# Patient Record
Sex: Female | Born: 1970 | Race: Black or African American | Hispanic: No | Marital: Single | Smoking: Never smoker
Health system: Southern US, Community
[De-identification: ages and names within clinical notes are randomized; demographics above are authoritative.]

## PROBLEM LIST (undated history)

## (undated) DIAGNOSIS — R054 Cough syncope: Secondary | ICD-10-CM

## (undated) DIAGNOSIS — I10 Essential (primary) hypertension: Secondary | ICD-10-CM

## (undated) DIAGNOSIS — G4730 Sleep apnea, unspecified: Secondary | ICD-10-CM

## (undated) DIAGNOSIS — E785 Hyperlipidemia, unspecified: Secondary | ICD-10-CM

## (undated) DIAGNOSIS — E119 Type 2 diabetes mellitus without complications: Secondary | ICD-10-CM

## (undated) DIAGNOSIS — R05 Cough: Secondary | ICD-10-CM

## (undated) DIAGNOSIS — D649 Anemia, unspecified: Secondary | ICD-10-CM

## (undated) DIAGNOSIS — R55 Syncope and collapse: Secondary | ICD-10-CM

## (undated) HISTORY — DX: Sleep apnea, unspecified: G47.30

## (undated) HISTORY — DX: Morbid (severe) obesity due to excess calories: E66.01

## (undated) HISTORY — PX: BREAST SURGERY: SHX581

## (undated) HISTORY — DX: Essential (primary) hypertension: I10

## (undated) HISTORY — PX: CHOLECYSTECTOMY: SHX55

## (undated) HISTORY — PX: ABDOMINAL HYSTERECTOMY: SHX81

## (undated) HISTORY — DX: Type 2 diabetes mellitus without complications: E11.9

## (undated) HISTORY — DX: Syncope and collapse: R55

## (undated) HISTORY — DX: Cough syncope: R05.4

## (undated) HISTORY — DX: Hyperlipidemia, unspecified: E78.5

## (undated) HISTORY — DX: Anemia, unspecified: D64.9

---
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
## 2002-03-02 ENCOUNTER — Emergency Department (HOSPITAL_COMMUNITY): Admission: EM | Admit: 2002-03-02 | Discharge: 2002-03-02 | Payer: Self-pay | Admitting: Emergency Medicine

## 2002-06-18 ENCOUNTER — Encounter: Payer: Self-pay | Admitting: Emergency Medicine

## 2002-06-18 ENCOUNTER — Emergency Department (HOSPITAL_COMMUNITY): Admission: EM | Admit: 2002-06-18 | Discharge: 2002-06-18 | Payer: Self-pay | Admitting: Emergency Medicine

## 2005-01-13 ENCOUNTER — Ambulatory Visit: Payer: Self-pay | Admitting: Internal Medicine

## 2005-03-28 ENCOUNTER — Emergency Department (HOSPITAL_COMMUNITY): Admission: EM | Admit: 2005-03-28 | Discharge: 2005-03-29 | Payer: Self-pay | Admitting: Emergency Medicine

## 2007-08-17 ENCOUNTER — Emergency Department (HOSPITAL_COMMUNITY): Admission: EM | Admit: 2007-08-17 | Discharge: 2007-08-17 | Payer: Self-pay | Admitting: Family Medicine

## 2007-10-12 ENCOUNTER — Ambulatory Visit: Payer: Self-pay | Admitting: *Deleted

## 2008-01-28 ENCOUNTER — Emergency Department (HOSPITAL_COMMUNITY): Admission: EM | Admit: 2008-01-28 | Discharge: 2008-01-29 | Payer: Self-pay | Admitting: Emergency Medicine

## 2010-03-02 ENCOUNTER — Emergency Department (HOSPITAL_COMMUNITY): Admission: EM | Admit: 2010-03-02 | Discharge: 2010-03-02 | Payer: Self-pay | Admitting: Family Medicine

## 2011-03-11 LAB — INFLUENZA A AND B ANTIGEN (CONVERTED LAB)
Inflenza A Ag: NEGATIVE
Influenza B Ag: NEGATIVE

## 2011-03-11 LAB — POCT RAPID STREP A: Streptococcus, Group A Screen (Direct): NEGATIVE

## 2011-05-31 ENCOUNTER — Other Ambulatory Visit: Payer: Self-pay | Admitting: Family Medicine

## 2011-05-31 DIAGNOSIS — R1900 Intra-abdominal and pelvic swelling, mass and lump, unspecified site: Secondary | ICD-10-CM

## 2011-06-03 ENCOUNTER — Ambulatory Visit
Admission: RE | Admit: 2011-06-03 | Discharge: 2011-06-03 | Disposition: A | Discharge status: Home / Self Care | Payer: BC Managed Care – PPO | Source: Ambulatory Visit | Attending: Family Medicine | Admitting: Family Medicine

## 2011-06-03 DIAGNOSIS — R1900 Intra-abdominal and pelvic swelling, mass and lump, unspecified site: Secondary | ICD-10-CM

## 2011-08-01 ENCOUNTER — Other Ambulatory Visit (HOSPITAL_COMMUNITY)
Admission: RE | Admit: 2011-08-01 | Discharge: 2011-08-01 | Disposition: A | Discharge status: Home / Self Care | Payer: BC Managed Care – PPO | Source: Ambulatory Visit | Attending: Obstetrics and Gynecology | Admitting: Obstetrics and Gynecology

## 2011-08-01 DIAGNOSIS — Z01419 Encounter for gynecological examination (general) (routine) without abnormal findings: Secondary | ICD-10-CM | POA: Insufficient documentation

## 2012-08-15 DIAGNOSIS — D259 Leiomyoma of uterus, unspecified: Secondary | ICD-10-CM | POA: Insufficient documentation

## 2012-08-15 DIAGNOSIS — Z531 Procedure and treatment not carried out because of patient's decision for reasons of belief and group pressure: Secondary | ICD-10-CM | POA: Insufficient documentation

## 2012-08-15 DIAGNOSIS — IMO0001 Reserved for inherently not codable concepts without codable children: Secondary | ICD-10-CM | POA: Insufficient documentation

## 2012-08-15 DIAGNOSIS — N920 Excessive and frequent menstruation with regular cycle: Secondary | ICD-10-CM | POA: Insufficient documentation

## 2012-08-15 DIAGNOSIS — D649 Anemia, unspecified: Secondary | ICD-10-CM | POA: Insufficient documentation

## 2012-09-24 ENCOUNTER — Other Ambulatory Visit: Payer: Self-pay | Admitting: Obstetrics and Gynecology

## 2012-09-24 ENCOUNTER — Other Ambulatory Visit (HOSPITAL_COMMUNITY)
Admission: RE | Admit: 2012-09-24 | Discharge: 2012-09-24 | Disposition: A | Discharge status: Home / Self Care | Payer: BC Managed Care – PPO | Source: Ambulatory Visit | Attending: Obstetrics and Gynecology | Admitting: Obstetrics and Gynecology

## 2012-09-24 DIAGNOSIS — Z01419 Encounter for gynecological examination (general) (routine) without abnormal findings: Secondary | ICD-10-CM | POA: Insufficient documentation

## 2012-09-24 DIAGNOSIS — Z1151 Encounter for screening for human papillomavirus (HPV): Secondary | ICD-10-CM | POA: Insufficient documentation

## 2012-12-20 ENCOUNTER — Telehealth: Payer: Self-pay | Admitting: Hematology and Oncology

## 2012-12-20 NOTE — Telephone Encounter (Signed)
S/W PT IN RE TO NP APPT 07/30 @ 1:30 W/DR. [place] REFERRING DR. Dion Body DX- IDA WELCOME PACKET MAILED.   REFERRING OFFICE IS AWARE OF APPT.

## 2012-12-24 ENCOUNTER — Telehealth: Payer: Self-pay | Admitting: Hematology and Oncology

## 2012-12-24 NOTE — Telephone Encounter (Signed)
C/D 12/24/12 for appt. 01/16/13

## 2013-01-15 ENCOUNTER — Other Ambulatory Visit: Payer: Self-pay

## 2013-01-15 DIAGNOSIS — D649 Anemia, unspecified: Secondary | ICD-10-CM

## 2013-01-16 ENCOUNTER — Telehealth: Payer: Self-pay | Admitting: *Deleted

## 2013-01-16 ENCOUNTER — Ambulatory Visit: Payer: BC Managed Care – PPO

## 2013-01-16 ENCOUNTER — Ambulatory Visit: Payer: BC Managed Care – PPO | Admitting: Lab

## 2013-01-16 ENCOUNTER — Ambulatory Visit (HOSPITAL_BASED_OUTPATIENT_CLINIC_OR_DEPARTMENT_OTHER): Payer: BC Managed Care – PPO | Admitting: Hematology and Oncology

## 2013-01-16 ENCOUNTER — Other Ambulatory Visit: Payer: BC Managed Care – PPO | Admitting: Lab

## 2013-01-16 ENCOUNTER — Telehealth: Payer: Self-pay

## 2013-01-16 ENCOUNTER — Other Ambulatory Visit (HOSPITAL_BASED_OUTPATIENT_CLINIC_OR_DEPARTMENT_OTHER): Payer: BC Managed Care – PPO | Admitting: Lab

## 2013-01-16 VITALS — BP 163/94 | HR 69 | Temp 98.1°F | Resp 18 | Ht 66.0 in | Wt 203.7 lb

## 2013-01-16 DIAGNOSIS — D649 Anemia, unspecified: Secondary | ICD-10-CM

## 2013-01-16 LAB — TSH CHCC: TSH: 1.017 m(IU)/L (ref 0.308–3.960)

## 2013-01-16 LAB — CBC WITH DIFFERENTIAL/PLATELET
Basophils Absolute: 0 10*3/uL (ref 0.0–0.1)
EOS%: 1.4 % (ref 0.0–7.0)
HCT: 35.1 % (ref 34.8–46.6)
HGB: 10.6 g/dL — ABNORMAL LOW (ref 11.6–15.9)
LYMPH%: 41.1 % (ref 14.0–49.7)
MCH: 22.8 pg — ABNORMAL LOW (ref 25.1–34.0)
MCV: 75.6 fL — ABNORMAL LOW (ref 79.5–101.0)
MONO%: 7.6 % (ref 0.0–14.0)
NEUT%: 49.3 % (ref 38.4–76.8)
Platelets: 341 10*3/uL (ref 145–400)

## 2013-01-16 LAB — COMPREHENSIVE METABOLIC PANEL (CC13)
ALT: 7 U/L (ref 0–55)
CO2: 25 mEq/L (ref 22–29)
Calcium: 9.1 mg/dL (ref 8.4–10.4)
Chloride: 108 mEq/L (ref 98–109)
Sodium: 141 mEq/L (ref 136–145)
Total Protein: 7.6 g/dL (ref 6.4–8.3)

## 2013-01-16 LAB — RETICULOCYTES: RBC: 4.6 10*6/uL (ref 3.70–5.45)

## 2013-01-16 LAB — T4, FREE: Free T4: 1.09 ng/dL (ref 0.80–1.80)

## 2013-01-16 NOTE — Telephone Encounter (Signed)
appts made and printed. Pt is aware on 01/30/13 she will have an tx after seeing the doctor. i emailed MW to add the tx...td

## 2013-01-16 NOTE — Progress Notes (Signed)
[facility]  Telephone:([phone] Fax:(336) [phone]   MEDICAL ONCOLOGY - INITIAL CONSULATION    Referral MD: Anemia    No chief complaint on file. :Anemia  HPI:  We have the pleasure of seeing Audrey Avery in our clinic today for consultation regarding her microcytic anemia. Audrey Avery is a pleasant 42 years old, Jehovah's Witness African American female with normal significant medical history except for heavy menses which has been getting worse lately due to worsening fibroid. Patient says that she has been having excessive amounts of vaginal bleeding during her menses, the duration is getting more and more prolonged now. Patient stated that his feeding tired and fatigue. She has been on Integra once a day. Audrey Avery underwent a vaginal ultrasound which should multiple uterine fibroids some of which, were growing in size.    History reviewed. No pertinent past medical history.:  History reviewed. No pertinent past surgical history.:  Current Outpatient Prescriptions  Medication Sig Dispense Refill  . acetaminophen (TYLENOL) 500 MG tablet Take 500 mg by mouth. Prn for abd cramps      . Cholecalciferol (VITAMIN D3) 2000 UNITS CHEW Chew 2 tablets by mouth daily. gummies      . Fe Fum-FePoly-Vit C-Vit B3 (INTEGRA PO) Take 1 tablet by mouth daily.      Marland Kitchen leuprolide (LUPRON) 11.25 MG injection Inject 11.25 mg into the muscle every 3 (three) months. Last in May      . lisinopril (PRINIVIL,ZESTRIL) 20 MG tablet Take 20 mg by mouth daily.       No current facility-administered medications for this visit.     Allergies  Allergen Reactions  . Penicillins Swelling    Throat swelling per mother  :  History reviewed. No pertinent family history.:  History   Social History  . Marital Status: Single    Spouse Name: N/A    Number of Children: N/A  . Years of Education: N/A   Occupational History  . Not on file.   Social History Main Topics  . Smoking  status: Current Some Day Smoker  . Smokeless tobacco: Not on file  . Alcohol Use: No  . Drug Use: No  . Sexually Active: Not on file   Other Topics Concern  . Not on file   Social History Narrative  . No narrative on file  :  Pertinent items are noted in HPI.  Exam: Blood pressure 163/94, pulse 69, temperature 98.1 F (36.7 C), temperature source Oral, resp. rate 18, height 5\' 6"  (1.676 m), weight 203 lb 11.2 oz (92.398 kg).   ECOG PERFORMANCE STATUS: 1 - Symptomatic but completely ambulatory  GENERAL: No distress, well nourished.  SKIN:  No rashes or significant lesions  HEAD: Normocephalic, No masses, lesions, tenderness or abnormalities  EYES: Conjunctiva are pink and non-injected  ENT: External ears normal ,lips , buccal mucosa, and tongue normal and mucous membranes are moist  LYMPH: No palpable lymphadenopathy  BREAST:Normal without mass, skin or nipple changes or axillary nodes,  LUNGS: Clear to auscultation, no crackles or wheezes HEART: Regular rate & rhythm, no murmurs, no gallops, S1 normal and S2 normal  ABDOMEN: Abdomen soft, non-tender, normal bowel sounds, no masses or organomegaly and no hepatosplenomegaly  MSK: No CVA tenderness and no tenderness on percussion of the back or rib cage. EXTREMITIES: No edema, no skin discoloration or tenderness NEURO: Alert & oriented, no focal motor/sensory deficits.  Lab Results  Component Value Date   WBC 4.9 01/16/2013  HGB 10.6 Repeated and Verified* 01/16/2013   HCT 35.1 01/16/2013   PLT 341 01/16/2013    No results found.   results found.  Assessment and Plan:  Audrey Avery is 42 years old African female with anemia, most likely secondary to excessive menses bleeding. Patient informed me that her gynecologist is planning on performing hysterectomy. Today in the clinic with patient we had a long discussion about her disease, we will follow Audrey Avery that most likely the reason for her anemia is the excessive  menstrual bleeding. Went from a patient that the first step would be checked your iron profile as well as ferritin and a she is iron deficient, which was likely would be the case given your history, we could arrange for IV iron, then she can be placed on oral iron supplementation. However, until the underlying problem i.e. the fibroid, is taking care of it will be challenging to keep up with the iron loss. Therefore we agreed with her gynecologist that some kind of measure has to be thinking for local control of her breathing. Today as mentioned above we will check your iron profile, ferritin, CBC, TSH and free T4. We will schedule patient to return to our clinic in 2 weeks to discuss the results and plan for the iron infusion. Patient knows to call us in the interim if any issues or problems arise.    Patient asked a series of questions which was answered hopefully to her staisfication.   Thank you very much for the consult   Zachery Dakins, MD 01/16/2013 2:44 PM

## 2013-01-16 NOTE — Telephone Encounter (Signed)
Pt called and said she cannot afford many $45 copays. She has an appt on 8/13 and will plan on receiving IV iron at that time to consolidate the visit.

## 2013-01-25 ENCOUNTER — Telehealth: Payer: Self-pay | Admitting: Hematology and Oncology

## 2013-01-25 NOTE — Telephone Encounter (Signed)
Moved 8/13 appt from CP1 to CP2. CP1 out. S/w pt re new time for 8/13 @ 2:30pm.

## 2013-01-30 ENCOUNTER — Ambulatory Visit (HOSPITAL_BASED_OUTPATIENT_CLINIC_OR_DEPARTMENT_OTHER): Payer: BC Managed Care – PPO | Admitting: Hematology and Oncology

## 2013-01-30 ENCOUNTER — Ambulatory Visit: Payer: BC Managed Care – PPO

## 2013-01-30 ENCOUNTER — Other Ambulatory Visit: Payer: BC Managed Care – PPO | Admitting: Lab

## 2013-01-30 ENCOUNTER — Telehealth: Payer: Self-pay | Admitting: Hematology and Oncology

## 2013-01-30 VITALS — BP 155/102 | HR 77 | Temp 98.5°F | Resp 18 | Ht 66.0 in | Wt 205.2 lb

## 2013-01-30 DIAGNOSIS — N920 Excessive and frequent menstruation with regular cycle: Secondary | ICD-10-CM

## 2013-01-30 DIAGNOSIS — D500 Iron deficiency anemia secondary to blood loss (chronic): Secondary | ICD-10-CM

## 2013-01-30 DIAGNOSIS — Z832 Family history of diseases of the blood and blood-forming organs and certain disorders involving the immune mechanism: Secondary | ICD-10-CM

## 2013-01-30 DIAGNOSIS — D649 Anemia, unspecified: Secondary | ICD-10-CM

## 2013-01-30 MED ORDER — FERROUS FUMARATE 324 MG PO TABS: 1.0000 | ORAL_TABLET | Freq: Two times a day (BID) | ORAL | Status: DC

## 2013-01-30 NOTE — Telephone Encounter (Signed)
Gave pt appt for lab on 8/15 and MD August 2014

## 2013-02-01 ENCOUNTER — Other Ambulatory Visit (HOSPITAL_BASED_OUTPATIENT_CLINIC_OR_DEPARTMENT_OTHER): Payer: BC Managed Care – PPO | Admitting: Lab

## 2013-02-01 ENCOUNTER — Other Ambulatory Visit: Payer: Self-pay | Admitting: Hematology and Oncology

## 2013-02-01 DIAGNOSIS — D649 Anemia, unspecified: Secondary | ICD-10-CM

## 2013-02-05 LAB — HEMOGLOBINOPATHY EVALUATION
Hgb F Quant: 0 % (ref 0.0–2.0)
Hgb S Quant: 0 %

## 2013-02-14 ENCOUNTER — Ambulatory Visit (HOSPITAL_BASED_OUTPATIENT_CLINIC_OR_DEPARTMENT_OTHER): Payer: BC Managed Care – PPO | Admitting: Hematology and Oncology

## 2013-02-14 ENCOUNTER — Ambulatory Visit (HOSPITAL_BASED_OUTPATIENT_CLINIC_OR_DEPARTMENT_OTHER): Payer: BC Managed Care – PPO | Admitting: Lab

## 2013-02-14 VITALS — BP 146/94 | HR 85 | Temp 98.1°F | Resp 18 | Ht 66.0 in | Wt 209.9 lb

## 2013-02-14 DIAGNOSIS — D509 Iron deficiency anemia, unspecified: Secondary | ICD-10-CM

## 2013-02-14 DIAGNOSIS — D649 Anemia, unspecified: Secondary | ICD-10-CM

## 2013-02-14 LAB — CBC WITH DIFFERENTIAL/PLATELET
Basophils Absolute: 0.1 10*3/uL (ref 0.0–0.1)
EOS%: 1.8 % (ref 0.0–7.0)
HGB: 9.6 g/dL — ABNORMAL LOW (ref 11.6–15.9)
MCH: 23.1 pg — ABNORMAL LOW (ref 25.1–34.0)
NEUT#: 2.7 10*3/uL (ref 1.5–6.5)
RDW: 17.8 % — ABNORMAL HIGH (ref 11.2–14.5)
lymph#: 2.3 10*3/uL (ref 0.9–3.3)

## 2013-02-15 ENCOUNTER — Telehealth: Payer: Self-pay | Admitting: Hematology and Oncology

## 2013-02-15 ENCOUNTER — Telehealth: Payer: Self-pay | Admitting: *Deleted

## 2013-02-15 LAB — IRON AND TIBC CHCC
Iron: 91 ug/dL (ref 41–142)
TIBC: 278 ug/dL (ref 236–444)

## 2013-02-15 LAB — FERRITIN CHCC: Ferritin: 15 ng/ml (ref 9–269)

## 2013-02-15 NOTE — Telephone Encounter (Signed)
lvm for pt and advised on 9.5.14 appt and 11.24.14 appt...mailed pt appt sched and avs with letter

## 2013-02-15 NOTE — Telephone Encounter (Signed)
Per staff message and POF I have scheduled appts.  JMW  

## 2013-02-22 ENCOUNTER — Ambulatory Visit: Payer: BC Managed Care – PPO

## 2013-02-24 DIAGNOSIS — D649 Anemia, unspecified: Secondary | ICD-10-CM | POA: Insufficient documentation

## 2013-02-24 NOTE — Progress Notes (Signed)
ID: Audrey Avery OB: [birth year]  MR#: [id number]  JYN#:[id number]  [facility]  [phone]) [phone] Fax:(336) [phone]   OFFICE PROGRESS NOTE  PCP: REDMON,NOELLE, PA-C   DIAGNOSIS: Anemia   HISTORY OF PRESENT ILLNESS: Audrey Avery was seen in our clinic on 01/16/13 for consultation regarding her microcytic anemia. Audrey Avery is a pleasant 42 years old, Jehovah's Witness African American female with normal significant medical history except for heavy menses which has been getting worse lately due to worsening fibroid. Patient said that she has been having excessive amounts of vaginal bleeding during her menses, the duration is getting more and more prolonged now. Patient stated that his feeding tired and fatigue. She has been on Integra once a day. Audrey Avery underwent a vaginal ultrasound which should multiple uterine fibroids some of which, were growing in size.  INTERVAL HISTORY: The patient is a 42 y.o. Who presented for follow up visit.She is doing well Her appetite is good and she gain some weight. Occasionally she has chills. She reported mild night sweats and stable nasal congestion. The patient denied fever. She denied headaches, double vision, blurry vision,  nasal discharge, hearing problems, odynophagia or dysphagia. No chest pain, palpitations, dyspnea, cough, abdominal pain, nausea, vomiting, diarrhea, constipation, hematochezia. The patient denied dysuria, nocturia, polyuria, hematuria, myalgia, numbness, tingling, psychiatric problems.  Review of Systems  Constitutional: Positive for chills. Negative for fever, weight loss, malaise/fatigue and diaphoresis.  HENT: Positive for congestion. Negative for hearing loss, nosebleeds, sore throat, neck pain and tinnitus.   Eyes: Negative for blurred vision, double vision, photophobia, pain and discharge.  Respiratory: Negative for cough, hemoptysis, sputum production, shortness of breath and wheezing.     Cardiovascular: Negative for chest pain, palpitations, orthopnea, claudication, leg swelling and PND.  Gastrointestinal: Negative for heartburn, nausea, vomiting, abdominal pain, diarrhea, constipation, blood in stool and melena.  Genitourinary: Negative for dysuria, urgency, frequency, hematuria and flank pain.  Musculoskeletal: Negative for myalgias, back pain and joint pain.  Skin: Negative for itching and rash.  Neurological: Negative for dizziness, tingling, tremors, sensory change, speech change, focal weakness, seizures, loss of consciousness, weakness and headaches.  Endo/Heme/Allergies: Does not bruise/bleed easily.  Psychiatric/Behavioral: Negative.     PAST MEDICAL HISTORY: Anemia  PAST SURGICAL HISTORY: No past surgical history on file.  FAMILY HISTORY No family history on file. Father has sickle cell trait.  HEALTH MAINTENANCE: History  Substance Use Topics  . Smoking status: Current Some Day Smoker  . Smokeless tobacco: Not on file  . Alcohol Use: No    Allergies  Allergen Reactions  . Penicillins Swelling    Throat swelling per mother     Current Outpatient Prescriptions   Medication  Sig  Dispense  Refill   .  acetaminophen (TYLENOL) 500 MG tablet  Take 500 mg by mouth. Prn for abd cramps     .  Cholecalciferol (VITAMIN D3) 2000 UNITS CHEW  Chew 2 tablets by mouth daily. gummies     .  Fe Fum-FePoly-Vit C-Vit B3 (INTEGRA PO)  Take 1 tablet by mouth daily.     Marland Kitchen  leuprolide (LUPRON) 11.25 MG injection  Inject 11.25 mg into the muscle every 3 (three) months. Last in May     .  lisinopril (PRINIVIL,ZESTRIL) 20 MG tablet  Take 20 mg by mouth daily.      No current facility-administered medications for this visit.       OBJECTIVE: Filed Vitals:   01/30/13 1459  BP: 155/102  Pulse: 77  Temp: 98.5 F (36.9 C)  Resp: 18     Body mass index is 33.14 kg/(m^2).    ECOG FS: 0  PHYSICAL EXAMINATION:  HEENT: Sclerae anicteric.  Conjunctivae were pink.  Pupils round and reactive bilaterally. Oral mucosa is moist without ulceration or thrush. No occipital, submandibular, cervical, supraclavicular or axillar adenopathy. Lungs: clear to auscultation without wheezes. No rales or rhonchi. Heart: regular rate and rhythm. No murmur, gallop or rubs. Abdomen: soft, non tender. No guarding or rebound tenderness. Bowel sounds are present. No palpable hepatosplenomegaly. MSK: no focal spinal tenderness. Extremities: No clubbing or cyanosis.No calf tenderness to palpitation, no peripheral edema. The patient had grossly intact strength in upper and lower extremities. Skin exam was without ecchymosis, petechiae. Neuro: non-focal, alert and oriented to time, person and place, appropriate affect  LAB RESULTS:  CMP     Component Value Date/Time   NA 141 01/16/2013 1500   K 4.1 01/16/2013 1500   CO2 25 01/16/2013 1500   GLUCOSE 90 01/16/2013 1500   BUN 11.0 01/16/2013 1500   CREATININE 0.8 01/16/2013 1500   CALCIUM 9.1 01/16/2013 1500   PROT 7.6 01/16/2013 1500   ALBUMIN 3.2* 01/16/2013 1500   AST 13 01/16/2013 1500   ALT 7 01/16/2013 1500   ALKPHOS 51 01/16/2013 1500   BILITOT <0.20 01/16/2013 1500    Lab Results  Component Value Date   WBC 5.7 02/14/2013   NEUTROABS 2.7 02/14/2013   HGB 9.6* 02/14/2013   HCT 30.6* 02/14/2013   MCV 73.9* 02/14/2013   PLT 231 02/14/2013      Chemistry      Component Value Date/Time   NA 141 01/16/2013 1500   K 4.1 01/16/2013 1500   CO2 25 01/16/2013 1500   BUN 11.0 01/16/2013 1500   CREATININE 0.8 01/16/2013 1500      Component Value Date/Time   CALCIUM 9.1 01/16/2013 1500   ALKPHOS 51 01/16/2013 1500   AST 13 01/16/2013 1500   ALT 7 01/16/2013 1500   BILITOT <0.20 01/16/2013 1500      STUDIES: No results found.  ASSESSMENT AND PLAN: Audrey Avery is 42 years old African female with iron deficiency anemia, secondary to excessive menses bleeding. Patient informed me that her gynecologist is planning on performing  hysterectomy in September. I discussed with patient that she is definitively has iron deficiency anemia which require treatment with iron preparation. I offer patient to take ferrous fumarate  324 mg bid or Ferrous gluconate 325 mg 3 tablets BID. It is possible that patient can have combine amenia and normalization of serum iron not completely fix her anemia. I will order Hgb electrophoresis to rule out hemoglobinopathy (father has sickle cell trait). - CBC with retic and iron study  In 2 weeks      Myra Rude, MD   02/01/2013 5:56 AM

## 2013-03-04 NOTE — Progress Notes (Signed)
ID: [name] OB: 1971/02/12  MR#: [id number]  WUJ#:[id number]  [facility]  Telephone:(336) [phone] Fax:(336) [phone]   OFFICE PROGRESS NOTE  PCP: REDMON,NOELLE, PA-C  DIAGNOSIS: Anemia   HISTORY OF PRESENT ILLNESS: [name] was seen  in our clinic 01/16/13 for consultation regarding her microcytic anemia by Dr. Karel Jarvis. [name] is a pleasant 42 years old, Jehovah's Witness African American female with normal significant medical history except for heavy menses which has been getting worse lately due to worsening fibroid. Patient says that she has been having excessive amounts of vaginal bleeding during her menses, the duration is getting more and more prolonged now. Patient stated that his feeding tired and fatigue. She has been on Integra once a day. Ms. Houchins underwent a vaginal ultrasound which should multiple uterine fibroids some of which, were growing in size. She is planning to have surgery next months.  INTERVAL HISTORY: The patient in 42 y.o. Female who presentad for follow up visit. She feels tired. She gains 15 lb.The patient denied fever, chills, night sweats, change in appetite or weight. She denied headaches, double vision, blurry vision, nasal congestion, nasal discharge, hearing problems, odynophagia or dysphagia. No chest pain, palpitations, dyspnea, cough, abdominal pain, nausea, vomiting, diarrhea, constipation, hematochezia. The patient denied dysuria, nocturia, polyuria, hematuria, myalgia, numbness, tingling, psychiatric problems.  Review of Systems  Constitutional: Positive for malaise/fatigue. Negative for fever, chills, weight loss and diaphoresis.  HENT: Positive for congestion. Negative for hearing loss, sore throat, neck pain, tinnitus and ear discharge.   Eyes: Negative for blurred vision, double vision, photophobia, pain, discharge and redness.  Respiratory: Negative for cough, hemoptysis, sputum production, shortness of breath and  wheezing.   Cardiovascular: Negative for chest pain, palpitations, orthopnea, claudication, leg swelling and PND.  Gastrointestinal: Negative for heartburn, nausea, vomiting, abdominal pain, diarrhea, constipation, blood in stool and melena.  Genitourinary: Negative for dysuria, urgency, frequency, hematuria and flank pain.  Musculoskeletal: Negative for myalgias, back pain, joint pain and falls.  Skin: Negative for itching and rash.  Neurological: Negative for dizziness, tingling, tremors, sensory change, speech change, focal weakness, seizures, loss of consciousness, weakness and headaches.  Endo/Heme/Allergies: Does not bruise/bleed easily.  Psychiatric/Behavioral: Negative.     PAST MEDICAL HISTORY: No past medical history on file.  PAST SURGICAL HISTORY: No past surgical history on file.  FAMILY HISTORY No family history on file.   HEALTH MAINTENANCE: History  Substance Use Topics  . Smoking status: Current Some Day Smoker  . Smokeless tobacco: Not on file  . Alcohol Use: No    Allergies  Allergen Reactions  . Penicillins Swelling    Throat swelling per mother    Current Outpatient Prescriptions  Medication Sig Dispense Refill  . acetaminophen (TYLENOL) 500 MG tablet Take 500 mg by mouth. Prn for abd cramps      . Cholecalciferol (VITAMIN D3) 2000 UNITS CHEW Chew 2 tablets by mouth daily. gummies      . ferrous gluconate (FERGON) 325 MG tablet Take 325 mg by mouth daily with breakfast. 3 pills twice daily      . leuprolide (LUPRON) 11.25 MG injection Inject 11.25 mg into the muscle every 3 (three) months. Last in May      . lisinopril (PRINIVIL,ZESTRIL) 20 MG tablet Take 20 mg by mouth daily.       No current facility-administered medications for this visit.    OBJECTIVE: Filed Vitals:   02/14/13 1445  BP: 146/94  Pulse: 85  Temp: 98.1  F (36.7 C)  Resp: 18     Body mass index is 33.89 kg/(m^2).    ECOG FS:0  PHYSICAL EXAMINATION:  HEENT: Sclerae  anicteric.  Conjunctivae were pink. Pupils round and reactive bilaterally. Oral mucosa is moist without ulceration or thrush. No occipital, submandibular, cervical, supraclavicular or axillar adenopathy. Lungs: clear to auscultation without wheezes. No rales or rhonchi. Heart: regular rate and rhythm. No murmur, gallop or rubs. Abdomen: soft, non tender. No guarding or rebound tenderness. Bowel sounds are present. No palpable hepatosplenomegaly. MSK: no focal spinal tenderness. Extremities: No clubbing or cyanosis.No calf tenderness to palpitation, no peripheral edema. The patient had grossly intact strength in upper and lower extremities. Skin exam was without ecchymosis, petechiae. Neuro: non-focal, alert and oriented to time, person and place, appropriate affect  LAB RESULTS:  CMP     Component Value Date/Time   NA 141 01/16/2013 1500   K 4.1 01/16/2013 1500   CO2 25 01/16/2013 1500   GLUCOSE 90 01/16/2013 1500   BUN 11.0 01/16/2013 1500   CREATININE 0.8 01/16/2013 1500   CALCIUM 9.1 01/16/2013 1500   PROT 7.6 01/16/2013 1500   ALBUMIN 3.2* 01/16/2013 1500   AST 13 01/16/2013 1500   ALT 7 01/16/2013 1500   ALKPHOS 51 01/16/2013 1500   BILITOT <0.20 01/16/2013 1500    Lab Results  Component Value Date   WBC 5.7 02/14/2013   NEUTROABS 2.7 02/14/2013   HGB 9.6* 02/14/2013   HCT 30.6* 02/14/2013   MCV 73.9* 02/14/2013   PLT 231 02/14/2013      Chemistry      Component Value Date/Time   NA 141 01/16/2013 1500   K 4.1 01/16/2013 1500   CO2 25 01/16/2013 1500   BUN 11.0 01/16/2013 1500   CREATININE 0.8 01/16/2013 1500      Component Value Date/Time   CALCIUM 9.1 01/16/2013 1500   ALKPHOS 51 01/16/2013 1500   AST 13 01/16/2013 1500   ALT 7 01/16/2013 1500   BILITOT <0.20 01/16/2013 1500       STUDIES: No results found.  ASSESSMENT AND PLAN: 1. Iron deficiency anemia. Partially treated. I recommended to start ferrous gluconate  325 mg 6 tabs a day or ferrous fumarate.325 mg po  bid. Patient continue to be anemic. I will arrange one dose of Feraheme. Patient should continue to take po ferrous gluconate.She will proceed with hysterectomy in September. 2. Follow up in 3 months  Mason Dibiasio, MD   03/04/2013 2:10 AM

## 2013-05-10 ENCOUNTER — Other Ambulatory Visit: Payer: Self-pay | Admitting: Hematology and Oncology

## 2013-05-10 DIAGNOSIS — D649 Anemia, unspecified: Secondary | ICD-10-CM

## 2013-05-13 ENCOUNTER — Encounter: Payer: Self-pay | Admitting: *Deleted

## 2013-05-13 ENCOUNTER — Other Ambulatory Visit: Payer: BC Managed Care – PPO | Admitting: Lab

## 2013-05-13 ENCOUNTER — Ambulatory Visit: Payer: BC Managed Care – PPO | Admitting: Hematology and Oncology

## 2015-06-30 ENCOUNTER — Other Ambulatory Visit: Payer: Self-pay

## 2015-06-30 DIAGNOSIS — Z1231 Encounter for screening mammogram for malignant neoplasm of breast: Secondary | ICD-10-CM

## 2015-07-03 ENCOUNTER — Ambulatory Visit
Admission: RE | Admit: 2015-07-03 | Discharge: 2015-07-03 | Disposition: A | Discharge status: Home / Self Care | Payer: BLUE CROSS/BLUE SHIELD | Source: Ambulatory Visit

## 2015-07-03 DIAGNOSIS — Z1231 Encounter for screening mammogram for malignant neoplasm of breast: Secondary | ICD-10-CM

## 2016-09-20 ENCOUNTER — Ambulatory Visit (INDEPENDENT_AMBULATORY_CARE_PROVIDER_SITE_OTHER): Payer: BLUE CROSS/BLUE SHIELD | Admitting: Physician Assistant

## 2016-09-20 VITALS — BP 126/79 | HR 79 | Temp 99.2°F | Resp 16 | Ht 66.0 in | Wt 243.8 lb

## 2016-09-20 DIAGNOSIS — R51 Headache: Secondary | ICD-10-CM | POA: Diagnosis not present

## 2016-09-20 DIAGNOSIS — Z130 Encounter for screening for diseases of the blood and blood-forming organs and certain disorders involving the immune mechanism: Secondary | ICD-10-CM

## 2016-09-20 DIAGNOSIS — R519 Headache, unspecified: Secondary | ICD-10-CM

## 2016-09-20 DIAGNOSIS — Z13228 Encounter for screening for other metabolic disorders: Secondary | ICD-10-CM

## 2016-09-20 DIAGNOSIS — Z131 Encounter for screening for diabetes mellitus: Secondary | ICD-10-CM

## 2016-09-20 DIAGNOSIS — Z1329 Encounter for screening for other suspected endocrine disorder: Secondary | ICD-10-CM

## 2016-09-20 DIAGNOSIS — I10 Essential (primary) hypertension: Secondary | ICD-10-CM | POA: Diagnosis not present

## 2016-09-20 MED ORDER — IBUPROFEN 600 MG PO TABS: 600.0000 mg | ORAL_TABLET | Freq: Three times a day (TID) | ORAL | 1 refills | Status: DC | PRN

## 2016-09-20 MED ORDER — LISINOPRIL 20 MG PO TABS: 20.0000 mg | ORAL_TABLET | Freq: Every day | ORAL | 3 refills | Status: DC

## 2016-09-20 NOTE — Patient Instructions (Addendum)
Check your BP at home daily for 1 month and keep a blood pressure diary.  Bring the numbers back to me in one month.   IF you received an x-ray today, you will receive an invoice from [facility]. Please contact [facility] at [phone] with questions or concerns regarding your invoice.   IF you received labwork today, you will receive an invoice from [facility]. Please contact [facility] at [phone] with questions or concerns regarding your invoice.   Our billing staff will not be able to assist you with questions regarding bills from these companies.  You will be contacted with the lab results as soon as they are available. The fastest way to get your results is to activate your My Chart account. Instructions are located on the last page of this paperwork. If you have not heard from [place] regarding the results in 2 weeks, please contact this office.

## 2016-09-20 NOTE — Progress Notes (Signed)
09/20/2016 3:06 PM   DOB: 1970-10-13 / MRN: [id number]  SUBJECTIVE:  Audrey Avery is a 46 y.o. female presenting for persistent HA.  Tells me these are migraines that cause her to stay in bed, however says that she is still functional.  Tells me the HA are parietal in location. Has tried amlodipine and this does help the HA and keep them away. Denies nausea with the HA.  Does complain of photophobia and phonophobia. She does not check her BP.    She was taking medication for prediabetes. She was taking norvasc five for the control of HTN. She does not check her BP at home.   She is allergic to penicillins.   She  has a past medical history of Anemia and Hypertension.    She  reports that she has never smoked. She has never used smokeless tobacco. She reports that she does not drink alcohol or use drugs. She  has no sexual activity history on file. The patient  has a past surgical history that includes Cesarean section; Abdominal hysterectomy; Cholecystectomy; and Breast surgery.  Her family history includes Cancer in her father, mother, and sister; Diabetes in her father; Hypertension in her father, mother, and sister.  Review of Systems  Constitutional: Negative for diaphoresis.  Eyes: Negative.   Respiratory: Negative for cough, hemoptysis, sputum production, shortness of breath and wheezing.   Cardiovascular: Negative for chest pain, orthopnea and leg swelling.  Gastrointestinal: Negative for nausea.  Neurological: Positive for headaches. Negative for dizziness, sensory change, speech change and focal weakness.    The problem list and medications were reviewed and updated by myself where necessary and exist elsewhere in the encounter.   OBJECTIVE:  BP 126/79 (BP Location: Right Arm, Patient Position: Sitting, Cuff Size: Large)   Pulse 79   Temp 99.2 F (37.3 C) (Oral)   Resp 16   Ht 5\' 6"  (1.676 m)   Wt 243 lb 12.8 oz (110.6 kg)   SpO2 96%   BMI 39.35 kg/m   Physical  Exam  Constitutional: She is oriented to person, place, and time. She is active.  Non-toxic appearance.  Eyes: EOM are normal. Pupils are equal, round, and reactive to light.  Cardiovascular: Normal rate.   Pulmonary/Chest: Effort normal. No tachypnea.  Neurological: She is alert and oriented to person, place, and time. She has normal strength. She is not disoriented. She displays no atrophy. No cranial nerve deficit or sensory deficit. She exhibits normal muscle tone. Coordination and gait normal.  Skin: Skin is warm and dry. She is not diaphoretic. No pallor.  Psychiatric: Her behavior is normal.   BP Readings from Last 3 Encounters:  09/20/16 126/79  02/14/13 (!) 146/94  01/30/13 (!) 155/102    No results found for this or any previous visit (from the past 72 hour(s)).  No results found.  ASSESSMENT AND PLAN:  Audrey Avery was seen today for medication refill.  Diagnoses and all orders for this visit:  Persistent headaches: Comments: Doubt migraine. The pain is relieved with acetaminophen.  Will provide ibuprofen 600 mg for tension HA. She has a history of HTn but her numbers here today are normal and she is not taking medications. She will check her numbers for one month and RTC for recheck.  Orders: -     CBC -     ibuprofen (ADVIL,MOTRIN) 600 MG tablet; Take 1 tablet (600 mg total) by mouth every 8 (eight) hours as needed for headache.  Screening for diabetes  mellitus -     Hemoglobin A1c  Screening for endocrine, metabolic and immunity disorder -     Basic metabolic panel -     TSH      The patient is advised to call or return to clinic if she does not see an improvement in symptoms, or to seek the care of the closest emergency department if she worsens with the above plan.   Audrey Avery, MHS, PA-C [facility] 09/20/2016 3:06 PM

## 2016-09-21 LAB — CBC
HEMATOCRIT: 37.3 % (ref 34.0–46.6)
Hemoglobin: 11.8 g/dL (ref 11.1–15.9)
MCH: 24.2 pg — ABNORMAL LOW (ref 26.6–33.0)
MCHC: 31.6 g/dL (ref 31.5–35.7)
MCV: 77 fL — ABNORMAL LOW (ref 79–97)
Platelets: 361 10*3/uL (ref 150–379)
RBC: 4.87 x10E6/uL (ref 3.77–5.28)
RDW: 15.9 % — ABNORMAL HIGH (ref 12.3–15.4)
WBC: 6.7 10*3/uL (ref 3.4–10.8)

## 2016-09-21 LAB — BASIC METABOLIC PANEL
BUN / CREAT RATIO: 19 (ref 9–23)
BUN: 11 mg/dL (ref 6–24)
CO2: 26 mmol/L (ref 18–29)
CREATININE: 0.59 mg/dL (ref 0.57–1.00)
Calcium: 9.5 mg/dL (ref 8.7–10.2)
Chloride: 100 mmol/L (ref 96–106)
GFR calc Af Amer: 130 mL/min/{1.73_m2} (ref >59)
GFR calc non Af Amer: 113 mL/min/{1.73_m2} (ref >59)
GLUCOSE: 103 mg/dL — AB (ref 65–99)
Potassium: 4.6 mmol/L (ref 3.5–5.2)
Sodium: 140 mmol/L (ref 134–144)

## 2016-09-21 LAB — HEMOGLOBIN A1C
ESTIMATED AVERAGE GLUCOSE: 140 mg/dL
Hgb A1c MFr Bld: 6.5 % — ABNORMAL HIGH (ref 4.8–5.6)

## 2016-09-21 LAB — TSH: TSH: 1.59 u[IU]/mL (ref 0.450–4.500)

## 2016-09-24 ENCOUNTER — Emergency Department (HOSPITAL_COMMUNITY)
Admission: EM | Admit: 2016-09-24 | Discharge: 2016-09-24 | Disposition: A | Discharge status: Home / Self Care | Payer: BLUE CROSS/BLUE SHIELD | Attending: Emergency Medicine | Admitting: Emergency Medicine

## 2016-09-24 ENCOUNTER — Encounter (HOSPITAL_COMMUNITY): Payer: Self-pay | Admitting: *Deleted

## 2016-09-24 ENCOUNTER — Emergency Department (HOSPITAL_COMMUNITY): Payer: BLUE CROSS/BLUE SHIELD

## 2016-09-24 DIAGNOSIS — Z79899 Other long term (current) drug therapy: Secondary | ICD-10-CM | POA: Insufficient documentation

## 2016-09-24 DIAGNOSIS — I10 Essential (primary) hypertension: Secondary | ICD-10-CM | POA: Insufficient documentation

## 2016-09-24 DIAGNOSIS — R51 Headache: Secondary | ICD-10-CM | POA: Insufficient documentation

## 2016-09-24 DIAGNOSIS — R519 Headache, unspecified: Secondary | ICD-10-CM

## 2016-09-24 DIAGNOSIS — G8929 Other chronic pain: Secondary | ICD-10-CM | POA: Diagnosis not present

## 2016-09-24 NOTE — ED Provider Notes (Signed)
Audrey Avery Provider Note   CSN: [id number] Arrival date & time: 09/24/16  1334     History   Chief Complaint Chief Complaint  Patient presents with  . Dizziness    HPI Audrey Avery is a 46 y.o. female.  HPI 46 y.o. female with a hx of HTN, presents to the Emergency Department today complaining of headache and dizziness x several months. Notes worsening with position and pending over and notes pain on right forehead. Pt states that the pain is intermittent and has been an ongoing discomfort. No numbness/tingling. No N/V. Does endorse double vision intermittently, but none now. Notes photophobia occasionally. Pt went to opthalmologist due to double vision and was unremarkable encounter. Went to PCP and was started on HTN medications, which she no longer takes. Pt went to [facility] recently and was given Rx Motrin with significant improvement, but also does not want to take medications. No fevers. No neck stiffness. No CP/SOB/ABD pain. No other symptoms noted.   Past Medical History:  Diagnosis Date  . Anemia   . Hypertension     Patient Active Problem List   Diagnosis Date Noted  . Anemia, unspecified 02/24/2013    Past Surgical History:  Procedure Laterality Date  . ABDOMINAL HYSTERECTOMY    . BREAST SURGERY    . CESAREAN SECTION    . CHOLECYSTECTOMY      OB History    No data available       Home Medications    Prior to Admission medications   Medication Sig Start Date End Date Taking? Authorizing Provider  ibuprofen (ADVIL,MOTRIN) 600 MG tablet Take 1 tablet (600 mg total) by mouth every 8 (eight) hours as needed for headache. 09/20/16   Tereasa Coop, PA-C    Family History Family History  Problem Relation Age of Onset  . Cancer Mother   . Hypertension Mother   . Diabetes Father   . Cancer Father   . Hypertension Father   . Cancer Sister   . Hypertension Sister     Social History Social History  Substance Use Topics  . Smoking status:  Never Smoker  . Smokeless tobacco: Never Used  . Alcohol use No   Allergies   Penicillins   Review of Systems Review of Systems ROS reviewed and all are negative for acute change except as noted in the HPI.  Physical Exam Updated Vital Signs BP (!) 135/91 (BP Location: Left Arm)   Pulse 85   Temp 98.2 F (36.8 C) (Oral)   Resp 18   Ht 5\' 7"  (1.702 m)   Wt 111.1 kg   SpO2 95%   BMI 38.37 kg/m   Physical Exam  Constitutional: She is oriented to person, place, and time. Vital signs are normal. She appears well-developed and well-nourished. No distress.  HENT:  Head: Normocephalic and atraumatic.  Right Ear: Hearing, tympanic membrane, external ear and ear canal normal.  Left Ear: Hearing, tympanic membrane, external ear and ear canal normal.  Nose: Nose normal.  Mouth/Throat: Uvula is midline, oropharynx is clear and moist and mucous membranes are normal. No trismus in the jaw. No oropharyngeal exudate, posterior oropharyngeal erythema or tonsillar abscesses.  Eyes: Conjunctivae and EOM are normal. Pupils are equal, round, and reactive to light.  Neck: Normal range of motion. Neck supple. No tracheal deviation present.  Cardiovascular: Normal rate, regular rhythm, S1 normal, S2 normal, normal heart sounds, intact distal pulses and normal pulses.   Pulmonary/Chest: Effort normal and breath  sounds normal. No respiratory distress. She has no decreased breath sounds. She has no wheezes. She has no rhonchi. She has no rales.  Abdominal: Normal appearance and bowel sounds are normal. There is no tenderness.  Musculoskeletal: Normal range of motion.  Neurological: She is alert and oriented to person, place, and time. She has normal strength. No cranial nerve deficit or sensory deficit.  Cranial Nerves:  II: Pupils equal, round, reactive to light III,IV, VI: ptosis not present, extra-ocular motions intact bilaterally  V,VII: smile symmetric, facial light touch sensation equal VIII:  hearing grossly normal bilaterally  IX,X: midline uvula rise  XI: bilateral shoulder shrug equal and strong XII: midline tongue extension Finger to nose exam unremarkable. Neg pronator drift.  No motor/sensory deficits.   Skin: Skin is warm and dry.  Psychiatric: She has a normal mood and affect. Her speech is normal and behavior is normal. Thought content normal.     ED Treatments / Results  Labs (all labs ordered are listed, but only abnormal results are displayed) Labs Reviewed - No data to display  EKG  EKG Interpretation None       Radiology Ct Head Wo Contrast  Result Date: 09/24/2016 CLINICAL DATA:  Headache and dizziness for the past couple of months. Intermittent double vision. EXAM: CT HEAD WITHOUT CONTRAST TECHNIQUE: Contiguous axial images were obtained from the base of the skull through the vertex without intravenous contrast. COMPARISON:  None. FINDINGS: Brain: Ventricles are normal in size and configuration. All areas of the brain demonstrate normal gray-white matter attenuation. There is no mass, hemorrhage, edema or other evidence of acute parenchymal abnormality. No extra-axial hemorrhage. Vascular: No hyperdense vessel or unexpected calcification. Skull: Normal. Negative for fracture or focal lesion. Sinuses/Orbits: No acute finding. Other: None. IMPRESSION: Normal head CT. Electronically Signed   By: Franki Cabot M.D.   On: 09/24/2016 14:56    Procedures Procedures (including critical care time)  Medications Ordered in ED Medications - No data to display   Initial Impression / Assessment and Plan / ED Course  I have reviewed the triage vital signs and the nursing notes.  Pertinent labs & imaging results that were available during my care of the patient were reviewed by me and considered in my medical decision making (see chart for details).  Final Clinical Impressions(s) / ED Diagnoses   {I have reviewed and evaluated the relevant imaging studies.  {I  have reviewed the relevant previous healthcare records.  {I obtained HPI from historian.   ED Course:  Assessment: Patient is a 46 y.o. female that presents with headache x several months. Positional. Intermittent. Relief with Motrin. No N/V. No fevers. No numbness/tingling. Patient is without high-risk features of headache including: Sudden onset/thunderclap HA, No similar headache in past, Altered mental status, Accompanying seizure, Headache with exertion, Age > 50, History of immunocompromise, Neck or shoulder pain, Fever, Use of anticoagulation, Family history of spontaneous SAH, Concomitant drug use, Toxic exposure.  Patient has a normal complete neurological exam, normal vital signs, normal level of consciousness, no signs of meningismus, is well-appearing/non-toxic appearing, no signs of trauma. No papilledema, no pain over the temporal arteries. CT Head without contrast unremarkable. No dangerous or life-threatening conditions suspected or identified by history, physical exam, and by work-up. No indications for hospitalization identified. Likely tension headache vs migraine. Encouraged patient to take motrin PRN. Close follow up to PCP. Strict return precautions given. At time of discharge, Patient is in no acute distress. Vital Signs are stable. Patient  is able to ambulate. Patient able to tolerate PO.   Disposition/Plan: DC Home Additional Verbal discharge instructions given and discussed with patient.  Pt Instructed to f/u with PCP in the next week for evaluation and treatment of symptoms. Return precautions given Pt acknowledges and agrees with plan  Supervising Physician Leo Grosser, MD  Final diagnoses:  Nonintractable headache, unspecified chronicity pattern, unspecified headache type    New Prescriptions New Prescriptions   No medications on file     Shary Decamp, PA-C 09/24/16 [place], MD 09/25/16 [phone]

## 2016-09-24 NOTE — ED Triage Notes (Signed)
Pt complains of headache and dizziness for the past couple of months. Pt went to eye doctor and was found to have double vision intermittently. Pt was prescribed blood pressure medication by her PCP, which she no longer takes. Pt then went to [facility] urgent care a few days ago and was prescribed ibuprofen, which she states has helped but she does not want to keep taking medication.  Pt states the pain is worse when she bends over and is concentrated in her right forehead.

## 2016-09-24 NOTE — Discharge Instructions (Signed)
Please read and follow all provided instructions.  Your diagnoses today include:  1. Nonintractable headache, unspecified chronicity pattern, unspecified headache type     Tests performed today include: CT of your head which was normal and did not show any serious cause of your headache Vital signs. See below for your results today.   Medications:  Take any prescribed medications only as directed.  Additional information:  Follow any educational materials contained in this packet.  You are having a headache. No specific cause was found today for your headache. It may have been a migraine or other cause of headache. Stress, anxiety, fatigue, and depression are common triggers for headaches.   Your headache today does not appear to be life-threatening or require hospitalization, but often the exact cause of headaches is not determined in the emergency department. Therefore, follow-up with your doctor is very important to find out what may have caused your headache and whether or not you need any further diagnostic testing or treatment.   Sometimes headaches can appear benign (not harmful), but then more serious symptoms can develop which should prompt an immediate re-evaluation by your doctor or the emergency department.  BE VERY CAREFUL not to take multiple medicines containing Tylenol (also called acetaminophen). Doing so can lead to an overdose which can damage your liver and cause liver failure and possibly death.   Follow-up instructions: Please follow-up with your primary care provider in the next 3 days for further evaluation of your symptoms.   Return instructions:  Please return to the Emergency Department if you experience worsening symptoms. Return if the medications do not resolve your headache, if it recurs, or if you have multiple episodes of vomiting or cannot keep down fluids. Return if you have a change from the usual headache. RETURN IMMEDIATELY IF you: Develop a  sudden, severe headache Develop confusion or become poorly responsive or faint Develop a fever above 100.18F or problem breathing Have a change in speech, vision, swallowing, or understanding Develop new weakness, numbness, tingling, incoordination in your arms or legs Have a seizure Please return if you have any other emergent concerns.  Additional Information:  Your vital signs today were: BP (!) 135/91 (BP Location: Left Arm)    Pulse 85    Temp 98.2 F (36.8 C) (Oral)    Resp 18    Ht 5\' 7"  (1.702 m)    Wt 111.1 kg    SpO2 95%    BMI 38.37 kg/m  If your blood pressure (BP) was elevated above 135/85 this visit, please have this repeated by your doctor within one month. --------------

## 2016-10-04 ENCOUNTER — Telehealth: Payer: Self-pay | Admitting: Physician Assistant

## 2016-10-04 NOTE — Telephone Encounter (Signed)
PATIENT STATES SHE SAW MICHAEL CLARK 2 WEEKS AGO FOR HEADACHES. HER BLOOD PRESSURE WAS NORMAL IN THE OFFICE SO MICHAEL TOLD HER TO BUY A BP MONITOR TO USE AT HOME. SHE SAID SHE DOES NOT WANT TO HAVE A STROKE AND WONDERS IF HE SHOULD CALL HER IN SOME MEDICATION? HER READINGS HAVE BEEN: 09/22/16 - 152/98    09/23/16 - 120/80 am AND 148/83 pm    09/24/16 - 142/87   09/25/16 - 141/81    09/27/16 - 162/108    09/28/16 - 153/103   09/29/16 - 174/119    10/03/16 - 176/112    10/04/16 - 176/119. BEST PHONE [phone] (CELL) PHARMACY CHOICE IS [facility] ON [address] AND [address]. [place]

## 2016-10-05 ENCOUNTER — Telehealth: Payer: Self-pay | Admitting: Family Medicine

## 2016-10-05 NOTE — Telephone Encounter (Signed)
I want to see her back in clinic.  Advise that she bring her BP diary.  I need to talk to her about her labs as well. I'd like to see her next Monday afternoon. Philis Fendt, MS, PA-C 8:18 AM, 10/05/2016

## 2016-10-05 NOTE — Telephone Encounter (Signed)
LMOM FOR PT TO SET UP AN APPOINTMENT WITH MICHAEL ON 10-10-16 FOR A FOLLOW UP ON BP AND LABS

## 2016-10-24 ENCOUNTER — Ambulatory Visit (INDEPENDENT_AMBULATORY_CARE_PROVIDER_SITE_OTHER): Payer: BLUE CROSS/BLUE SHIELD | Admitting: Physician Assistant

## 2016-10-24 ENCOUNTER — Encounter: Payer: Self-pay | Admitting: Physician Assistant

## 2016-10-24 VITALS — BP 148/97 | HR 73 | Temp 98.2°F | Resp 18 | Ht 66.0 in | Wt 245.0 lb

## 2016-10-24 DIAGNOSIS — E119 Type 2 diabetes mellitus without complications: Secondary | ICD-10-CM | POA: Diagnosis not present

## 2016-10-24 DIAGNOSIS — F320 Major depressive disorder, single episode, mild: Secondary | ICD-10-CM | POA: Diagnosis not present

## 2016-10-24 DIAGNOSIS — I10 Essential (primary) hypertension: Secondary | ICD-10-CM

## 2016-10-24 MED ORDER — FLUOXETINE HCL 20 MG PO TABS: 20.0000 mg | ORAL_TABLET | Freq: Every day | ORAL | 3 refills | Status: DC

## 2016-10-24 MED ORDER — DULAGLUTIDE 0.75 MG/0.5ML ~~LOC~~ SOAJ: SUBCUTANEOUS | 1 refills | Status: DC

## 2016-10-24 MED ORDER — AMLODIPINE BESYLATE 10 MG PO TABS: ORAL_TABLET | ORAL | 3 refills | Status: DC

## 2016-10-24 NOTE — Progress Notes (Signed)
10/24/2016 2:59 PM   DOB: July 03, 1970 / MRN: [id number]  SUBJECTIVE:  Audrey Avery is a 46 y.o. female presenting for HTN and depression.    She tells me she gets up 3-4 times nightly. Goes to bed at 10 pm, falls asleep at about an hour.  She will always wake up at 3:15 and worry about her family and place in life. She often thinks that life is not worth living but would never do this. No HI either.  Does not think that anyone would miss her. Tells me that she does not have a history of depression but thinks she has probably been depressed for the last 4 years. Reports a worsening anhedonia over the last year.  She is also concerned about her weight and does report   Does not drink soda but does drink some carbonated drinks with possibly sugar.  Also eating cakes, candy cookies.  Is will to try and cut this back and would like to see diabetes nutrition.   Depression screen PHQ 2/9 10/24/2016  Decreased Interest 2  Down, Depressed, Hopeless 3  PHQ - 2 Score 5  Altered sleeping 3  Tired, decreased energy 2  Change in appetite 3  Feeling bad or failure about yourself  3  Trouble concentrating 1  Moving slowly or fidgety/restless 1  Suicidal thoughts 2  PHQ-9 Score 20  Difficult doing work/chores Extremely dIfficult      She is allergic to penicillins.   She  has a past medical history of Anemia and Hypertension.    She  reports that she has never smoked. She has never used smokeless tobacco. She reports that she does not drink alcohol or use drugs. She  has no sexual activity history on file. The patient  has a past surgical history that includes Cesarean section; Abdominal hysterectomy; Cholecystectomy; and Breast surgery.  Her family history includes Cancer in her father, mother, and sister; Diabetes in her father; Hypertension in her father, mother, and sister.  Review of Systems  Constitutional: Negative for chills, diaphoresis and fever.  Eyes: Negative.   Respiratory:  Negative for cough, hemoptysis, sputum production, shortness of breath and wheezing.   Cardiovascular: Negative for chest pain, orthopnea and leg swelling.  Gastrointestinal: Negative for nausea.  Skin: Negative for rash.  Neurological: Negative for dizziness, sensory change, speech change, focal weakness and headaches.    The problem list and medications were reviewed and updated by myself where necessary and exist elsewhere in the encounter.   OBJECTIVE:  BP (!) 148/97   Pulse 73   Temp 98.2 F (36.8 C) (Oral)   Resp 18   Ht 5\' 6"  (1.676 m)   Wt 245 lb (111.1 kg)   SpO2 94%   BMI 39.54 kg/m   Physical Exam  Constitutional: She is active.  Non-toxic appearance.  Cardiovascular: Normal rate, regular rhythm, S1 normal, S2 normal, normal heart sounds and intact distal pulses.  Exam reveals no gallop, no friction rub and no decreased pulses.   No murmur heard. Pulmonary/Chest: Effort normal. No stridor. No tachypnea. No respiratory distress. She has no wheezes. She has no rales.  Abdominal: She exhibits no distension.  Musculoskeletal: She exhibits no edema.  Neurological: She is alert.  Skin: Skin is warm and dry. She is not diaphoretic. No pallor.    Lab Results  Component Value Date   WBC 6.7 09/20/2016   HGB 9.6 (L) 02/14/2013   HCT 37.3 09/20/2016   MCV 77 (L) 09/20/2016  PLT 361 09/20/2016    Lab Results  Component Value Date   NA 140 09/20/2016   K 4.6 09/20/2016   CL 100 09/20/2016   CO2 26 09/20/2016    Lab Results  Component Value Date   CREATININE 0.59 09/20/2016    Lab Results  Component Value Date   ALT 7 01/16/2013   AST 13 01/16/2013   ALKPHOS 51 01/16/2013   BILITOT <0.20 01/16/2013    Lab Results  Component Value Date   TSH 1.590 09/20/2016    Lab Results  Component Value Date   HGBA1C 6.5 (H) 09/20/2016    BP Readings from Last 3 Encounters:  10/24/16 (!) 148/97  09/24/16 (!) 135/91  09/20/16 126/79   Wt Readings from Last  3 Encounters:  10/24/16 245 lb (111.1 kg)  09/24/16 245 lb (111.1 kg)  09/20/16 243 lb 12.8 oz (110.6 kg)     No results found for this or any previous visit (from the past 72 hour(s)).  No results found.  ASSESSMENT AND PLAN:  Audrey Avery was seen today for follow-up and depression.  Diagnoses and all orders for this visit:  Current mild episode of major depressive disorder without prior episode [facility]): Certainly dysthymic.  Will start therapy.  She works two jobs and I advised we hold on talk therapy for now given the last problem.  -     FLUoxetine (PROZAC) 20 MG tablet; Take 1 tablet (20 mg total) by mouth daily.  Essential hypertension -     amLODipine (NORVASC) 10 MG tablet; Take daily. Start with a half tab. If BP greater than 140/90 after one week then take the full tab.  Well controlled diabetes mellitus ([place]): Diabetes education and trulicity.  Advised that if she eats too much she will get nauseas.  She will also try to eat less sweets.  RTC in 3 months and that appointment was made today.  -     Dulaglutide (TRULICITY) 0.71 QR/9.7JO SOPN; Inject 0.75 mg weekly. -     Ambulatory referral to diabetic education    The patient is advised to call or return to clinic if she does not see an improvement in symptoms, or to seek the care of the closest emergency department if she worsens with the above plan.   Philis Fendt, MHS, PA-C [facility] 10/24/2016 2:59 PM

## 2017-01-30 ENCOUNTER — Ambulatory Visit (INDEPENDENT_AMBULATORY_CARE_PROVIDER_SITE_OTHER): Payer: BLUE CROSS/BLUE SHIELD | Admitting: Physician Assistant

## 2017-01-30 ENCOUNTER — Encounter: Payer: Self-pay | Admitting: Physician Assistant

## 2017-01-30 VITALS — BP 128/78 | HR 70 | Temp 97.8°F | Resp 16 | Ht 66.0 in | Wt 223.0 lb

## 2017-01-30 DIAGNOSIS — G44209 Tension-type headache, unspecified, not intractable: Secondary | ICD-10-CM | POA: Diagnosis not present

## 2017-01-30 NOTE — Progress Notes (Signed)
    01/30/2017 12:16 PM   DOB: 12-15-1970 / MRN: [id number]  SUBJECTIVE:  Audrey Avery is a 46 y.o. female presenting for   She is allergic to penicillins.   She  has a past medical history of Anemia and Hypertension.    She  reports that she has never smoked. She has never used smokeless tobacco. She reports that she does not drink alcohol or use drugs. She  has no sexual activity history on file. The patient  has a past surgical history that includes Cesarean section; Abdominal hysterectomy; Cholecystectomy; and Breast surgery.  Her family history includes Cancer in her father, mother, and sister; Diabetes in her father; Hypertension in her father, mother, and sister.  ROS  The problem list and medications were reviewed and updated by myself where necessary and exist elsewhere in the encounter.   OBJECTIVE:  BP (!) 145/92 (BP Location: Right Arm, Patient Position: Sitting, Cuff Size: Large)   Pulse 70   Temp 97.8 F (36.6 C) (Oral)   Resp 16   Ht 5\' 6"  (1.676 m)   Wt 223 lb (101.2 kg)   SpO2 97%   BMI 35.99 kg/m   Physical Exam  No results found for this or any previous visit (from the past 72 hour(s)).  No results found.  ASSESSMENT AND PLAN:  There are no diagnoses linked to this encounter.  The patient is advised to call or return to clinic if she does not see an improvement in symptoms, or to seek the care of the closest emergency department if she worsens with the above plan.   Philis Fendt, MHS, PA-C [facility] 01/30/2017 12:16 PM

## 2017-01-30 NOTE — Patient Instructions (Addendum)
Call in 4 weeks to request a refill of trulicity.  I want to see you back in about 6 months for BP recheck and weight recheck.   Take 600 mg  Ibuprofen plus 1000 mg tylenol every eight hours as needed for headache.     IF you received an x-ray today, you will receive an invoice from [facility]. Please contact [facility] at [phone] with questions or concerns regarding your invoice.   IF you received labwork today, you will receive an invoice from [facility]. Please contact [facility] at [phone] with questions or concerns regarding your invoice.   Our billing staff will not be able to assist you with questions regarding bills from these companies.  You will be contacted with the lab results as soon as they are available. The fastest way to get your results is to activate your My Chart account. Instructions are located on the last page of this paperwork. If you have not heard from [place] regarding the results in 2 weeks, please contact this office.

## 2017-01-30 NOTE — Progress Notes (Signed)
    01/30/2017 3:12 PM   DOB: 1970/11/27 / MRN: [id number]  SUBJECTIVE:  Audrey Avery is a 46 y.o. female presenting for   She is allergic to penicillins.   She  has a past medical history of Anemia and Hypertension.    She  reports that she has never smoked. She has never used smokeless tobacco. She reports that she does not drink alcohol or use drugs. She  has no sexual activity history on file. The patient  has a past surgical history that includes Cesarean section; Abdominal hysterectomy; Cholecystectomy; and Breast surgery.  Her family history includes Cancer in her father, mother, and sister; Diabetes in her father; Hypertension in her father, mother, and sister.  Review of Systems  Eyes: Negative.   Respiratory: Negative for cough and shortness of breath.   Cardiovascular: Negative for chest pain and leg swelling.  Neurological: Positive for headaches. Negative for dizziness, tingling, tremors, sensory change, speech change, focal weakness, seizures and loss of consciousness.    The problem list and medications were reviewed and updated by myself where necessary and exist elsewhere in the encounter.   OBJECTIVE:  BP (!) 145/92 (BP Location: Right Arm, Patient Position: Sitting, Cuff Size: Large)   Pulse 70   Temp 97.8 F (36.6 C) (Oral)   Resp 16   Ht 5\' 6"  (1.676 m)   Wt 223 lb (101.2 kg)   SpO2 97%   BMI 35.99 kg/m   Physical Exam  Constitutional: She is oriented to person, place, and time. She is active.  Non-toxic appearance.  Eyes: Pupils are equal, round, and reactive to light. EOM are normal.  Cardiovascular: Normal rate, regular rhythm, S1 normal, S2 normal, normal heart sounds and intact distal pulses.  Exam reveals no gallop, no friction rub and no decreased pulses.   No murmur heard. Pulmonary/Chest: Effort normal. No stridor. No tachypnea. No respiratory distress. She has no wheezes. She has no rales.  Abdominal: She exhibits no distension.    Musculoskeletal: She exhibits no edema.  Neurological: She is alert and oriented to person, place, and time. She has normal strength and normal reflexes. She is not disoriented. She displays no atrophy. No cranial nerve deficit or sensory deficit. She exhibits normal muscle tone. Coordination and gait normal.  Skin: Skin is warm and dry. She is not diaphoretic. No pallor.  Psychiatric: Her behavior is normal.    Wt Readings from Last 3 Encounters:  01/30/17 223 lb (101.2 kg)  10/24/16 245 lb (111.1 kg)  09/24/16 245 lb (111.1 kg)     No results found for this or any previous visit (from the past 72 hour(s)).  No results found.  ASSESSMENT AND PLAN:  [name] was seen today for follow-up.  Diagnoses and all orders for this visit:  Acute non intractable tension-type headache: No red flags, no migrainous features. She did just braid her hair very tightly.  Advised OTC abortive measures PER avs.     The patient is advised to call or return to clinic if she does not see an improvement in symptoms, or to seek the care of the closest emergency department if she worsens with the above plan.   Philis Fendt, MHS, PA-C [facility] 01/30/2017 3:12 PM

## 2017-02-15 ENCOUNTER — Ambulatory Visit (INDEPENDENT_AMBULATORY_CARE_PROVIDER_SITE_OTHER): Payer: BLUE CROSS/BLUE SHIELD | Admitting: Physician Assistant

## 2017-02-15 ENCOUNTER — Ambulatory Visit (INDEPENDENT_AMBULATORY_CARE_PROVIDER_SITE_OTHER): Payer: BLUE CROSS/BLUE SHIELD

## 2017-02-15 ENCOUNTER — Encounter: Payer: Self-pay | Admitting: Physician Assistant

## 2017-02-15 VITALS — BP 132/94 | HR 74 | Resp 16 | Ht 66.0 in | Wt 223.4 lb

## 2017-02-15 DIAGNOSIS — Z889 Allergy status to unspecified drugs, medicaments and biological substances status: Secondary | ICD-10-CM

## 2017-02-15 DIAGNOSIS — R05 Cough: Secondary | ICD-10-CM

## 2017-02-15 DIAGNOSIS — R059 Cough, unspecified: Secondary | ICD-10-CM

## 2017-02-15 DIAGNOSIS — R21 Rash and other nonspecific skin eruption: Secondary | ICD-10-CM

## 2017-02-15 LAB — POCT SKIN KOH: SKIN KOH, POC: NEGATIVE

## 2017-02-15 MED ORDER — CLOTRIMAZOLE-BETAMETHASONE 1-0.05 % EX CREA: 1.0000 "application " | TOPICAL_CREAM | Freq: Two times a day (BID) | CUTANEOUS | 0 refills | Status: DC

## 2017-02-15 NOTE — Patient Instructions (Addendum)
Your chest xray is normal! I will be in contact regarding the remainder of you labs.  I truly think you are healthy and will be just fine despite this exposure.    IF you received an x-ray today, you will receive an invoice from [facility]. Please contact [facility] at [phone] with questions or concerns regarding your invoice.   IF you received labwork today, you will receive an invoice from [facility]. Please contact [facility] at [phone] with questions or concerns regarding your invoice.   Our billing staff will not be able to assist you with questions regarding bills from these companies.  You will be contacted with the lab results as soon as they are available. The fastest way to get your results is to activate your My Chart account. Instructions are located on the last page of this paperwork. If you have not heard from [place] regarding the results in 2 weeks, please contact this office.

## 2017-02-15 NOTE — Progress Notes (Signed)
02/15/2017 3:33 PM   DOB: 1970/06/22 / MRN: [id number]  SUBJECTIVE:  Audrey Avery is a 46 y.o. female presenting for exposure to mold. Tells me that her apartment has been contaminated with moisture and subsequent mold and she has been moved to another unit.  She is worried about her health and wants to be "checked out" to make sure that she is no harboring any mold.  Does complain of a mildly pruritic rash between and under the bilateral breast. Has tried cortisone with some relief.   She is allergic to penicillins.   She  has a past medical history of Anemia and Hypertension.    She  reports that she has never smoked. She has never used smokeless tobacco. She reports that she does not drink alcohol or use drugs. She  has no sexual activity history on file. The patient  has a past surgical history that includes Cesarean section; Abdominal hysterectomy; Cholecystectomy; and Breast surgery.  Her family history includes Cancer in her father, mother, and sister; Diabetes in her father; Hypertension in her father, mother, and sister.  Review of Systems  Constitutional: Negative for chills, diaphoresis and fever.  Respiratory: Positive for cough. Negative for hemoptysis, sputum production, shortness of breath and wheezing.   Cardiovascular: Negative for chest pain, orthopnea and leg swelling.  Gastrointestinal: Negative for nausea.  Skin: Positive for itching and rash.  Neurological: Negative for dizziness.    The problem list and medications were reviewed and updated by myself where necessary and exist elsewhere in the encounter.   OBJECTIVE:  BP (!) 132/94 (BP Location: Left Arm, Patient Position: Sitting, Cuff Size: Large)   Pulse 74   Resp 16   Ht 5\' 6"  (1.676 m)   Wt 223 lb 6.4 oz (101.3 kg)   SpO2 98%   BMI 36.06 kg/m   Physical Exam  Constitutional: She is active.  Non-toxic appearance.  Cardiovascular: Normal rate, regular rhythm, S1 normal, S2 normal, normal heart  sounds and intact distal pulses.  Exam reveals no gallop, no friction rub and no decreased pulses.   No murmur heard. Pulmonary/Chest: Effort normal. No stridor. No tachypnea. No respiratory distress. She has no wheezes. She has no rales.  Abdominal: She exhibits no distension.  Musculoskeletal: She exhibits no edema.  Neurological: She is alert.  Skin: Skin is warm and dry. Rash (macular contiguos hyperpicmentd intertirginous rash between and under the bilateral breast) noted. She is not diaphoretic. No pallor.    Results for orders placed or performed in visit on 02/15/17 (from the past 72 hour(s))  POCT Skin KOH     Status: None   Collection Time: 02/15/17  3:06 PM  Result Value Ref Range   Skin KOH, POC Negative Negative    Dg Chest 2 View  Result Date: 02/15/2017 CLINICAL DATA:  Cough, mold exposure EXAM: CHEST  2 VIEW COMPARISON:  03/29/2005 FINDINGS: Normal heart size, mediastinal contours, and pulmonary vascularity. Quantum mottling artifacts on PA view. Lungs grossly clear. No definite infiltrate, pleural effusion or pneumothorax. Osseous structures unremarkable. IMPRESSION: No acute abnormalities. Electronically Signed   By: Lavonia Dana M.D.   On: 02/15/2017 15:24    ASSESSMENT AND PLAN:  Tiffanie was seen today for mold exposure.  Diagnoses and all orders for this visit:  Multiple allergies:  -     Allergen Profile, Mold -     Eosinophil count -     POCT Skin KOH  Cough -  DG Chest 2 View; Future  Rash and nonspecific skin eruption -     clotrimazole-betamethasone (LOTRISONE) cream; Apply 1 application topically 2 (two) times daily.    The patient is advised to call or return to clinic if she does not see an improvement in symptoms, or to seek the care of the closest emergency department if she worsens with the above plan.   Philis Fendt, MHS, PA-C [facility] 02/15/2017 3:33 PM

## 2017-02-18 LAB — ALLERGEN PROFILE, MOLD
Alternaria Alternata IgE: <0.1 kU/L
Aureobasidi Pullulans IgE: <0.1 kU/L
Candida Albicans IgE: <0.1 kU/L
Cladosporium Herbarum IgE: <0.1 kU/L
M009-IgE Fusarium proliferatum: <0.1 kU/L
Mucor Racemosus IgE: <0.1 kU/L
Phoma Betae IgE: <0.1 kU/L

## 2017-02-18 LAB — EOSINOPHIL COUNT: EOS (ABSOLUTE): 0.1 10*3/uL (ref 0.0–0.4)

## 2017-02-18 NOTE — Progress Notes (Signed)
This labs show no reactivity to mold.  This is very reassuring. Please make patient aware of results via letter. In the context of her overall presentation any abnormal values are of no clinical significance.  Philis Fendt PA-C, 02/18/2017 8:38 AM

## 2017-02-22 ENCOUNTER — Encounter: Payer: Self-pay | Admitting: Radiology

## 2017-02-27 ENCOUNTER — Telehealth: Payer: Self-pay | Admitting: Family Medicine

## 2017-02-27 DIAGNOSIS — E119 Type 2 diabetes mellitus without complications: Secondary | ICD-10-CM

## 2017-03-07 NOTE — Telephone Encounter (Signed)
Audrey Avery, ok to fill Trulicity? Patient seen within 1 month, please advise.

## 2017-03-07 NOTE — Telephone Encounter (Signed)
Yes. Thank you Sherese.

## 2017-03-09 MED ORDER — DULAGLUTIDE 0.75 MG/0.5ML ~~LOC~~ SOAJ: SUBCUTANEOUS | 2 refills | Status: DC

## 2017-03-27 ENCOUNTER — Other Ambulatory Visit: Payer: Self-pay | Admitting: Physician Assistant

## 2017-03-27 DIAGNOSIS — E119 Type 2 diabetes mellitus without complications: Secondary | ICD-10-CM

## 2017-04-21 ENCOUNTER — Other Ambulatory Visit: Payer: Self-pay | Admitting: Physician Assistant

## 2017-07-04 ENCOUNTER — Encounter: Payer: Self-pay | Admitting: Physician Assistant

## 2017-07-13 ENCOUNTER — Encounter (HOSPITAL_COMMUNITY): Payer: Self-pay

## 2017-07-13 ENCOUNTER — Emergency Department (HOSPITAL_COMMUNITY): Payer: BLUE CROSS/BLUE SHIELD

## 2017-07-13 ENCOUNTER — Emergency Department (HOSPITAL_COMMUNITY)
Admission: EM | Admit: 2017-07-13 | Discharge: 2017-07-14 | Disposition: A | Discharge status: Home / Self Care | Payer: BLUE CROSS/BLUE SHIELD | Attending: Emergency Medicine | Admitting: Emergency Medicine

## 2017-07-13 ENCOUNTER — Other Ambulatory Visit: Payer: Self-pay

## 2017-07-13 DIAGNOSIS — R0789 Other chest pain: Secondary | ICD-10-CM | POA: Insufficient documentation

## 2017-07-13 DIAGNOSIS — I10 Essential (primary) hypertension: Secondary | ICD-10-CM | POA: Insufficient documentation

## 2017-07-13 DIAGNOSIS — R0602 Shortness of breath: Secondary | ICD-10-CM | POA: Diagnosis not present

## 2017-07-13 DIAGNOSIS — R079 Chest pain, unspecified: Secondary | ICD-10-CM | POA: Diagnosis not present

## 2017-07-13 LAB — BASIC METABOLIC PANEL
Anion gap: 7 (ref 5–15)
BUN: 9 mg/dL (ref 6–20)
CO2: 26 mmol/L (ref 22–32)
Calcium: 9 mg/dL (ref 8.9–10.3)
Chloride: 102 mmol/L (ref 101–111)
Creatinine, Ser: 0.57 mg/dL (ref 0.44–1.00)
GFR calc Af Amer: >60 mL/min (ref >60)
GFR calc non Af Amer: >60 mL/min (ref >60)
Glucose, Bld: 99 mg/dL (ref 65–99)
Potassium: 3.5 mmol/L (ref 3.5–5.1)
Sodium: 135 mmol/L (ref 135–145)

## 2017-07-13 LAB — CBC
HCT: 35.5 % — ABNORMAL LOW (ref 36.0–46.0)
Hemoglobin: 11.3 g/dL — ABNORMAL LOW (ref 12.0–15.0)
MCH: 24.8 pg — ABNORMAL LOW (ref 26.0–34.0)
MCHC: 31.8 g/dL (ref 30.0–36.0)
MCV: 77.9 fL — ABNORMAL LOW (ref 78.0–100.0)
Platelets: 286 10*3/uL (ref 150–400)
RBC: 4.56 MIL/uL (ref 3.87–5.11)
RDW: 15.2 % (ref 11.5–15.5)
WBC: 7.2 10*3/uL (ref 4.0–10.5)

## 2017-07-13 LAB — I-STAT TROPONIN, ED: Troponin i, poc: 0 ng/mL (ref 0.00–0.08)

## 2017-07-13 MED ORDER — IOPAMIDOL (ISOVUE-370) INJECTION 76%
INTRAVENOUS | Status: AC
  Administered 2017-07-13: 100 mL
  Filled 2017-07-13: qty 100

## 2017-07-13 NOTE — ED Triage Notes (Signed)
Patient c/o right chest pain and  SOB since 1100 yesterday. Patient states the chest pain is worse with a deep breath, movement, and walking. Patient denies any N/V or diaphoresis.

## 2017-07-13 NOTE — ED Notes (Signed)
Patient transported to CT 

## 2017-07-13 NOTE — ED Provider Notes (Signed)
[facility] DEPT Provider Note   CSN: [id number] Arrival date & time: 07/13/17  1900     History   Chief Complaint Chief Complaint  Patient presents with  . Chest Pain    HPI Audrey Avery is a 47 y.o. female.  Patient presents to the ED with a chief complaint of chest pain and SOB.  She states that the symptoms started yesterday morning.  She reports that she is now developing a slight non-productive cough.  She denies any fevers or chills.  She has not taken any medication for her symptoms.  She states that the pain is worsened with breathing and reports that it is the pain that causes her to feel short of breath.  The pain radiates around her right ribs.  She states that she did have a mechanical fall a little over a week ago and fell down a flight of stairs.     The history is provided by the patient. No language interpreter was used.    Past Medical History:  Diagnosis Date  . Anemia   . Hypertension     Patient Active Problem List   Diagnosis Date Noted  . Anemia, unspecified 02/24/2013    Past Surgical History:  Procedure Laterality Date  . ABDOMINAL HYSTERECTOMY    . BREAST SURGERY    . CESAREAN SECTION    . CHOLECYSTECTOMY      OB History    No data available       Home Medications    Prior to Admission medications   Medication Sig Start Date End Date Taking? Authorizing Provider  amLODipine (NORVASC) 10 MG tablet Take daily. Start with a half tab. If BP greater than 140/90 after one week then take the full tab. 10/24/16  Yes Tereasa Coop, PA-C  FLUoxetine (PROZAC) 20 MG capsule TAKE ONE CAPSULE BY MOUTH EVERY DAY 04/21/17  Yes Tereasa Coop, PA-C  clotrimazole-betamethasone (LOTRISONE) cream Apply 1 application topically 2 (two) times daily. Patient not taking: Reported on 07/13/2017 02/15/17   Tereasa Coop, PA-C  Dulaglutide (TRULICITY) 3.87 FI/4.3PI SOPN Inject 0.75 mg weekly. 03/09/17   Tereasa Coop,  PA-C  FLUoxetine (PROZAC) 20 MG tablet Take 1 tablet (20 mg total) by mouth daily. Patient not taking: Reported on 07/13/2017 10/24/16   Tereasa Coop, PA-C  ibuprofen (ADVIL,MOTRIN) 600 MG tablet Take 1 tablet (600 mg total) by mouth every 8 (eight) hours as needed for headache. Patient not taking: Reported on 07/13/2017 09/20/16   Tereasa Coop, PA-C    Family History Family History  Problem Relation Age of Onset  . Cancer Mother   . Hypertension Mother   . Diabetes Father   . Cancer Father   . Hypertension Father   . Cancer Sister   . Hypertension Sister     Social History Social History   Tobacco Use  . Smoking status: Never Smoker  . Smokeless tobacco: Never Used  Substance Use Topics  . Alcohol use: No  . Drug use: No     Allergies   Penicillins   Review of Systems Review of Systems  All other systems reviewed and are negative.    Physical Exam Updated Vital Signs BP (!) 171/98 (BP Location: Right Arm)   Pulse 89   Temp 98.5 F (36.9 C) (Oral)   Resp (!) 24   Ht 5\' 6"  (1.676 m)   Wt 99.8 kg (220 lb)   SpO2 96%   BMI 35.51  kg/m   Physical Exam  Constitutional: She is oriented to person, place, and time. She appears well-developed and well-nourished.  HENT:  Head: Normocephalic and atraumatic.  Eyes: Conjunctivae and EOM are normal. Pupils are equal, round, and reactive to light.  Neck: Normal range of motion. Neck supple.  Cardiovascular: Normal rate and regular rhythm. Exam reveals no gallop and no friction rub.  No murmur heard. Pulmonary/Chest: Effort normal and breath sounds normal. No respiratory distress. She has no wheezes. She has no rales. She exhibits no tenderness.  Abdominal: Soft. Bowel sounds are normal. She exhibits no distension and no mass. There is no tenderness. There is no rebound and no guarding.  Musculoskeletal: Normal range of motion. She exhibits no edema or tenderness.  Neurological: She is alert and oriented to person,  place, and time.  Skin: Skin is warm and dry.  Psychiatric: She has a normal mood and affect. Her behavior is normal. Judgment and thought content normal.  Nursing note and vitals reviewed.    ED Treatments / Results  Labs (all labs ordered are listed, but only abnormal results are displayed) Labs Reviewed  CBC - Abnormal; Notable for the following components:      Result Value   Hemoglobin 11.3 (*)    HCT 35.5 (*)    MCV 77.9 (*)    MCH 24.8 (*)    All other components within normal limits  BASIC METABOLIC PANEL  I-STAT TROPONIN, ED    EKG  EKG Interpretation  Date/Time:  Thursday July 13 2017 19:13:52 EST Ventricular Rate:  89 PR Interval:    QRS Duration: 88 QT Interval:  362 QTC Calculation: 441 R Axis:   82 Text Interpretation:  Sinus rhythm Borderline T abnormalities, anterior leads No old tracing to compare Confirmed by Orlie Dakin [phone]) on 07/13/2017 9:47:16 PM       Radiology Dg Chest 2 View  Result Date: 07/13/2017 CLINICAL DATA:  47 year old female with right side chest pain and shortness of breath since yesterday. Pleuritic pain. EXAM: CHEST  2 VIEW COMPARISON:  02/15/2017 and earlier. FINDINGS: Lower lung volumes on both views. Mediastinal contours remain normal. Visualized tracheal air column is within normal limits. Mild streaky bibasilar opacity. No pneumothorax, pulmonary edema, consolidation or pleural effusion. Negative visible bowel gas pattern. No osseous abnormality identified. Stable cholecystectomy clips. IMPRESSION: Lower lung volumes with bibasilar opacity most resembling atelectasis. Electronically Signed   By: Genevie Ann M.D.   On: 07/13/2017 19:33    Procedures Procedures (including critical care time)  Medications Ordered in ED Medications - No data to display   Initial Impression / Assessment and Plan / ED Course  I have reviewed the triage vital signs and the nursing notes.  Pertinent labs & imaging results that were available  during my care of the patient were reviewed by me and considered in my medical decision making (see chart for details).     Patient with chest pain and shortness of breath.  She reports that the pain on the right side of her chest takes her breath away.  She denies any history of ACS or PE or DVT.  Laboratory studies ordered in triage are unremarkable.  Troponin is 0.00.  There are no ischemic changes on her EKG.  Doubt ACS.  Electrolytes are without abnormality.  She has no leukocytosis.  Her chest x-ray shows possible bibasilar atelectasis.  Patient states that she has developed a dry cough.  She denies fevers or chills.  Question whether the  cough is developing pneumonia.  Also on exam, patient has shallow breathing.  She states this is because of the pain.  She also states that she sustained a mechanical fall a little over a week ago and fell down about 16 steps.  With the recent trauma and her shallow breathing will check CT of chest.  CT of chest is negative for PE.  There is no evidence of rib fracture, pneumothorax, or other acute process.  Treat with NSAIDs and muscle relaxer.  Recommend primary care follow-up in 2-3 days.  Patient understands and agrees the plan.  She is stable and ready for discharge.  Final Clinical Impressions(s) / ED Diagnoses   Final diagnoses:  Chest wall pain    ED Discharge Orders        Ordered    ibuprofen (ADVIL,MOTRIN) 800 MG tablet  3 times daily     07/14/17 0033    cyclobenzaprine (FLEXERIL) 10 MG tablet  2 times daily PRN     07/14/17 0033       Montine Circle, PA-C 07/14/17 [id number]    Virgel Manifold, MD 07/14/17 2349

## 2017-07-14 MED ORDER — CYCLOBENZAPRINE HCL 10 MG PO TABS: 10.0000 mg | ORAL_TABLET | Freq: Two times a day (BID) | ORAL | 0 refills | Status: DC | PRN

## 2017-07-14 MED ORDER — IBUPROFEN 800 MG PO TABS: 800.0000 mg | ORAL_TABLET | Freq: Three times a day (TID) | ORAL | 0 refills | Status: DC

## 2017-07-28 ENCOUNTER — Other Ambulatory Visit: Payer: Self-pay | Admitting: Physician Assistant

## 2017-09-06 ENCOUNTER — Ambulatory Visit (INDEPENDENT_AMBULATORY_CARE_PROVIDER_SITE_OTHER): Payer: BLUE CROSS/BLUE SHIELD | Admitting: Physician Assistant

## 2017-09-06 ENCOUNTER — Other Ambulatory Visit: Payer: Self-pay

## 2017-09-06 ENCOUNTER — Encounter: Payer: Self-pay | Admitting: Physician Assistant

## 2017-09-06 VITALS — BP 136/90 | HR 82 | Temp 99.1°F | Resp 20 | Ht 66.0 in | Wt 277.2 lb

## 2017-09-06 DIAGNOSIS — R05 Cough: Secondary | ICD-10-CM

## 2017-09-06 DIAGNOSIS — R059 Cough, unspecified: Secondary | ICD-10-CM

## 2017-09-06 MED ORDER — CETIRIZINE-PSEUDOEPHEDRINE ER 5-120 MG PO TB12: 1.0000 | ORAL_TABLET | Freq: Two times a day (BID) | ORAL | 0 refills | Status: AC

## 2017-09-06 MED ORDER — CLOTRIMAZOLE-BETAMETHASONE 1-0.05 % EX CREA: 1.0000 "application " | TOPICAL_CREAM | Freq: Two times a day (BID) | CUTANEOUS | 0 refills | Status: DC

## 2017-09-06 MED ORDER — IBUPROFEN 800 MG PO TABS
800.0000 mg | ORAL_TABLET | Freq: Three times a day (TID) | ORAL | 0 refills | Status: DC
Start: 2017-09-06 — End: 2017-09-06

## 2017-09-06 MED ORDER — HYDROCODONE-HOMATROPINE 5-1.5 MG/5ML PO SYRP: 2.5000 mL | ORAL_SOLUTION | Freq: Every evening | ORAL | 0 refills | Status: DC | PRN

## 2017-09-06 MED ORDER — BENZONATATE 200 MG PO CAPS: 200.0000 mg | ORAL_CAPSULE | Freq: Two times a day (BID) | ORAL | 0 refills | Status: DC | PRN

## 2017-09-06 MED ORDER — CYCLOBENZAPRINE HCL 10 MG PO TABS: 10.0000 mg | ORAL_TABLET | Freq: Two times a day (BID) | ORAL | 0 refills | Status: DC | PRN

## 2017-09-06 NOTE — Progress Notes (Signed)
09/06/2017 3:17 PM   DOB: 1970-06-30 / MRN: [id number]  SUBJECTIVE:  Audrey Avery is a 47 y.o. female presenting for cough, nasal congestion.  Symptoms have been present for about 2 days now and are worsening.  Which eventually to get better.  She denies fever, chills, nausea, anuresis, chest pain, shortness of breath, DOE, orthopnea.  She is allergic to penicillins.   She  has a past medical history of Anemia and Hypertension.    She  reports that  has never smoked. she has never used smokeless tobacco. She reports that she does not drink alcohol or use drugs. She  has no sexual activity history on file. The patient  has a past surgical history that includes Cesarean section; Abdominal hysterectomy; Cholecystectomy; and Breast surgery.  Her family history includes Cancer in her father, mother, and sister; Diabetes in her father; Hypertension in her father, mother, and sister.  ROS per HPI  The problem list and medications were reviewed and updated by myself where necessary and exist elsewhere in the encounter.   OBJECTIVE:  BP 136/90 (BP Location: Right Arm, Patient Position: Sitting, Cuff Size: Large)   Pulse 82   Temp 99.1 F (37.3 C) (Oral)   Resp 20   Ht 5\' 6"  (1.676 m)   Wt 277 lb 3.2 oz (125.7 kg)   SpO2 99%   BMI 44.74 kg/m    Physical Exam  Constitutional: She is active.  Non-toxic appearance.  HENT:  Right Ear: Hearing, tympanic membrane, external ear and ear canal normal.  Left Ear: Hearing, tympanic membrane, external ear and ear canal normal.  Nose: Nose normal. Right sinus exhibits no maxillary sinus tenderness and no frontal sinus tenderness. Left sinus exhibits no maxillary sinus tenderness and no frontal sinus tenderness.  Mouth/Throat: Uvula is midline, oropharynx is clear and moist and mucous membranes are normal. Mucous membranes are not dry. No oropharyngeal exudate, posterior oropharyngeal edema or tonsillar abscesses.  Cardiovascular: Normal rate,  regular rhythm, S1 normal, S2 normal, normal heart sounds and intact distal pulses. Exam reveals no gallop, no friction rub and no decreased pulses.  No murmur heard. Pulmonary/Chest: Effort normal. No stridor. No tachypnea. No respiratory distress. She has no wheezes. She has no rales.  Abdominal: She exhibits no distension.  Musculoskeletal: She exhibits no edema.  Lymphadenopathy:       Head (right side): No submandibular and no tonsillar adenopathy present.       Head (left side): No submandibular and no tonsillar adenopathy present.    She has no cervical adenopathy.  Neurological: She is alert.  Skin: Skin is warm and dry. She is not diaphoretic. No pallor.    No results found for this or any previous visit (from the past 72 hour(s)).  No results found.  ASSESSMENT AND PLAN:  Audrey Avery was seen today for cough.  Diagnoses and all orders for this visit:  Cough -     benzonatate (TESSALON) 200 MG capsule; Take 1 capsule (200 mg total) by mouth 2 (two) times daily as needed for cough. -     HYDROcodone-homatropine (HYCODAN) 5-1.5 MG/5ML syrup; Take 2.5-5 mLs by mouth at bedtime as needed. -     cetirizine-pseudoephedrine (ZYRTEC-D) 5-120 MG tablet; Take 1 tablet by mouth 2 (two) times daily for 7 days.  Other orders -     Discontinue: clotrimazole-betamethasone (LOTRISONE) cream; Apply 1 application topically 2 (two) times daily. -     Discontinue: cyclobenzaprine (FLEXERIL) 10 MG tablet; Take 1  tablet (10 mg total) by mouth 2 (two) times daily as needed for muscle spasms. -     Discontinue: ibuprofen (ADVIL,MOTRIN) 800 MG tablet; Take 1 tablet (800 mg total) by mouth 3 (three) times daily.    The patient is advised to call or return to clinic if she does not see an improvement in symptoms, or to seek the care of the closest emergency department if she worsens with the above plan.   Audrey Avery, MHS, PA-C [facility] 09/06/2017 3:17 PM

## 2017-09-06 NOTE — Patient Instructions (Addendum)
  Start taking benzoil peroxide 10% daily face wash (clean and clear).    IF you received an x-ray today, you will receive an invoice from [facility]. Please contact [facility] at [phone] with questions or concerns regarding your invoice.   IF you received labwork today, you will receive an invoice from [facility]. Please contact [facility] at [phone] with questions or concerns regarding your invoice.   Our billing staff will not be able to assist you with questions regarding bills from these companies.  You will be contacted with the lab results as soon as they are available. The fastest way to get your results is to activate your My Chart account. Instructions are located on the last page of this paperwork. If you have not heard from [place] regarding the results in 2 weeks, please contact this office.

## 2017-09-18 ENCOUNTER — Other Ambulatory Visit: Payer: Self-pay | Admitting: Physician Assistant

## 2017-09-18 DIAGNOSIS — I10 Essential (primary) hypertension: Secondary | ICD-10-CM

## 2017-09-26 ENCOUNTER — Encounter: Payer: Self-pay | Admitting: Physician Assistant

## 2017-10-11 ENCOUNTER — Other Ambulatory Visit: Payer: Self-pay | Admitting: Physician Assistant

## 2017-10-11 DIAGNOSIS — I10 Essential (primary) hypertension: Secondary | ICD-10-CM

## 2017-10-11 NOTE — Telephone Encounter (Signed)
Incoming fax received from [facility] on [address]. Fax is requesting 90 day supply of amlodipine 10mg . Patient last seen for hypertension on 10/24/2016, medication last refilled on 09/18/2017 for 30 days. Patient will need office visit for further refills.   Phone call to patient. Advised she will need an office visit for further refills of amlodipine, she verbalizes understanding. Patient transferred up to front desk to schedule.

## 2017-10-16 ENCOUNTER — Ambulatory Visit: Payer: BLUE CROSS/BLUE SHIELD | Admitting: Physician Assistant

## 2017-10-27 ENCOUNTER — Other Ambulatory Visit: Payer: Self-pay | Admitting: Physician Assistant

## 2017-10-27 NOTE — Telephone Encounter (Signed)
Patient is requesting a refill of the following medications: Requested Prescriptions   Pending Prescriptions Disp Refills  . FLUoxetine (PROZAC) 20 MG capsule [Pharmacy Med Name: FLUOXETINE HCL 20 MG CAPSULE] 90 capsule 0    Sig: TAKE ONE CAPSULE BY MOUTH EVERY DAY    Date of patient request: 10/27/2017 Last office visit: 09/06/2017 Date of last refill: 07/31/2017 - 20mg  daily Last refill amount: 90 Follow up time period per chart: not listed

## 2017-10-30 ENCOUNTER — Other Ambulatory Visit: Payer: Self-pay | Admitting: Physician Assistant

## 2017-10-30 DIAGNOSIS — E119 Type 2 diabetes mellitus without complications: Secondary | ICD-10-CM

## 2017-10-31 NOTE — Telephone Encounter (Signed)
Trulicity 3.21 YY/4.8 ml pen  LOV 09/20/16 with Rhetta Mura   Had appt 10/16/17 that was cancelled  [facility] [id number], [place] - [address].

## 2017-12-28 ENCOUNTER — Other Ambulatory Visit: Payer: Self-pay

## 2017-12-28 ENCOUNTER — Ambulatory Visit (INDEPENDENT_AMBULATORY_CARE_PROVIDER_SITE_OTHER): Payer: BLUE CROSS/BLUE SHIELD | Admitting: Physician Assistant

## 2017-12-28 ENCOUNTER — Encounter: Payer: Self-pay | Admitting: Physician Assistant

## 2017-12-28 VITALS — BP 126/96 | HR 94 | Temp 99.0°F | Resp 16 | Ht 66.0 in | Wt 228.4 lb

## 2017-12-28 DIAGNOSIS — F329 Major depressive disorder, single episode, unspecified: Secondary | ICD-10-CM

## 2017-12-28 DIAGNOSIS — I10 Essential (primary) hypertension: Secondary | ICD-10-CM

## 2017-12-28 DIAGNOSIS — Z6836 Body mass index (BMI) 36.0-36.9, adult: Secondary | ICD-10-CM

## 2017-12-28 DIAGNOSIS — R7303 Prediabetes: Secondary | ICD-10-CM | POA: Diagnosis not present

## 2017-12-28 DIAGNOSIS — R6889 Other general symptoms and signs: Secondary | ICD-10-CM

## 2017-12-28 DIAGNOSIS — F32A Depression, unspecified: Secondary | ICD-10-CM

## 2017-12-28 DIAGNOSIS — L700 Acne vulgaris: Secondary | ICD-10-CM

## 2017-12-28 MED ORDER — FLUOXETINE HCL 20 MG PO CAPS: 20.0000 mg | ORAL_CAPSULE | Freq: Every day | ORAL | 3 refills | Status: DC

## 2017-12-28 MED ORDER — AMLODIPINE BESYLATE 10 MG PO TABS
10.0000 mg | ORAL_TABLET | Freq: Every day | ORAL | 3 refills | Status: DC
Start: 2017-12-28 — End: 2019-11-28

## 2017-12-28 MED ORDER — TRETINOIN 0.025 % EX CREA: TOPICAL_CREAM | Freq: Every day | CUTANEOUS | 11 refills | Status: DC

## 2017-12-28 NOTE — Patient Instructions (Signed)
     IF you received an x-ray today, you will receive an invoice from [facility]. Please contact [facility] at [phone] with questions or concerns regarding your invoice.   IF you received labwork today, you will receive an invoice from [facility]. Please contact [facility] at [phone] with questions or concerns regarding your invoice.   Our billing staff will not be able to assist you with questions regarding bills from these companies.  You will be contacted with the lab results as soon as they are available. The fastest way to get your results is to activate your My Chart account. Instructions are located on the last page of this paperwork. If you have not heard from us regarding the results in 2 weeks, please contact this office.     

## 2017-12-28 NOTE — Progress Notes (Signed)
12/28/2017 3:35 PM   DOB: 11-Aug-1970 / MRN: [id number]  SUBJECTIVE:  Audrey Avery is a 47 y.o. female presenting for sensitivity to the heat. Symptoms present for years now.  The problem is present only if it is above 80 degree outside.  She will will become very sweaty, feel nauseas, and often feel that she is short of breath. She has tried to reason with her management team at [facility] however they continue to send her out in the heat and state that she needs a medical exemption.   History of depression historically well controlled on fluoxetine 20 mg.  Denies SI and HI today.  Would like refills of this.  History of hypertension and takes Norvasc 10 mg.  Does not check ambulatory pressures.  Does have a history of prediabetes however very low-grade.  She is okay with lab work today.  We will screen lipids.  She complains of acne.  She has tried benzyl peroxide and this failed.  She would like to try another option today.    She is allergic to penicillins.   She  has a past medical history of Anemia and Hypertension.    She  reports that she has never smoked. She has never used smokeless tobacco. She reports that she does not drink alcohol or use drugs. She  has no sexual activity history on file. The patient  has a past surgical history that includes Cesarean section; Abdominal hysterectomy; Cholecystectomy; and Breast surgery.  Her family history includes Cancer in her father, mother, and sister; Diabetes in her father; Hypertension in her father, mother, and sister.  Review of Systems  Constitutional: Negative for chills, diaphoresis and fever.  Eyes: Negative.   Respiratory: Negative for cough, hemoptysis, sputum production, shortness of breath and wheezing.   Cardiovascular: Negative for chest pain, orthopnea and leg swelling.  Gastrointestinal: Negative for abdominal pain, blood in stool, constipation, diarrhea, heartburn, melena, nausea and vomiting.  Genitourinary:  Negative for dysuria, flank pain, frequency, hematuria and urgency.  Skin: Negative for rash.  Neurological: Negative for dizziness, sensory change, speech change, focal weakness and headaches.    The problem list and medications were reviewed and updated by myself where necessary and exist elsewhere in the encounter.   OBJECTIVE:  BP (!) 126/96 (BP Location: Left Arm, Patient Position: Sitting, Cuff Size: Large)   Pulse 94   Temp 99 F (37.2 C) (Oral)   Resp 16   Ht 5\' 6"  (1.676 m)   Wt 228 lb 6.4 oz (103.6 kg)   SpO2 98%   BMI 36.86 kg/m   Wt Readings from Last 3 Encounters:  12/28/17 228 lb 6.4 oz (103.6 kg)  09/06/17 277 lb 3.2 oz (125.7 kg)  07/13/17 220 lb (99.8 kg)   Temp Readings from Last 3 Encounters:  12/28/17 99 F (37.2 C) (Oral)  09/06/17 99.1 F (37.3 C) (Oral)  07/13/17 98.5 F (36.9 C) (Oral)   BP Readings from Last 3 Encounters:  12/28/17 (!) 126/96  09/06/17 136/90  07/14/17 (!) 160/94   Pulse Readings from Last 3 Encounters:  12/28/17 94  09/06/17 82  07/14/17 75    Physical Exam  Constitutional: She is oriented to person, place, and time. She appears well-nourished.  Non-toxic appearance. No distress.  HENT:  Head: Normocephalic and atraumatic.    Eyes: Pupils are equal, round, and reactive to light. EOM are normal.  Cardiovascular: Normal rate, regular rhythm, S1 normal, S2 normal, normal heart sounds and intact distal  pulses. Exam reveals no gallop, no friction rub and no decreased pulses.  No murmur heard. Pulmonary/Chest: Effort normal. No stridor. No respiratory distress. She has no wheezes. She has no rales.  Abdominal: She exhibits no distension.  Musculoskeletal: She exhibits no edema.  Neurological: She is alert and oriented to person, place, and time. She has normal strength and normal reflexes. She is not disoriented. She displays no atrophy. No cranial nerve deficit or sensory deficit. She exhibits normal muscle tone.  Coordination and gait normal.  Skin: Skin is warm and dry. She is not diaphoretic. No pallor.  Psychiatric: She has a normal mood and affect. Her behavior is normal.  Vitals reviewed.   Lab Results  Component Value Date   HGBA1C 6.5 (H) 09/20/2016    Lab Results  Component Value Date   WBC 7.2 07/13/2017   HGB 11.3 (L) 07/13/2017   HCT 35.5 (L) 07/13/2017   MCV 77.9 (L) 07/13/2017   PLT 286 07/13/2017    Lab Results  Component Value Date   CREATININE 0.57 07/13/2017   BUN 9 07/13/2017   NA 135 07/13/2017   K 3.5 07/13/2017   CL 102 07/13/2017   CO2 26 07/13/2017    Lab Results  Component Value Date   ALT 7 01/16/2013   AST 13 01/16/2013   ALKPHOS 51 01/16/2013   BILITOT <0.20 01/16/2013    Lab Results  Component Value Date   TSH 1.590 09/20/2016     ASSESSMENT AND PLAN:  Audia was seen today for heat and medication refill.  Diagnoses and all orders for this visit:  Heat sensitivity: Most likely secondary to BP medications as norvasc is a known arteriodilator which can be exacerbated by the heat.  Will attest that she can not work outside unless the temperature is under 80 degrees.  Secondary to obesity is also possible.  Essential hypertension -     amLODipine (NORVASC) 10 MG tablet; Take 1 tablet (10 mg total) by mouth daily. -     CBC -     Basic metabolic panel -     Amb Ref to Medical Weight Management  Prediabetes -     Hemoglobin A1c -     Lipid panel -     Amb Ref to Medical Weight M this also could be anagement  Depression, unspecified depression type              FLUoxetine (PROZAC) 20 MG capsule; Take 1 capsule (20 mg total) by mouth daily.  Acne vulgaris -     tretinoin (RETIN-A) 0.025 % cream; Apply topically at bedtime.  BMI 36.0-36.9,adult -     Amb Ref to Medical Weight Management      The patient is advised to call or return to clinic if she does not see an improvement in symptoms, or to seek the care of the closest  emergency department if she worsens with the above plan.   Philis Fendt, MHS, PA-C [facility] 12/28/2017 3:35 PM

## 2017-12-29 LAB — HEMOGLOBIN A1C
Est. average glucose Bld gHb Est-mCnc: 131 mg/dL
HEMOGLOBIN A1C: 6.2 % — AB (ref 4.8–5.6)

## 2017-12-29 LAB — BASIC METABOLIC PANEL
BUN/Creatinine Ratio: 14 (ref 9–23)
BUN: 9 mg/dL (ref 6–24)
CALCIUM: 9.5 mg/dL (ref 8.7–10.2)
CO2: 25 mmol/L (ref 20–29)
Chloride: 101 mmol/L (ref 96–106)
Creatinine, Ser: 0.66 mg/dL (ref 0.57–1.00)
GFR calc Af Amer: 124 mL/min/{1.73_m2} (ref >59)
GFR calc non Af Amer: 108 mL/min/{1.73_m2} (ref >59)
GLUCOSE: 83 mg/dL (ref 65–99)
POTASSIUM: 4 mmol/L (ref 3.5–5.2)
SODIUM: 140 mmol/L (ref 134–144)

## 2017-12-29 LAB — LIPID PANEL
CHOL/HDL RATIO: 3.3 ratio (ref 0.0–4.4)
CHOLESTEROL TOTAL: 170 mg/dL (ref 100–199)
HDL: 52 mg/dL (ref >39)
LDL Calculated: 105 mg/dL — ABNORMAL HIGH (ref 0–99)
TRIGLYCERIDES: 67 mg/dL (ref 0–149)
VLDL Cholesterol Cal: 13 mg/dL (ref 5–40)

## 2017-12-29 LAB — CBC
HEMOGLOBIN: 12.5 g/dL (ref 11.1–15.9)
Hematocrit: 40.1 % (ref 34.0–46.6)
MCH: 24.3 pg — ABNORMAL LOW (ref 26.6–33.0)
MCHC: 31.2 g/dL — ABNORMAL LOW (ref 31.5–35.7)
MCV: 78 fL — ABNORMAL LOW (ref 79–97)
PLATELETS: 330 10*3/uL (ref 150–450)
RBC: 5.14 x10E6/uL (ref 3.77–5.28)
RDW: 15.5 % — ABNORMAL HIGH (ref 12.3–15.4)
WBC: 5.6 10*3/uL (ref 3.4–10.8)

## 2018-01-01 ENCOUNTER — Telehealth: Payer: Self-pay | Admitting: Physician Assistant

## 2018-01-01 ENCOUNTER — Telehealth: Payer: Self-pay | Admitting: *Deleted

## 2018-01-01 NOTE — Telephone Encounter (Signed)
PA TRETINOIN KEY: [id number]

## 2018-01-01 NOTE — Telephone Encounter (Signed)
Patient needs Disability forms completed to give her certain job duties during the overly hot days this summer. I have completed the forms based off the OV notes and they just need to be signed I will place them in Michael's box on 01/01/18. Please return to the FMLA/Disability box at the [employer] checkout desk within 5-7 business days. Thank you!

## 2018-01-02 DIAGNOSIS — R7303 Prediabetes: Secondary | ICD-10-CM | POA: Diagnosis not present

## 2018-01-02 NOTE — Addendum Note (Signed)
Addended by: Benson Setting L on: 01/02/2018 11:09 AM   Modules accepted: Orders

## 2018-01-02 NOTE — Progress Notes (Signed)
Please add on an iron panel. Philis Fendt, MS, PA-C 11:05 AM, 01/02/2018

## 2018-01-03 LAB — IRON,TIBC AND FERRITIN PANEL
FERRITIN: 100 ng/mL (ref 15–150)
IRON SATURATION: 12 % — AB (ref 15–55)
IRON: 36 ug/dL (ref 27–159)
TIBC: 291 ug/dL (ref 250–450)
UIBC: 255 ug/dL (ref 131–425)

## 2018-01-03 NOTE — Telephone Encounter (Signed)
Forms scanned and faxed on 01/03/18

## 2018-01-17 DIAGNOSIS — Z0271 Encounter for disability determination: Secondary | ICD-10-CM

## 2018-03-15 ENCOUNTER — Encounter (INDEPENDENT_AMBULATORY_CARE_PROVIDER_SITE_OTHER): Payer: BLUE CROSS/BLUE SHIELD

## 2018-04-19 ENCOUNTER — Other Ambulatory Visit: Payer: Self-pay | Admitting: Physician Assistant

## 2018-04-24 NOTE — Telephone Encounter (Signed)
I can not find who prescribed this medication. She needs to request refill of this medication from prescribing provider. If she would like to start this medication, then she needs to make an appointment to discuss. thanks

## 2018-05-10 NOTE — Telephone Encounter (Signed)
Pt advised. She will call back and would like to get set up with a female provider.

## 2018-05-20 DIAGNOSIS — R053 Chronic cough: Secondary | ICD-10-CM

## 2018-05-20 HISTORY — DX: Chronic cough: R05.3

## 2018-08-29 IMAGING — CT CT ANGIO CHEST
2 of 6 series · 18 of 46 positions shown · IV contrast (ISOVUE)
Comparison: Chest 07/13/2017

CLINICAL DATA: Right-sided chest pain and shortness of breath since
11 o'clock yesterday. Worse with deep breathing, moving, and
walking,

EXAM:
CT ANGIOGRAPHY CHEST WITH CONTRAST
TECHNIQUE: Multidetector CT imaging of the chest was performed using the
standard protocol during bolus administration of intravenous
contrast. Multiplanar CT image reconstructions and MIPs were
obtained to evaluate the vascular anatomy.
CONTRAST:  100mL [id number] IOPAMIDOL ([id number]-RGJ) INJECTION 76%

[Series 5: thins · axial · 0.61mm/px · z∈[-313,-85]mm · 15 of 250 slices shown]
[im 11/250  lung]
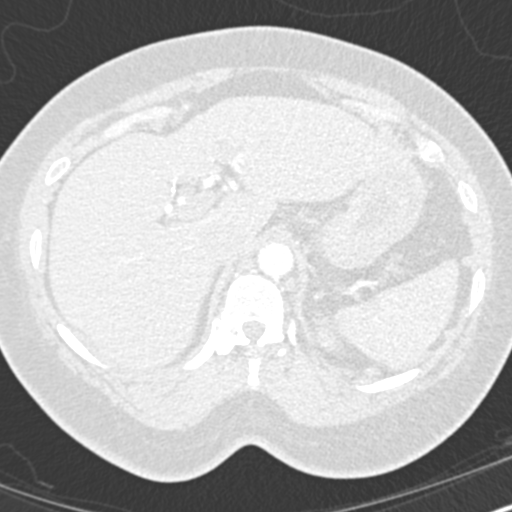
[im 33/250  soft-tissue]
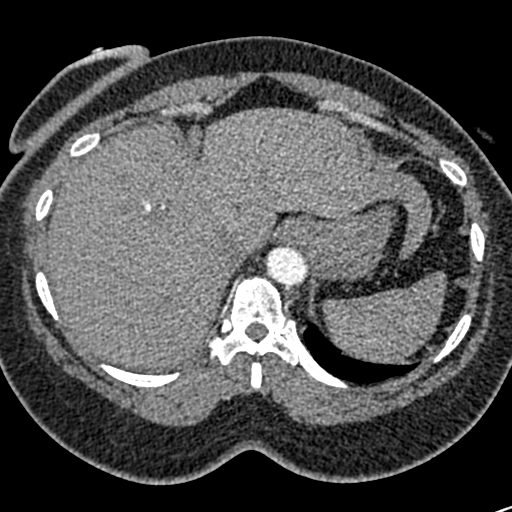
[im 44/250  lung]
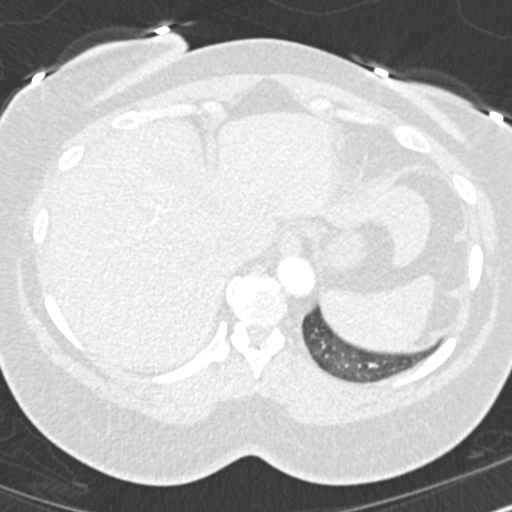
[im 65/250  soft-tissue]
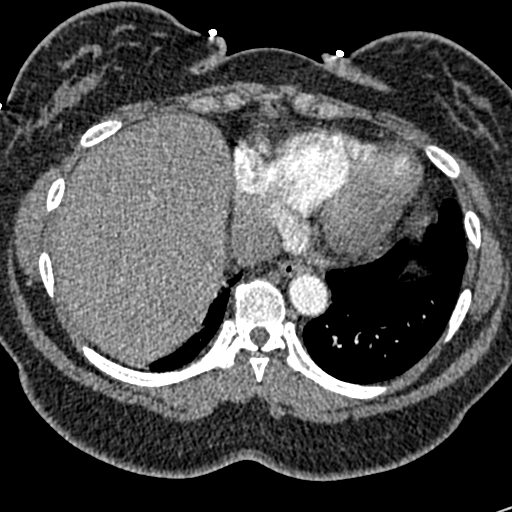
[im 76/250  lung]
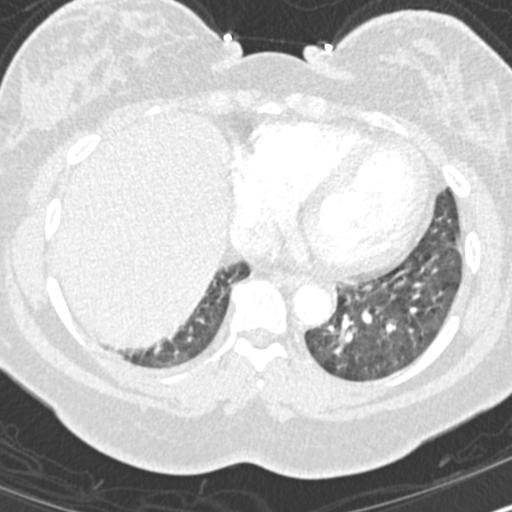
[im 98/250  soft-tissue]
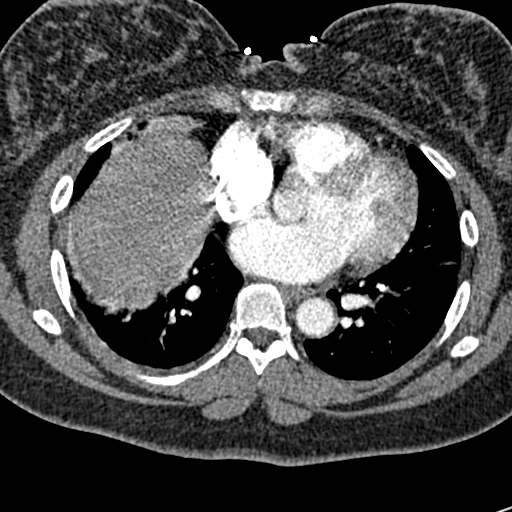
[im 109/250  lung]
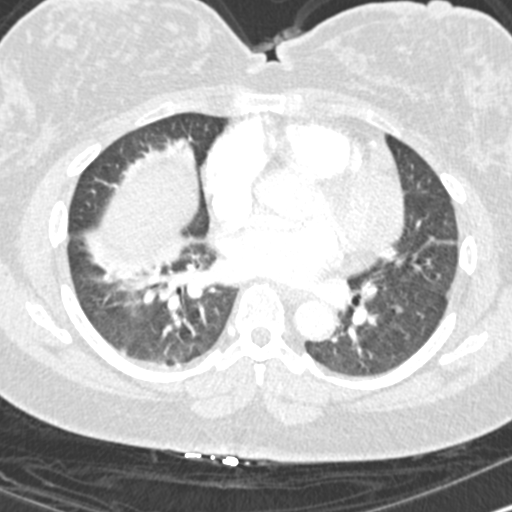
[im 130/250  soft-tissue]
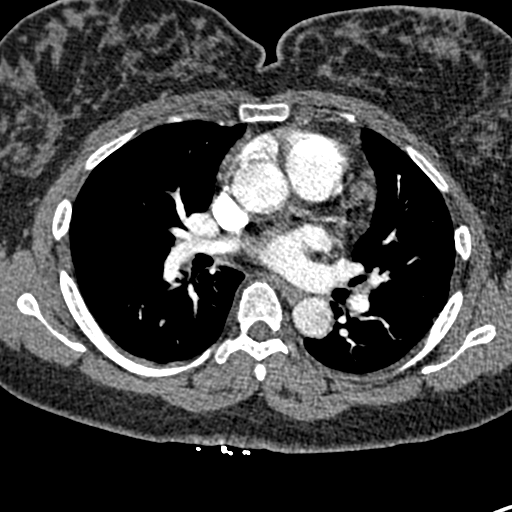
[im 141/250  lung]
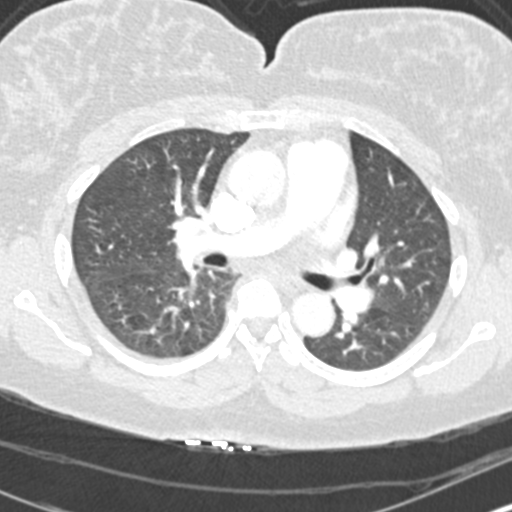
[im 152/250  soft-tissue]
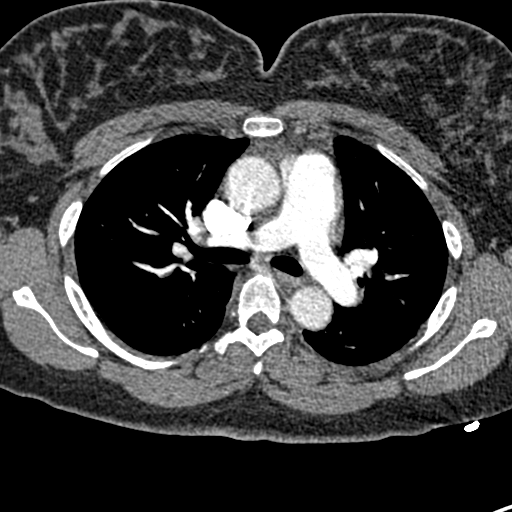
[im 174/250  lung]
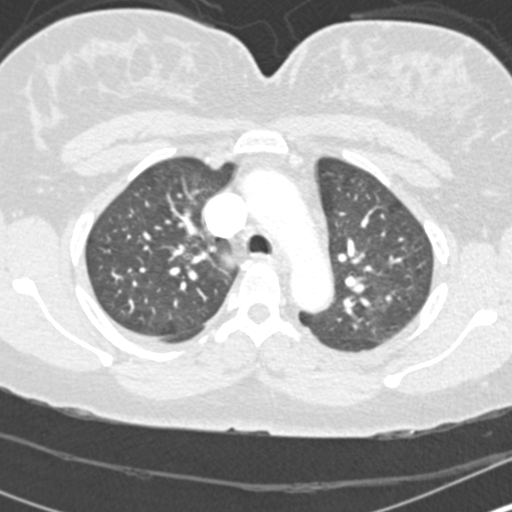
[im 185/250  soft-tissue]
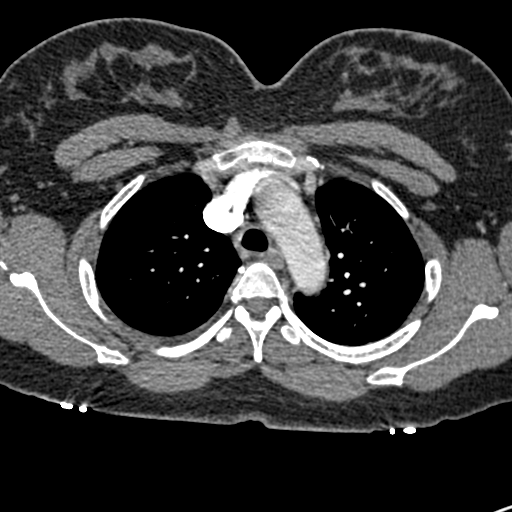
[im 206/250  lung]
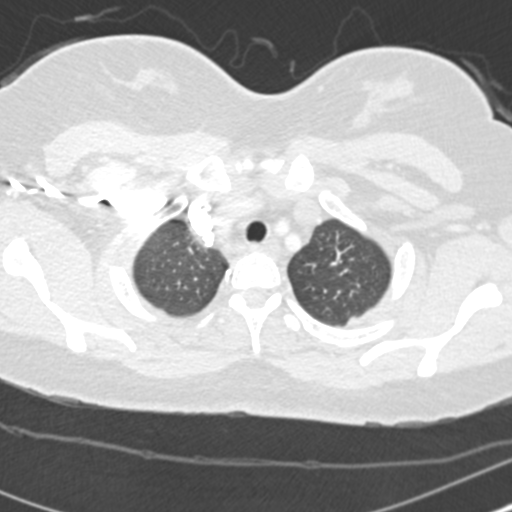
[im 217/250  soft-tissue]
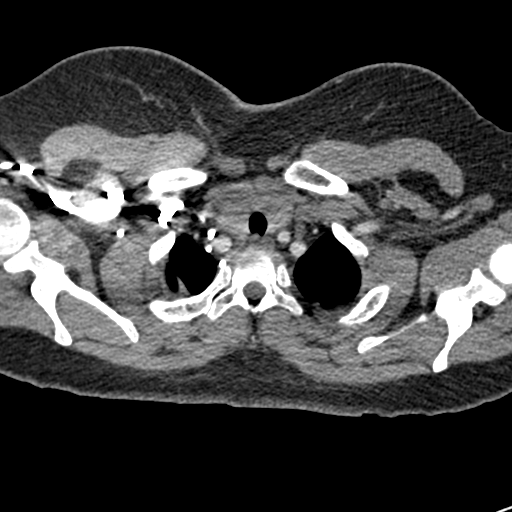
[im 239/250  lung]
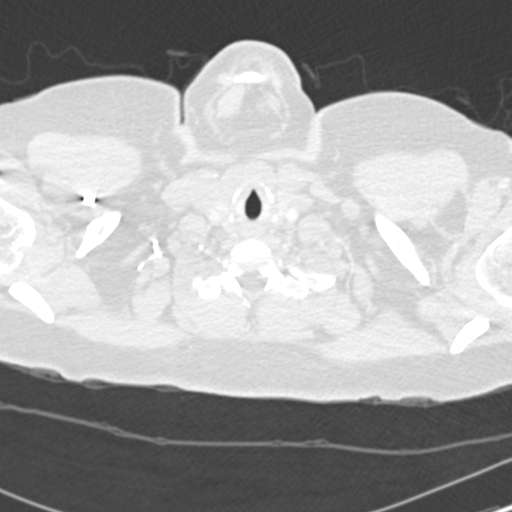

[Series 7: coronal mpr · coronal · 0.49mm/px · 3 of 151 slices shown]
[im 38/151  soft-tissue]
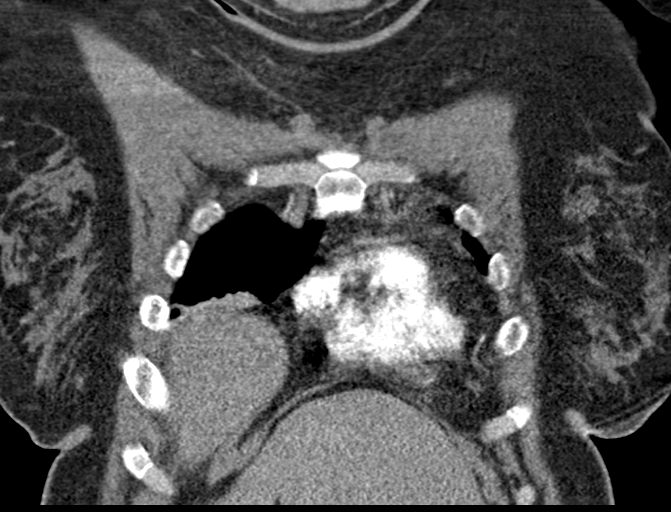
[im 76/151  soft-tissue]
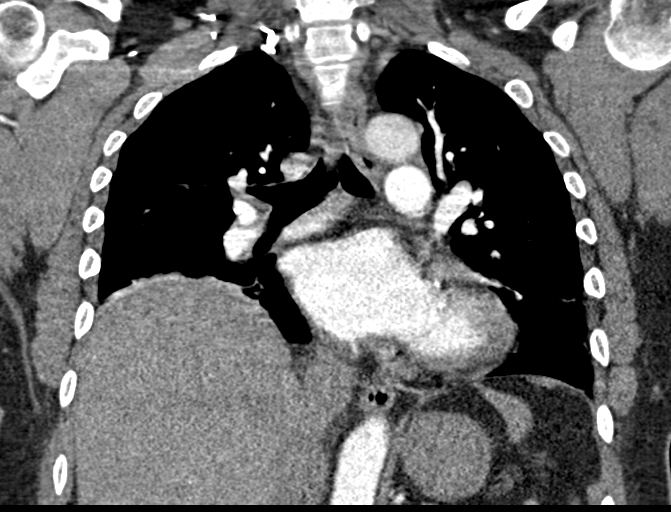
[im 113/151  soft-tissue]
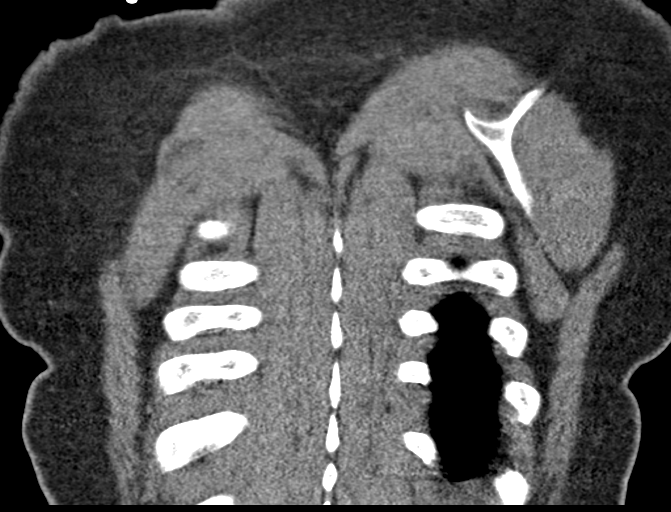

[18 of 46 positions shown; findings below may reference images not displayed]

FINDINGS: Cardiovascular: The study is somewhat technically limited due to
motion artifact. There is moderately good opacification of the
central and proximal segmental pulmonary arteries. No focal filling
defects are demonstrated. No evidence of significant pulmonary
embolus. Normal heart size. No pericardial effusion. Coronary artery
calcifications. Normal caliber thoracic aorta. Great vessel origins
are patent.

Mediastinum/Nodes: No enlarged mediastinal, hilar, or axillary lymph
nodes. Thyroid gland, trachea, and esophagus demonstrate no
significant findings.

Lungs/Pleura: Motion artifact limits evaluation. No airspace disease
or consolidation is suggested. No pleural effusions. No
pneumothorax. Mild atelectasis in the lung bases. Airways are
patent.

Upper Abdomen: Surgical absence of the gallbladder. No acute process
identified.

Musculoskeletal: No chest wall abnormality. No acute or significant
osseous findings.

Review of the MIP images confirms the above findings.
IMPRESSION: 1. No evidence of significant pulmonary embolus.
2. No evidence of active pulmonary disease.

## 2018-10-15 ENCOUNTER — Emergency Department (HOSPITAL_COMMUNITY): Payer: BLUE CROSS/BLUE SHIELD

## 2018-10-15 ENCOUNTER — Encounter (HOSPITAL_COMMUNITY): Payer: Self-pay

## 2018-10-15 ENCOUNTER — Emergency Department (HOSPITAL_COMMUNITY)
Admission: EM | Admit: 2018-10-15 | Discharge: 2018-10-15 | Disposition: A | Discharge status: Home / Self Care | Payer: BLUE CROSS/BLUE SHIELD | Attending: Emergency Medicine | Admitting: Emergency Medicine

## 2018-10-15 ENCOUNTER — Other Ambulatory Visit: Payer: Self-pay

## 2018-10-15 ENCOUNTER — Ambulatory Visit: Payer: Self-pay | Admitting: *Deleted

## 2018-10-15 DIAGNOSIS — R0602 Shortness of breath: Secondary | ICD-10-CM | POA: Diagnosis not present

## 2018-10-15 DIAGNOSIS — R42 Dizziness and giddiness: Secondary | ICD-10-CM | POA: Diagnosis not present

## 2018-10-15 DIAGNOSIS — I10 Essential (primary) hypertension: Secondary | ICD-10-CM | POA: Diagnosis not present

## 2018-10-15 DIAGNOSIS — R05 Cough: Secondary | ICD-10-CM | POA: Diagnosis not present

## 2018-10-15 DIAGNOSIS — R059 Cough, unspecified: Secondary | ICD-10-CM

## 2018-10-15 DIAGNOSIS — Z79899 Other long term (current) drug therapy: Secondary | ICD-10-CM | POA: Diagnosis not present

## 2018-10-15 LAB — BASIC METABOLIC PANEL
Anion gap: 7 (ref 5–15)
BUN: 12 mg/dL (ref 6–20)
CO2: 27 mmol/L (ref 22–32)
Calcium: 8.7 mg/dL — ABNORMAL LOW (ref 8.9–10.3)
Chloride: 104 mmol/L (ref 98–111)
Creatinine, Ser: 0.73 mg/dL (ref 0.44–1.00)
GFR calc Af Amer: >60 mL/min (ref >60)
GFR calc non Af Amer: >60 mL/min (ref >60)
Glucose, Bld: 106 mg/dL — ABNORMAL HIGH (ref 70–99)
Potassium: 3.6 mmol/L (ref 3.5–5.1)
Sodium: 138 mmol/L (ref 135–145)

## 2018-10-15 LAB — CBC
HCT: 40 % (ref 36.0–46.0)
Hemoglobin: 12.1 g/dL (ref 12.0–15.0)
MCH: 24.3 pg — ABNORMAL LOW (ref 26.0–34.0)
MCHC: 30.3 g/dL (ref 30.0–36.0)
MCV: 80.3 fL (ref 80.0–100.0)
Platelets: 298 10*3/uL (ref 150–400)
RBC: 4.98 MIL/uL (ref 3.87–5.11)
RDW: 15.8 % — ABNORMAL HIGH (ref 11.5–15.5)
WBC: 6.5 10*3/uL (ref 4.0–10.5)
nRBC: 0 % (ref 0.0–0.2)

## 2018-10-15 LAB — BRAIN NATRIURETIC PEPTIDE: B Natriuretic Peptide: 33.8 pg/mL (ref 0.0–100.0)

## 2018-10-15 MED ORDER — FLUTICASONE PROPIONATE HFA 110 MCG/ACT IN AERO: 1.0000 | INHALATION_SPRAY | Freq: Two times a day (BID) | RESPIRATORY_TRACT | 12 refills | Status: DC

## 2018-10-15 MED ORDER — BENZONATATE 100 MG PO CAPS: 100.0000 mg | ORAL_CAPSULE | Freq: Three times a day (TID) | ORAL | 0 refills | Status: DC

## 2018-10-15 MED ORDER — LORATADINE 10 MG PO TABS: 10.0000 mg | ORAL_TABLET | Freq: Every day | ORAL | 0 refills | Status: DC

## 2018-10-15 NOTE — ED Notes (Signed)
Bed: WA12 Expected date:  Expected time:  Means of arrival:  Comments: Negative pressure 

## 2018-10-15 NOTE — ED Notes (Signed)
Xray bedside.

## 2018-10-15 NOTE — Discharge Instructions (Addendum)
Take the medications as prescribed, follow-up with your doctor if the symptoms are not improving when you are able, return as needed for worsening symptoms

## 2018-10-15 NOTE — ED Triage Notes (Signed)
Pt reports dry cough since December. SOB x3 weeks. Reports taking OTC cough medication with no relief.

## 2018-10-15 NOTE — ED Provider Notes (Signed)
[facility] DEPT Provider Note   CSN: [id number] Arrival date & time: 10/15/18  1110    History   Chief Complaint Chief Complaint  Patient presents with  . Cough  . Shortness of Breath    HPI Seven CORABELLE SPACKMAN is a 48 y.o. female.     HPI Pt has had a cough since December.  It has been getting worse over the last couple of months. She has incontinence when she coughs.  She gets lightheaded with coughing.    She feels short of breath now.  It got a little better recently but then got worse again.  She called her doctor and was told to come to the ED.  She has noticed some swelling in her legs.  No fever.  Some rhinitis and nasal congestion.  No trouble with her smell or taste. Past Medical History:  Diagnosis Date  . Anemia   . Hypertension     Patient Active Problem List   Diagnosis Date Noted  . Anemia, unspecified 02/24/2013    Past Surgical History:  Procedure Laterality Date  . ABDOMINAL HYSTERECTOMY    . BREAST SURGERY    . CESAREAN SECTION    . CHOLECYSTECTOMY       OB History   No obstetric history on file.      Home Medications    Prior to Admission medications   Medication Sig Start Date End Date Taking? Authorizing Provider  amLODipine (NORVASC) 10 MG tablet Take 1 tablet (10 mg total) by mouth daily. 12/28/17   Tereasa Coop, PA-C  benzonatate (TESSALON) 100 MG capsule Take 1 capsule (100 mg total) by mouth every 8 (eight) hours. 10/15/18   Dorie Rank, MD  FLUoxetine (PROZAC) 20 MG capsule Take 1 capsule (20 mg total) by mouth daily. 12/28/17   Tereasa Coop, PA-C  fluticasone (FLOVENT HFA) 110 MCG/ACT inhaler Inhale 1 puff into the lungs 2 (two) times a day. 10/15/18   Dorie Rank, MD  loratadine (CLARITIN) 10 MG tablet Take 1 tablet (10 mg total) by mouth daily. 10/15/18   Dorie Rank, MD  tretinoin (RETIN-A) 0.025 % cream Apply topically at bedtime. 12/28/17   Tereasa Coop, PA-C    Family History Family  History  Problem Relation Age of Onset  . Cancer Mother   . Hypertension Mother   . Diabetes Father   . Cancer Father   . Hypertension Father   . Cancer Sister   . Hypertension Sister     Social History Social History   Tobacco Use  . Smoking status: Never Smoker  . Smokeless tobacco: Never Used  Substance Use Topics  . Alcohol use: No  . Drug use: No     Allergies   Penicillins   Review of Systems Review of Systems  All other systems reviewed and are negative.    Physical Exam Updated Vital Signs BP (!) 149/93   Pulse 78   Temp 98.8 F (37.1 C) (Oral)   Resp 18   SpO2 95%   Physical Exam Vitals signs and nursing note reviewed.  Constitutional:      General: She is not in acute distress.    Appearance: She is well-developed.  HENT:     Head: Normocephalic and atraumatic.     Right Ear: External ear normal.     Left Ear: External ear normal.  Eyes:     General: No scleral icterus.       Right eye: No discharge.  Left eye: No discharge.     Conjunctiva/sclera: Conjunctivae normal.  Neck:     Musculoskeletal: Neck supple.     Trachea: No tracheal deviation.  Cardiovascular:     Rate and Rhythm: Normal rate and regular rhythm.  Pulmonary:     Effort: Pulmonary effort is normal. No respiratory distress.     Breath sounds: Normal breath sounds. No stridor. No wheezing or rales.  Abdominal:     General: Bowel sounds are normal. There is no distension.     Palpations: Abdomen is soft.     Tenderness: There is no abdominal tenderness. There is no guarding or rebound.  Musculoskeletal:        General: No tenderness.  Skin:    General: Skin is warm and dry.     Findings: No rash.  Neurological:     Mental Status: She is alert.     Cranial Nerves: No cranial nerve deficit (no facial droop, extraocular movements intact, no slurred speech).     Sensory: No sensory deficit.     Motor: No abnormal muscle tone or seizure activity.     Coordination:  Coordination normal.      ED Treatments / Results  Labs (all labs ordered are listed, but only abnormal results are displayed) Labs Reviewed  CBC - Abnormal; Notable for the following components:      Result Value   MCH 24.3 (*)    RDW 15.8 (*)    All other components within normal limits  BASIC METABOLIC PANEL - Abnormal; Notable for the following components:   Glucose, Bld 106 (*)    Calcium 8.7 (*)    All other components within normal limits  BRAIN NATRIURETIC PEPTIDE    EKG None  Radiology Dg Chest Portable 1 View  Result Date: 10/15/2018 CLINICAL DATA:  Cough, shortness of breath. EXAM: PORTABLE CHEST 1 VIEW COMPARISON:  CT scan and radiographs of July 13, 2017. FINDINGS: The heart size and mediastinal contours are within normal limits. Both lungs are clear. No pneumothorax or pleural effusion is noted. The visualized skeletal structures are unremarkable. IMPRESSION: No active disease. Electronically Signed   By: Marijo Conception M.D.   On: 10/15/2018 12:07    Procedures Procedures (including critical care time)  Medications Ordered in ED Medications - No data to display   Initial Impression / Assessment and Plan / ED Course  I have reviewed the triage vital signs and the nursing notes.  Pertinent labs & imaging results that were available during my care of the patient were reviewed by me and considered in my medical decision making (see chart for details).    Patient presented to the ED for evaluation of persistent cough and complaints of shortness of breath.  Patient's examination is reassuring.  She is not tachypneic.  She is not hypoxic.  Her lungs sounded clear.  Doubt PE or DVT paced on physical exam and clinical presentation.  No signs of congestive heart failure based on her laboratory tests and x-rays.  Symptoms are suggestive of the possibility allergic type daily allergy versus an infectious etiology.  I think is reasonable to try a course of  antihistamines and steroids.  Covid 19 illness was certainly a consideration but the patient is not having any fevers.  She denies body aches.  She denies anosmia or dysgeusia.  I have a low suspicion for acute covid illness but is certainly still a possibility.  SHERY WAUNEKA was evaluated in Emergency Department on 10/15/2018  for the symptoms described in the history of present illness. She was evaluated in the context of the global COVID-19 pandemic, which necessitated consideration that the patient might be at risk for infection with the SARS-CoV-2 virus that causes COVID-19. Institutional protocols and algorithms that pertain to the evaluation of patients at risk for COVID-19 are in a state of rapid change based on information released by regulatory bodies including the CDC and federal and state organizations. These policies and algorithms were followed during the patient's care in the ED.   Final Clinical Impressions(s) / ED Diagnoses   Final diagnoses:  Cough    ED Discharge Orders         Ordered    benzonatate (TESSALON) 100 MG capsule  Every 8 hours     10/15/18 1254    fluticasone (FLOVENT HFA) 110 MCG/ACT inhaler  2 times daily     10/15/18 1254    loratadine (CLARITIN) 10 MG tablet  Daily     10/15/18 1254           Dorie Rank, MD 10/15/18 1313

## 2018-10-15 NOTE — Telephone Encounter (Signed)
Patient has been using Rx'd cough syrup and OTC cough syrup- since December. Patient has been wearing incontinence pads and the cough get so bad some times that she can't get air.   Reason for Disposition . [1] Continuous (nonstop) coughing interferes with work or school AND [2] no improvement using cough treatment per protocol . MODERATE difficulty breathing (e.g., speaks in phrases, SOB even at rest, pulse 100-120)    Call to ED- let them know sending patient over- she is cough with shallow breathing  Answer Assessment - Initial Assessment Questions 1. ONSET: "When did the cough begin?"      Patient reports she has had cough since December 2. SEVERITY: "How bad is the cough today?"      Patient is coughing at 7-8 on scale of 1-10 3. RESPIRATORY DISTRESS: "Describe your breathing."      Patient feels like she is out of breath most of the time- since December 4. FEVER: "Do you have a fever?" If so, ask: "What is your temperature, how was it measured, and when did it start?"     No fever- she did have fever in December 5. HEMOPTYSIS: "Are you coughing up any blood?" If so ask: "How much?" (flecks, streaks, tablespoons, etc.)     No sputum 6. TREATMENT: "What have you done so far to treat the cough?" (e.g., meds, fluids, humidifier)     No- she had been using OTC treatments but stopped 7. CARDIAC HISTORY: "Do you have any history of heart disease?" (e.g., heart attack, congestive heart failure)      no 8. LUNG HISTORY: "Do you have any history of lung disease?"  (e.g., pulmonary embolus, asthma, emphysema)     no 9. PE RISK FACTORS: "Do you have a history of blood clots?" (or: recent major surgery, recent prolonged travel, bedridden)     no 10. OTHER SYMPTOMS: "Do you have any other symptoms? (e.g., runny nose, wheezing, chest pain)       Last week- nose runny- has stopped 11. PREGNANCY: "Is there any chance you are pregnant?" "When was your last menstrual period?"       n/a 12. TRAVEL:  "Have you traveled out of the country in the last month?" (e.g., travel history, exposures)       No travel- no exposure- patient is still working  Answer Assessment - Initial Assessment Questions 1. COVID-19 DIAGNOSIS: "Who made your Coronavirus (COVID-19) diagnosis?" "Was it confirmed by a positive lab test?" If not diagnosed by a HCP, ask "Are there lots of cases (community spread) where you live?" (See public health department website, if unsure)   * MAJOR community spread: high number of cases; numbers of cases are increasing; many people hospitalized.   * MINOR community spread: low number of cases; not increasing; few or no people hospitalized     Minor- community spread 2. ONSET: "When did the COVID-19 symptoms start?"      Patient has had cough since December- breathing has changed within the last month 3. WORST SYMPTOM: "What is your worst symptom?" (e.g., cough, fever, shortness of breath, muscle aches)     Cough, breathing- hard- more shallow 4. COUGH: "How bad is the cough?"       Dry hacking cough- takes breath at times 5. FEVER: "Do you have a fever?" If so, ask: "What is your temperature, how was it measured, and when did it start?"     No fever 6. RESPIRATORY STATUS: "Describe your breathing?" (e.g., shortness of breath,  wheezing, unable to speak)      Labored breathing without cough 7. BETTER-SAME-WORSE: "Are you getting better, staying the same or getting worse compared to yesterday?"  If getting worse, ask, "In what way?"     Cough is worse, fatigue is worse, breathing worse 8. HIGH RISK DISEASE: "Do you have any chronic medical problems?" (e.g., asthma, heart or lung disease, weak immune system, etc.)     High BP 9. PREGNANCY: "Is there any chance you are pregnant?" "When was your last menstrual period?"     n/a 10. OTHER SYMPTOMS: "Do you have any other symptoms?"  (e.g., runny nose, headache, sore throat, loss of smell)       Headache  Protocols used: COUGH - ACUTE  NON-PRODUCTIVE-A-AH, CORONAVIRUS (COVID-19) DIAGNOSED OR SUSPECTED-A-AH

## 2018-10-15 NOTE — Telephone Encounter (Signed)
Summary: cough    Patient called with complaints of a cough x "months" that comes and goes. Patient denies any other symptoms. She is requesting call back from nurse to discuss what she is able to take OTC to alleviate symptoms. Please advise.      Attempted to call patient- left message

## 2019-01-14 ENCOUNTER — Telehealth: Payer: BLUE CROSS/BLUE SHIELD | Admitting: Family

## 2019-01-14 DIAGNOSIS — R059 Cough, unspecified: Secondary | ICD-10-CM

## 2019-01-14 DIAGNOSIS — R05 Cough: Secondary | ICD-10-CM

## 2019-01-14 NOTE — Progress Notes (Signed)
Based on what you shared with me, I feel your condition warrants further evaluation and I recommend that you be seen for a face to face office visit.  Since this cough is chronic, you need to be seen by your PCP for further work-up. It could be some type of allergy, acid reflux, or asthma?    NOTE: If you entered your credit card information for this eVisit, you will not be charged. You may see a "hold" on your card for the $35 but that hold will drop off and you will not have a charge processed.  If you are having a true medical emergency please call 911.     For an urgent face to face visit, [facility] has five urgent care centers for your convenience:    [email or web address] to reserve your spot online an avoid wait times  [facility] (New Address!) [address] [place], [place] [address] *Just off [address], across the road from [place] hours of operation: Monday-Friday, 12 PM to 6 PM  Closed Saturday & Sunday   The following sites will take your insurance:  . [facility]    [phone]                  Get Driving Directions  [address], [place] [address] . 10 am to 8 pm Monday-Friday . 12 pm to 8 pm Saturday-Sunday   . [facility] at [place]                  Get Driving Directions  [address], [place], [place] [address] . 8 am to 8 pm Monday-Friday . 9 am to 6 pm Saturday . 11 am to 6 pm Sunday   . [facility] at [place]                  Get Driving Directions   [address], [place] [address] . 8 am to 8 pm Monday-Friday . 8 am to 4 pm Saturday-Sunday    . [facility] at [place]                    Get Driving Directions  [phone]  [address] [place],  [address]  . Monday-Friday, 12 PM to 6 PM    Your  e-visit answers were reviewed by a board certified advanced clinical practitioner to complete your personal care plan.  Thank you for using e-Visits.

## 2019-01-27 ENCOUNTER — Other Ambulatory Visit: Payer: Self-pay | Admitting: Physician Assistant

## 2019-01-27 DIAGNOSIS — I10 Essential (primary) hypertension: Secondary | ICD-10-CM

## 2019-01-27 DIAGNOSIS — L700 Acne vulgaris: Secondary | ICD-10-CM

## 2019-01-28 NOTE — Telephone Encounter (Signed)
Requested medications are due for refill today?  Yes  Requested medications are on the active medication list?  Yes  Last refill 12/28/2017  Future visit scheduled?  No  Notes to clinic:  Left voicemail for patient to call to schedule appointment with PCP.  Will forward to [place] to address as patient has only seen Philis Fendt PA at their office.   Requested Prescriptions  Pending Prescriptions Disp Refills   FLUoxetine (PROZAC) 20 MG capsule [Pharmacy Med Name: FLUOXETINE HCL 20 MG CAPSULE] 90 capsule 3    Sig: TAKE 1 CAPSULE BY MOUTH EVERY DAY     Psychiatry:  Antidepressants - SSRI Failed - 01/27/2019  8:36 PM      Failed - Valid encounter within last 6 months    Recent Outpatient Visits          1 year ago Heat sensitivity   [facility] [place], PA-C   1 year ago Cough   [facility] [place], PA-C   1 year ago Multiple allergies   [facility] [place], PA-C   1 year ago Acute non intractable tension-type headache   [facility] [place], PA-C   2 years ago Current mild episode of major depressive disorder without prior episode [facility])   [facility] [place], PA-C             Failed - Completed PHQ-2 or PHQ-9 in the last 360 days.       tretinoin (RETIN-A) 0.025 % cream [Pharmacy Med Name: TRETINOIN 0.025% CREAM] 40 g 11    Sig: APPLY TO AFFECTED AREA EVERY DAY AT BEDTIME     Dermatology:  Acne preparations Failed - 01/27/2019  8:36 PM      Failed - Valid encounter within last 12 months    Recent Outpatient Visits          1 year ago Heat sensitivity   [facility] [place], PA-C   1 year ago Cough   [facility] [place], PA-C   1 year ago Multiple allergies   [facility], PA-C   1 year ago Acute non intractable tension-type headache   [facility] [place], PA-C   2 years ago Current  mild episode of major depressive disorder without prior episode [facility])   [facility] [place], Audrie Lia, PA-C              amLODipine (NORVASC) 10 MG tablet [Pharmacy Med Name: AMLODIPINE BESYLATE 10 MG TAB] 90 tablet 3    Sig: TAKE 1 TABLET BY MOUTH EVERY DAY     Cardiovascular:  Calcium Channel Blockers Failed - 01/27/2019  8:36 PM      Failed - Last BP in normal range    BP Readings from Last 1 Encounters:  10/15/18 (!) 149/93         Failed - Valid encounter within last 6 months    Recent Outpatient Visits          1 year ago Heat sensitivity   [facility] [place], PA-C   1 year ago Cough   [facility] [place], PA-C   1 year ago Multiple allergies   [facility] [place], PA-C   1 year ago Acute non intractable tension-type headache  [facility] [place], PA-C   2 years ago Current mild episode of major depressive disorder without prior episode [facility])   [facility] [place], PA-C

## 2019-02-20 ENCOUNTER — Telehealth (INDEPENDENT_AMBULATORY_CARE_PROVIDER_SITE_OTHER): Payer: BC Managed Care – PPO | Admitting: Family Medicine

## 2019-02-20 ENCOUNTER — Encounter: Payer: Self-pay | Admitting: Family Medicine

## 2019-02-20 VITALS — Ht 66.0 in | Wt 240.0 lb

## 2019-02-20 DIAGNOSIS — N393 Stress incontinence (female) (male): Secondary | ICD-10-CM

## 2019-02-20 DIAGNOSIS — R351 Nocturia: Secondary | ICD-10-CM

## 2019-02-20 DIAGNOSIS — R058 Other specified cough: Secondary | ICD-10-CM

## 2019-02-20 DIAGNOSIS — R059 Cough, unspecified: Secondary | ICD-10-CM

## 2019-02-20 DIAGNOSIS — R05 Cough: Secondary | ICD-10-CM

## 2019-02-20 MED ORDER — FLUTICASONE PROPIONATE 50 MCG/ACT NA SUSP: 1.0000 | Freq: Every day | NASAL | 1 refills | Status: DC

## 2019-02-20 MED ORDER — OMEPRAZOLE 20 MG PO CPDR: 20.0000 mg | DELAYED_RELEASE_CAPSULE | Freq: Every day | ORAL | 1 refills | Status: DC

## 2019-02-20 NOTE — Progress Notes (Signed)
TOC getting to know the provider.  Has a cold in dec and still coughing and lose control of bladder.   Chronic cough and cant control bladder well. Gets up multiple time a night and goes to the restroom. Feels like she has an overactive bladder.

## 2019-02-20 NOTE — Progress Notes (Signed)
Virtual Visit via Telephone Note  I connected with Audrey Avery on 02/20/19 at 6:23 PM by telephone and verified that I am speaking with the correct person using two identifiers.   I discussed the limitations, risks, security and privacy concerns of performing an evaluation and management service by telephone and the availability of in person appointments. I also discussed with the patient that there may be a patient responsible charge related to this service. The patient expressed understanding and agreed to proceed, consent obtained  Chief complaint: Cough, urinary issues.  History of Present Illness: Audrey Avery is a 48 y.o. female New patient to me, previously followed by Audrey Avery with last visit in July 2019.  Today with 2 concerns:  Cough: ED visit July 27 reviewed, recommend a face-to-face office visit. Has been treated with Prozac for depression, amlodipine 10 mg daily for hypertension. April 27 ER note reviewed, reported cough since December at that time, progressive over the previous few months.  Did report incontinence with coughing at that time. cough/shortness of breath, 1 view chest x-ray no active disease.  Normal BNP.  Treated with fluticasone INH,  benzonatate, loratadine. Sick 2 weeks before Christmas.  Cough since December. Same cough for 9 months.  Some shortness of breath since April. Has to catch breath at times. Has been using fluticasone 180mcg inh BID - has not noticed changes. No chest pain. No known hx of CAD/CHF.  Dry cough. No phlegm, no blood.  coughing fits at times to point of dizziness.  Cough during the day and night at times. Wearing incontinence past 9 months with some incontinence with this cough. Nonsmoker.  No fever.  No ACE-I, only amlodipine - no missed doses.  No congestion/runny nose. Some tickle in throat with lying down a times.  No heartburn. Worse cough with meals, some cough with lying down. Cough during the night.   Never took claritin, no current claritin or nasal sprays.  No relief with tessalon.  No recent gum/mints during the day.   Possible overactive bladder:  Incontinence with cough as above. Nocturia few times per night for months, no dysuria, abd pain or fever.  No home blood sugar readings.  Some increased thirst at times past 2-3 weeks.  Sister has blood sugar meter.  No blurry vision, abdominal pain, nausea or vomiting.  Lab Results  Component Value Date   HGBA1C 6.2 (H) 12/28/2017          Patient Active Problem List   Diagnosis Date Noted  . Anemia, unspecified 02/24/2013   Past Medical History:  Diagnosis Date  . Anemia   . Hypertension    Past Surgical History:  Procedure Laterality Date  . ABDOMINAL HYSTERECTOMY    . BREAST SURGERY    . CESAREAN SECTION    . CHOLECYSTECTOMY     Allergies  Allergen Reactions  . Penicillins Anaphylaxis and Swelling    Has patient had a PCN reaction causing immediate rash, facial/tongue/throat swelling, SOB or lightheadedness with hypotension: Yes Has patient had a PCN reaction causing severe rash involving mucus membranes or skin necrosis: No Has patient had a PCN reaction that required hospitalization No Has patient had a PCN reaction occurring within the last 10 years: No If all of the above answers are "NO", then may proceed with Cephalosporin use.    Prior to Admission medications   Medication Sig Start Date End Date Taking? Authorizing Provider  amLODipine (NORVASC) 10 MG tablet Take 1 tablet (10 mg  total) by mouth daily. 12/28/17  Yes Tereasa Coop, PA-C  FLUoxetine (PROZAC) 20 MG capsule Take 1 capsule (20 mg total) by mouth daily. 12/28/17  Yes Tereasa Coop, PA-C  fluticasone (FLOVENT HFA) 110 MCG/ACT inhaler Inhale 1 puff into the lungs 2 (two) times a day. 10/15/18  Yes Dorie Rank, MD  tretinoin (RETIN-A) 0.025 % cream Apply topically at bedtime. 12/28/17  Yes Tereasa Coop, PA-C  loratadine (CLARITIN) 10 MG  tablet Take 1 tablet (10 mg total) by mouth daily. Patient not taking: Reported on 02/20/2019 10/15/18   Dorie Rank, MD   Social History   Socioeconomic History  . Marital status: Single    Spouse name: Not on file  . Number of children: Not on file  . Years of education: Not on file  . Highest education level: Not on file  Occupational History  . Not on file  Social Needs  . Financial resource strain: Not on file  . Food insecurity    Worry: Not on file    Inability: Not on file  . Transportation needs    Medical: Not on file    Non-medical: Not on file  Tobacco Use  . Smoking status: Never Smoker  . Smokeless tobacco: Never Used  Substance and Sexual Activity  . Alcohol use: No  . Drug use: No  . Sexual activity: Not on file  Lifestyle  . Physical activity    Days per week: Not on file    Minutes per session: Not on file  . Stress: Not on file  Relationships  . Social Herbalist on phone: Not on file    Gets together: Not on file    Attends religious service: Not on file    Active member of club or organization: Not on file    Attends meetings of clubs or organizations: Not on file    Relationship status: Not on file  . Intimate partner violence    Fear of current or ex partner: Not on file    Emotionally abused: Not on file    Physically abused: Not on file    Forced sexual activity: Not on file  Other Topics Concern  . Not on file  Social History Narrative  . Not on file     Observations/Objective: Speaking normally without distress throughout visit.  2-3 times of brief cough during discussion.  No distress, appropriate responses, understanding expressed of plan.  All questions answered.  Vitals:   02/20/19 1510  Weight: 240 lb (108.9 kg)  Height: 5\' 6"  (1.676 m)     Assessment and Plan: Cough Upper airway cough syndrome  -Longstanding nonproductive cough.  Based on description appears to have likely upper airway cough syndrome/irritant  cough.  No significant change with Flovent HFA, doubt reactive airway.  X-ray, BNP were reassuring in April with same cough at that time, doubt CHF.  No chest pain.  Doubt cardiac.  Has not had specific treatment for postnasal drip from allergies or from possible irritant from laryngeal pharyngeal reflux.  Less likely infectious given timing of symptoms.  -Start Claritin, Flonase, omeprazole.  Triggers for reflux given on handout for avoidance.  Recheck in office with exam in 1 week with ER precautions given if any acute worsening of symptoms sooner.  Urinary, incontinence, stress female Nocturia  -Suspected stress incontinence with cough.  Some nocturia/frequency noted, also appears to be an ongoing issue.  Possible overactive bladder but previous prediabetes/hyperglycemia.  -Treatment for cough  as above, instructions given for incontinence  -Plan on in office testing in 1 week, but check blood sugar prior to that time and if significant elevation should be seen sooner.  -  ER precautions given if acute worsening symptoms prior to visit next week  Follow Up Instructions:    I discussed the assessment and treatment plan with the patient. The patient was provided an opportunity to ask questions and all were answered. The patient agreed with the plan and demonstrated an understanding of the instructions.   The patient was advised to call back or seek an in-person evaluation if the symptoms worsen or if the condition fails to improve as anticipated.  I provided 26 minutes of non-face-to-face time during this encounter, with 46min chart review.   Signed,   Merri Ray, MD [facility] [place] [facility].  02/20/19

## 2019-02-20 NOTE — Patient Instructions (Addendum)
For dry cough - try starting claritin over the counter once per day and prescribed flonase for possible postnasal drip.  Also start omeprazole for possible reflux or heartburn cause of dry cough. Recheck in office in 1 week. If any worsening symptoms during that time - be seen seen sooner or emergency room.    See information on incontinence below. Cough may be causing some of the issue, so controlling that is first step. We can check some blood tests including blood sugar next week, but try to check blood sugar at home. If reading over 200 or unable to read - be seen sooner.   Return to the clinic or go to the nearest emergency room if any of your symptoms worsen or new symptoms occur.   Urinary Incontinence  Urinary incontinence refers to a condition in which a person is unable to control where and when to pass urine. A person with this condition will urinate when he or she does not mean to (involuntarily). What are the causes? This condition may be caused by:  Medicines.  Infections.  Constipation.  Overactive bladder muscles.  Weak bladder muscles.  Weak pelvic floor muscles. These muscles provide support for the bladder, intestine, and, in women, the uterus.  Enlarged prostate in men. The prostate is a gland near the bladder. When it gets too big, it can pinch the urethra. With the urethra blocked, the bladder can weaken and lose the ability to empty properly.  Surgery.  Emotional factors, such as anxiety, stress, or post-traumatic stress disorder (PTSD).  Pelvic organ prolapse. This happens in women when organs shift out of place and into the vagina. This shift can prevent the bladder and urethra from working properly. What increases the risk? The following factors may make you more likely to develop this condition:  Older age.  Obesity and physical inactivity.  Pregnancy and childbirth.  Menopause.  Diseases that affect the nerves or spinal cord (neurological  diseases).  Long-term (chronic) coughing. This can increase pressure on the bladder and pelvic floor muscles. What are the signs or symptoms? Symptoms may vary depending on the type of urinary incontinence you have. They include:  A sudden urge to urinate, but passing urine involuntarily before you can get to a bathroom (urge incontinence).  Suddenly passing urine with any activity that forces urine to pass, such as coughing, laughing, exercise, or sneezing (stress incontinence).  Needing to urinate often, but urinating only a small amount, or constantly dribbling urine (overflow incontinence).  Urinating because you cannot get to the bathroom in time due to a physical disability, such as arthritis or injury, or communication and thinking problems, such as Alzheimer disease (functional incontinence). How is this diagnosed? This condition may be diagnosed based on:  Your medical history.  A physical exam.  Tests, such as: ? Urine tests. ? X-rays of your kidney and bladder. ? Ultrasound. ? CT scan. ? Cystoscopy. In this procedure, a health care provider inserts a tube with a light and camera (cystoscope) through the urethra and into the bladder in order to check for problems. ? Urodynamic testing. These tests assess how well the bladder, urethra, and sphincter can store and release urine. There are different types of urodynamic tests, and they vary depending on what the test is measuring. To help diagnose your condition, your health care provider may recommend that you keep a log of when you urinate and how much you urinate. How is this treated? Treatment for this condition depends on the  type of incontinence that you have and its cause. Treatment may include:  Lifestyle changes, such as: ? Quitting smoking. ? Maintaining a healthy weight. ? Staying active. Try to get 150 minutes of moderate-intensity exercise every week. Ask your health care provider which activities are safe for  you. ? Eating a healthy diet.  Avoid high-fat foods, like fried foods.  Avoid refined carbohydrates like white bread and white rice.  Limit how much alcohol and caffeine you drink.  Increase your fiber intake. Foods such as fresh fruits, vegetables, beans, and whole grains are healthy sources of fiber.  Pelvic floor muscle exercises.  Bladder training, such as lengthening the amount of time between bathroom breaks, or using the bathroom at regular intervals.  Using techniques to suppress bladder urges. This can include distraction techniques or controlled breathing exercises.  Medicines to relax the bladder muscles and prevent bladder spasms.  Medicines to help slow or prevent the growth of a man's prostate.  Botox injections. These can help relax the bladder muscles.  Using pulses of electricity to help change bladder reflexes (electrical nerve stimulation).  For women, using a medical device to prevent urine leaks. This is a small, tampon-like, disposable device that is inserted into the urethra.  Injecting collagen or carbon beads (bulking agents) into the urinary sphincter. These can help thicken tissue and close the bladder opening.  Surgery. Follow these instructions at home: Lifestyle  Limit alcohol and caffeine. These can fill your bladder quickly and irritate it.  Keep yourself clean to help prevent odors and skin damage. Ask your doctor about special skin creams and cleansers that can protect the skin from urine.  Consider wearing pads or adult diapers. Make sure to change them regularly, and always change them right after experiencing incontinence. General instructions  Take over-the-counter and prescription medicines only as told by your health care provider.  Use the bathroom about every 3-4 hours, even if you do not feel the need to urinate. Try to empty your bladder completely every time. After urinating, wait a minute. Then try to urinate again.  Make sure  you are in a relaxed position while urinating.  If your incontinence is caused by nerve problems, keep a log of the medicines you take and the times you go to the bathroom.  Keep all follow-up visits as told by your health care provider. This is important. Contact a health care provider if:  You have pain that gets worse.  Your incontinence gets worse. Get help right away if:  You have a fever or chills.  You are unable to urinate.  You have redness in your groin area or down your legs. Summary  Urinary incontinence refers to a condition in which a person is unable to control where and when to pass urine.  This condition may be caused by medicines, infection, weak bladder muscles, weak pelvic floor muscles, enlargement of the prostate (in men), or surgery.  The following factors increase your risk for developing this condition: older age, obesity, pregnancy and childbirth, menopause, neurological diseases, and chronic coughing.  There are several types of urinary incontinence. They include urge incontinence, stress incontinence, overflow incontinence, and functional incontinence.  This condition is usually treated first with lifestyle and behavioral changes, such as quitting smoking, eating a healthier diet, and doing regular pelvic floor exercises. Other treatment options include medicines, bulking agents, medical devices, electrical nerve stimulation, or surgery. This information is not intended to replace advice given to you by your health care  provider. Make sure you discuss any questions you have with your health care provider. Document Released: 07/14/2004 Document Revised: 06/16/2017 Document Reviewed: 09/15/2016 Elsevier Patient Education  [place].  Cough, Adult Coughing is a reflex that clears your throat and your airways (respiratory system). Coughing helps to heal and protect your lungs. It is normal to cough occasionally, but a cough that happens with other  symptoms or lasts a long time may be a sign of a condition that needs treatment. An acute cough may only last 2-3 weeks, while a chronic cough may last 8 or more weeks. Coughing is commonly caused by:  Infection of the respiratory systemby viruses or bacteria.  Breathing in substances that irritate your lungs.  Allergies.  Asthma.  Mucus that runs down the back of your throat (postnasal drip).  Smoking.  Acid backing up from the stomach into the esophagus (gastroesophageal reflux).  Certain medicines.  Chronic lung problems.  Other medical conditions such as heart failure or a blood clot in the lung (pulmonary embolism). Follow these instructions at home: Medicines  Take over-the-counter and prescription medicines only as told by your health care provider.  Talk with your health care provider before you take a cough suppressant medicine. Lifestyle   Avoid cigarette smoke. Do not use any products that contain nicotine or tobacco, such as cigarettes, e-cigarettes, and chewing tobacco. If you need help quitting, ask your health care provider.  Drink enough fluid to keep your urine pale yellow.  Avoid caffeine.  Do not drink alcohol if your health care provider tells you not to drink. General instructions   Pay close attention to changes in your cough. Tell your health care provider about them.  Always cover your mouth when you cough.  Avoid things that make you cough, such as perfume, candles, cleaning products, or campfire or tobacco smoke.  If the air is dry, use a cool mist vaporizer or humidifier in your bedroom or your home to help loosen secretions.  If your cough is worse at night, try to sleep in a semi-upright position.  Rest as needed.  Keep all follow-up visits as told by your health care provider. This is important. Contact a health care provider if you:  Have new symptoms.  Cough up pus.  Have a cough that does not get better after 2-3 weeks or  gets worse.  Cannot control your cough with cough suppressant medicines and you are losing sleep.  Have pain that gets worse or pain that is not helped with medicine.  Have a fever.  Have unexplained weight loss.  Have night sweats. Get help right away if:  You cough up blood.  You have difficulty breathing.  Your heartbeat is very fast. These symptoms may represent a serious problem that is an emergency. Do not wait to see if the symptoms will go away. Get medical help right away. Call your local emergency services (911 in the [place] Do not drive yourself to the hospital. Summary  Coughing is a reflex that clears your throat and your airways. It is normal to cough occasionally, but a cough that happens with other symptoms or lasts a long time may be a sign of a condition that needs treatment.  Take over-the-counter and prescription medicines only as told by your health care provider.  Always cover your mouth when you cough.  Contact a health care provider if you have new symptoms or a cough that does not get better after 2-3 weeks or gets worse.  This information is not intended to replace advice given to you by your health care provider. Make sure you discuss any questions you have with your health care provider. Document Released: 12/03/2010 Document Revised: 06/25/2018 Document Reviewed: 06/25/2018 Elsevier Patient Education  2020 [employer] for Gastroesophageal Reflux Disease, Adult When you have gastroesophageal reflux disease (GERD), the foods you eat and your eating habits are very important. Choosing the right foods can help ease your discomfort. Think about working with a nutrition specialist (dietitian) to help you make good choices. What are tips for following this plan?  Meals  Choose healthy foods that are low in fat, such as fruits, vegetables, whole grains, low-fat dairy products, and lean meat, fish, and poultry.  Eat small meals often  instead of 3 large meals a day. Eat your meals slowly, and in a place where you are relaxed. Avoid bending over or lying down until 2-3 hours after eating.  Avoid eating meals 2-3 hours before bed.  Avoid drinking a lot of liquid with meals.  Cook foods using methods other than frying. Bake, grill, or broil food instead.  Avoid or limit: ? Chocolate. ? Peppermint or spearmint. ? Alcohol. ? Pepper. ? Black and decaffeinated coffee. ? Black and decaffeinated tea. ? Bubbly (carbonated) soft drinks. ? Caffeinated energy drinks and soft drinks.  Limit high-fat foods such as: ? Fatty meat or fried foods. ? Whole milk, cream, butter, or ice cream. ? Nuts and nut butters. ? Pastries, donuts, and sweets made with butter or shortening.  Avoid foods that cause symptoms. These foods may be different for everyone. Common foods that cause symptoms include: ? Tomatoes. ? Oranges, lemons, and limes. ? Peppers. ? Spicy food. ? Onions and garlic. ? Vinegar. Lifestyle  Maintain a healthy weight. Ask your doctor what weight is healthy for you. If you need to lose weight, work with your doctor to do so safely.  Exercise for at least 30 minutes for 5 or more days each week, or as told by your doctor.  Wear loose-fitting clothes.  Do not smoke. If you need help quitting, ask your doctor.  Sleep with the head of your bed higher than your feet. Use a wedge under the mattress or blocks under the bed frame to raise the head of the bed. Summary  When you have gastroesophageal reflux disease (GERD), food and lifestyle choices are very important in easing your symptoms.  Eat small meals often instead of 3 large meals a day. Eat your meals slowly, and in a place where you are relaxed.  Limit high-fat foods such as fatty meat or fried foods.  Avoid bending over or lying down until 2-3 hours after eating.  Avoid peppermint and spearmint, caffeine, alcohol, and chocolate. This information is not  intended to replace advice given to you by your health care provider. Make sure you discuss any questions you have with your health care provider. Document Released: 12/06/2011 Document Revised: 09/27/2018 Document Reviewed: 07/12/2016 Elsevier Patient Education  [employer].       If you have lab work done today you will be contacted with your lab results within the next 2 weeks.  If you have not heard from [place] then please contact us. The fastest way to get your results is to register for My Chart.   IF you received an x-ray today, you will receive an invoice from [facility]. Please contact [facility] at [phone] with questions or concerns regarding  your invoice.   IF you received labwork today, you will receive an invoice from [facility]. Please contact [facility] at [phone] with questions or concerns regarding your invoice.   Our billing staff will not be able to assist you with questions regarding bills from these companies.  You will be contacted with the lab results as soon as they are available. The fastest way to get your results is to activate your My Chart account. Instructions are located on the last page of this paperwork. If you have not heard from [place] regarding the results in 2 weeks, please contact this office.    ]\

## 2019-03-16 ENCOUNTER — Other Ambulatory Visit: Payer: Self-pay | Admitting: Family Medicine

## 2019-03-16 DIAGNOSIS — R058 Other specified cough: Secondary | ICD-10-CM

## 2019-03-16 DIAGNOSIS — R059 Cough, unspecified: Secondary | ICD-10-CM

## 2019-03-16 DIAGNOSIS — R05 Cough: Secondary | ICD-10-CM

## 2019-03-17 ENCOUNTER — Encounter: Payer: Self-pay | Admitting: Family Medicine

## 2019-03-25 ENCOUNTER — Other Ambulatory Visit: Payer: Self-pay

## 2019-03-25 ENCOUNTER — Ambulatory Visit (INDEPENDENT_AMBULATORY_CARE_PROVIDER_SITE_OTHER): Payer: BC Managed Care – PPO | Admitting: Family Medicine

## 2019-03-25 ENCOUNTER — Encounter: Payer: Self-pay | Admitting: Family Medicine

## 2019-03-25 ENCOUNTER — Ambulatory Visit (INDEPENDENT_AMBULATORY_CARE_PROVIDER_SITE_OTHER): Payer: BC Managed Care – PPO

## 2019-03-25 VITALS — BP 138/86 | HR 80 | Temp 99.1°F | Resp 14 | Wt 247.4 lb

## 2019-03-25 DIAGNOSIS — R059 Cough, unspecified: Secondary | ICD-10-CM

## 2019-03-25 DIAGNOSIS — R350 Frequency of micturition: Secondary | ICD-10-CM

## 2019-03-25 DIAGNOSIS — R7303 Prediabetes: Secondary | ICD-10-CM

## 2019-03-25 DIAGNOSIS — R55 Syncope and collapse: Secondary | ICD-10-CM | POA: Diagnosis not present

## 2019-03-25 DIAGNOSIS — N393 Stress incontinence (female) (male): Secondary | ICD-10-CM

## 2019-03-25 DIAGNOSIS — R351 Nocturia: Secondary | ICD-10-CM | POA: Diagnosis not present

## 2019-03-25 DIAGNOSIS — R05 Cough: Secondary | ICD-10-CM

## 2019-03-25 DIAGNOSIS — R631 Polydipsia: Secondary | ICD-10-CM

## 2019-03-25 DIAGNOSIS — R058 Other specified cough: Secondary | ICD-10-CM

## 2019-03-25 DIAGNOSIS — R054 Cough syncope: Secondary | ICD-10-CM

## 2019-03-25 LAB — POCT URINALYSIS DIP (MANUAL ENTRY)
Bilirubin, UA: NEGATIVE
Blood, UA: NEGATIVE
Glucose, UA: NEGATIVE mg/dL
Ketones, POC UA: NEGATIVE mg/dL
Leukocytes, UA: NEGATIVE
Nitrite, UA: NEGATIVE
Spec Grav, UA: 1.025 (ref 1.010–1.025)
Urobilinogen, UA: 0.2 E.U./dL
pH, UA: 6 (ref 5.0–8.0)

## 2019-03-25 LAB — GLUCOSE, POCT (MANUAL RESULT ENTRY): POC Glucose: 109 mg/dl — AB (ref 70–99)

## 2019-03-25 MED ORDER — AZITHROMYCIN 250 MG PO TABS: ORAL_TABLET | ORAL | 0 refills | Status: DC
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
## 2019-03-25 NOTE — Patient Instructions (Addendum)
X-ray and urine test looked okay today.  Try azithromycin antibiotic to cover for one type of infection and can cause recurrent cough.  I will also refer you to pulmonology.  Continue omeprazole once per day, Flonase every day for now.  If any return of syncopal/passing out symptoms, call 911 or be seen in the emergency room.    Return to the clinic or go to the nearest emergency room if any of your symptoms worsen or new symptoms occur.   Syncope  Syncope refers to a condition in which a person temporarily loses consciousness. Syncope may also be called fainting or passing out. It is caused by a sudden decrease in blood flow to the brain. Even though most causes of syncope are not dangerous, syncope can be a sign of a serious medical problem. Your health care provider may do tests to find the reason why you are having syncope. Signs that you may be about to faint include:  Feeling dizzy or light-headed.  Feeling nauseous.  Seeing all white or all black in your field of vision.  Having cold, clammy skin. If you faint, get medical help right away. Call your local emergency services (911 in the [place] Do not drive yourself to the hospital. Follow these instructions at home: Pay attention to any changes in your symptoms. Take these actions to stay safe and to help relieve your symptoms: Lifestyle  Do not drive, use machinery, or play sports until your health care provider says it is okay.  Do not drink alcohol.  Do not use any products that contain nicotine or tobacco, such as cigarettes and e-cigarettes. If you need help quitting, ask your health care provider.  Drink enough fluid to keep your urine pale yellow. General instructions  Take over-the-counter and prescription medicines only as told by your health care provider.  If you are taking blood pressure or heart medicine, get up slowly and take several minutes to sit and then stand. This can reduce dizziness or  light-headedness.  Have someone stay with you until you feel stable.  If you start to feel like you might faint, lie down right away and raise (elevate) your feet above the level of your heart. Breathe deeply and steadily. Wait until all the symptoms have passed.  Keep all follow-up visits as told by your health care provider. This is important. Get help right away if you:  Have a severe headache.  Faint once or repeatedly.  Have pain in your chest, abdomen, or back.  Have a very fast or irregular heartbeat (palpitations).  Have pain when you breathe.  Are bleeding from your mouth or rectum, or you have black or tarry stool.  Have a seizure.  Are confused.  Have trouble walking.  Have severe weakness.  Have vision problems. These symptoms may represent a serious problem that is an emergency. Do not wait to see if your symptoms will go away. Get medical help right away. Call your local emergency services (911 in the [place] Do not drive yourself to the hospital. Summary  Syncope refers to a condition in which a person temporarily loses consciousness. It is caused by a sudden decrease in blood flow to the brain.  Signs that you may be about to faint include dizziness, feeling light-headed, feeling nauseous, sudden vision changes, or cold, clammy skin.  Although most causes of syncope are not dangerous, syncope can be a sign of a serious medical problem. If you faint, get medical help right away. This information  is not intended to replace advice given to you by your health care provider. Make sure you discuss any questions you have with your health care provider. Document Released: 06/06/2005 Document Revised: 05/19/2017 Document Reviewed: 05/15/2017 Elsevier Patient Education  [employer].   If you have lab work done today you will be contacted with your lab results within the next 2 weeks.  If you have not heard from [place] then please contact us. The fastest way to get  your results is to register for My Chart.   IF you received an x-ray today, you will receive an invoice from [facility]. Please contact [facility] at [phone] with questions or concerns regarding your invoice.   IF you received labwork today, you will receive an invoice from [facility]. Please contact [facility] at [phone] with questions or concerns regarding your invoice.   Our billing staff will not be able to assist you with questions regarding bills from these companies.  You will be contacted with the lab results as soon as they are available. The fastest way to get your results is to activate your My Chart account. Instructions are located on the last page of this paperwork. If you have not heard from [place] regarding the results in 2 weeks, please contact this office.

## 2019-03-26 ENCOUNTER — Encounter: Payer: Self-pay | Admitting: Family Medicine

## 2019-03-26 LAB — BASIC METABOLIC PANEL
BUN/Creatinine Ratio: 16 (ref 9–23)
BUN: 9 mg/dL (ref 6–24)
CO2: 29 mmol/L (ref 20–29)
Calcium: 8.9 mg/dL (ref 8.7–10.2)
Chloride: 99 mmol/L (ref 96–106)
Creatinine, Ser: 0.57 mg/dL (ref 0.57–1.00)
GFR calc Af Amer: 129 mL/min/{1.73_m2} (ref >59)
GFR calc non Af Amer: 112 mL/min/{1.73_m2} (ref >59)
Glucose: 103 mg/dL — ABNORMAL HIGH (ref 65–99)
Potassium: 4.1 mmol/L (ref 3.5–5.2)
Sodium: 140 mmol/L (ref 134–144)

## 2019-03-26 LAB — CBC
Hematocrit: 40 % (ref 34.0–46.6)
Hemoglobin: 12.6 g/dL (ref 11.1–15.9)
MCH: 23.9 pg — ABNORMAL LOW (ref 26.6–33.0)
MCHC: 31.5 g/dL (ref 31.5–35.7)
MCV: 76 fL — ABNORMAL LOW (ref 79–97)
Platelets: 356 10*3/uL (ref 150–450)
RBC: 5.27 x10E6/uL (ref 3.77–5.28)
RDW: 15.2 % (ref 11.7–15.4)
WBC: 6.5 10*3/uL (ref 3.4–10.8)

## 2019-03-26 LAB — HEMOGLOBIN A1C
Est. average glucose Bld gHb Est-mCnc: 140 mg/dL
Hgb A1c MFr Bld: 6.5 % — ABNORMAL HIGH (ref 4.8–5.6)

## 2019-03-27 MED ORDER — BENZONATATE 100 MG PO CAPS: 100.0000 mg | ORAL_CAPSULE | Freq: Three times a day (TID) | ORAL | 0 refills | Status: DC | PRN

## 2019-03-29 ENCOUNTER — Encounter: Payer: Self-pay | Admitting: Family Medicine

## 2019-03-31 ENCOUNTER — Other Ambulatory Visit: Payer: Self-pay | Admitting: Family Medicine

## 2019-03-31 DIAGNOSIS — R058 Other specified cough: Secondary | ICD-10-CM

## 2019-03-31 DIAGNOSIS — R059 Cough, unspecified: Secondary | ICD-10-CM

## 2019-03-31 DIAGNOSIS — R05 Cough: Secondary | ICD-10-CM

## 2019-04-01 NOTE — Telephone Encounter (Signed)
Requested medication (s) are due for refill today: yes  Requested medication (s) are on the active medication list: yes  Last refill:  03/16/2019  Future visit scheduled: no  Notes to clinic:  Patient requesting 90 day supply   Requested Prescriptions  Pending Prescriptions Disp Refills   omeprazole (PRILOSEC) 20 MG capsule [Pharmacy Med Name: OMEPRAZOLE DR 20 MG CAPSULE] 90 capsule 1    Sig: TAKE 1 [place]     Gastroenterology: Proton Pump Inhibitors Passed - 03/31/2019  1:29 PM      Passed - Valid encounter within last 12 months    Recent Outpatient Visits          1 week ago Cough   [facility], Ranell Patrick, MD   1 month ago Cough   [facility], Ranell Patrick, MD   1 year ago Heat sensitivity   [facility], Audrie Lia, PA-C   1 year ago Cough   [facility] [place], PA-C   2 years ago Multiple allergies   [facility], Audrie Lia, PA-C              fluticasone (FLONASE) 50 MCG/ACT nasal spray [Pharmacy Med Name: FLUTICASONE PROP 50 MCG SPRAY] 48 mL 1    Sig: PLACE 1 SPRAY INTO BOTH NOSTRILS DAILY.     Ear, Nose, and Throat: Nasal Preparations - Corticosteroids Passed - 03/31/2019  1:29 PM      Passed - Valid encounter within last 12 months    Recent Outpatient Visits          1 week ago Cough   [facility], Ranell Patrick, MD   1 month ago Cough   [facility], Ranell Patrick, MD   1 year ago Heat sensitivity   [facility] [place], PA-C   1 year ago Cough   [facility] [place], PA-C   2 years ago Multiple allergies   [facility] [place], PA-C

## 2019-04-02 NOTE — Telephone Encounter (Signed)
Please see meds - I sent refill of tessalon on 10/7.  Was another med bing requested or was that not received by pharmacy?

## 2019-04-02 NOTE — Telephone Encounter (Signed)
Hi Dr. Carlota Raspberry,   Ms. Puebla is following up on her request for cough medicine.  Please let me know if I can do anything to help.   Thank you,  Wilfred Curtis

## 2019-06-21 DIAGNOSIS — G4733 Obstructive sleep apnea (adult) (pediatric): Secondary | ICD-10-CM

## 2019-06-21 HISTORY — DX: Obstructive sleep apnea (adult) (pediatric): G47.33

## 2019-06-25 ENCOUNTER — Other Ambulatory Visit: Payer: Self-pay | Admitting: Family Medicine

## 2019-06-25 DIAGNOSIS — R058 Other specified cough: Secondary | ICD-10-CM

## 2019-06-25 DIAGNOSIS — R05 Cough: Secondary | ICD-10-CM

## 2019-07-01 ENCOUNTER — Institutional Professional Consult (permissible substitution): Payer: BLUE CROSS/BLUE SHIELD | Admitting: Pulmonary Disease

## 2019-07-18 ENCOUNTER — Institutional Professional Consult (permissible substitution): Payer: BC Managed Care – PPO | Admitting: Internal Medicine

## 2019-07-19 ENCOUNTER — Encounter: Payer: Self-pay | Admitting: Internal Medicine

## 2019-07-19 ENCOUNTER — Other Ambulatory Visit: Payer: Self-pay

## 2019-07-19 ENCOUNTER — Ambulatory Visit (INDEPENDENT_AMBULATORY_CARE_PROVIDER_SITE_OTHER): Payer: BC Managed Care – PPO | Admitting: Internal Medicine

## 2019-07-19 VITALS — BP 130/84 | HR 80 | Temp 98.4°F | Ht 67.0 in | Wt 242.6 lb

## 2019-07-19 DIAGNOSIS — G4733 Obstructive sleep apnea (adult) (pediatric): Secondary | ICD-10-CM | POA: Diagnosis not present

## 2019-07-19 DIAGNOSIS — R059 Cough, unspecified: Secondary | ICD-10-CM

## 2019-07-19 DIAGNOSIS — R05 Cough: Secondary | ICD-10-CM | POA: Diagnosis not present

## 2019-07-19 DIAGNOSIS — R058 Other specified cough: Secondary | ICD-10-CM

## 2019-07-19 DIAGNOSIS — R053 Chronic cough: Secondary | ICD-10-CM

## 2019-07-19 MED ORDER — FLUTICASONE PROPIONATE 50 MCG/ACT NA SUSP: 1.0000 | Freq: Every day | NASAL | 5 refills | Status: DC

## 2019-07-19 MED ORDER — MONTELUKAST SODIUM 10 MG PO TABS: 10.0000 mg | ORAL_TABLET | Freq: Every day | ORAL | 11 refills | Status: DC

## 2019-07-19 NOTE — Progress Notes (Signed)
Audrey Avery    [id number]    08/17/1970  Primary Care Physician:Patient, No Pcp Per  Referring Physician: Wendie Agreste, MD [address] [place],  [place] [address] Reason for Consultation: Recurrent cough 10 months,  with cough syncope recently. Date of Consultation: 07/19/2019  Chief complaint:   Chief Complaint  Patient presents with  . Consult    Had a cold back in December 2019. Developed a dry cough. She has been having "cough drop" episodes while she will pass out after coughing. Unable to control bodily functions with cough spells.      HPI:  Persistent cough for the past year after a URI in Dec 2019.   She has stress incontinence with coughing and loses control of her bladder.  She wakes up on the floor after passing out with coughing.  Prior to her URI she was having occasional stress incontinence.  Cough is dry. She coughs 10 times/day. Cough is worse in the evenings. Cough does wake her up at night from sleep.   Husband notes witnessed apnea in her sleep.  She is exhausted during the day, falls asleep talking to people.   Wheezes with forced coughing.  Feels shortness of breath at rest and even with talking.  She has nasal congestion - chronic. Had been prescribed  Gets URIs once/year.   She is up 6-7 times/night to urinate. Drinks lots of liquids before bed.   No reflux or heart burn. No environmental allergies.    OBSTRUCTIVE SLEEP APNEA SCREENING  1.  Snoring?:  yes 2.  Tired?:  yes 3.  Observed apnea, stop breathing or choking/gasping during sleep?:  yes 4.  Pressure. HTN history?  yes 5.  BMI more than 35 kg/m2?  yes 6.  Age more than 50 yrs?  no 7.  Neck size larger than 17 in for female or 16 in for female?  yes 8.  Gender = Female?  no  Total:  5  For general population  OSA - Low Risk : Yes to 0 - 2 questions OSA - Intermediate Risk : Yes to 3 - 4 questions OSA - High Risk : Yes to 5 - 8 questions  or Yes to 2 or more of 4  STOP questions + female gender or Yes to 2 or more of 4 STOP questions + BMI > 35kg/m2  or Yes to 2 or more of 4 STOP questions + neck circumference 17 inches / 43cm in female or 16 inches / 41cm in female  References: Rinaldo Cloud al. Anesthesiology 2008; 108: 812-821,  Gabriel Cirri et al Br Dara Hoyer 2012; 108: [id number],  Gabriel Cirri et al J Clin Sleep Med Sept 2014.  Social history:  Occupation: she works Scientist, research (medical) as a Scientist, water quality.  Smoking history: never smoker. Husband smokes heavily, it affects her breathing and her eyes burning.   Social History   Occupational History  . Not on file  Tobacco Use  . Smoking status: Never Smoker  . Smokeless tobacco: Never Used  Substance and Sexual Activity  . Alcohol use: No  . Drug use: No  . Sexual activity: Not on file    Relevant family history:  Family History  Problem Relation Age of Onset  . Cancer Mother   . Hypertension Mother   . Diabetes Father   . Cancer Father   . Hypertension Father   . Cancer Sister   . Hypertension Sister  Past Medical History:  Diagnosis Date  . Anemia   . Hypertension     Past Surgical History:  Procedure Laterality Date  . ABDOMINAL HYSTERECTOMY    . BREAST SURGERY    . CESAREAN SECTION    . CHOLECYSTECTOMY       Review of systems: Review of Systems  Constitutional: Positive for malaise/fatigue. Negative for chills, fever and weight loss.  HENT: Negative for congestion, sinus pain and sore throat.   Eyes: Negative for discharge and redness.  Respiratory: Negative for cough, hemoptysis, sputum production, shortness of breath and wheezing.   Cardiovascular: Negative for chest pain, palpitations and leg swelling.  Gastrointestinal: Negative for heartburn, nausea and vomiting.  Musculoskeletal: Negative for joint pain and myalgias.  Skin: Negative for rash.  Neurological: Negative for dizziness, tremors, focal weakness and headaches.  Endo/Heme/Allergies: Negative for environmental allergies.    Psychiatric/Behavioral: Negative for depression. The patient is not nervous/anxious.   All other systems reviewed and are negative.   Physical Exam: Blood pressure 130/84, pulse 80, temperature 98.4 F (36.9 C), temperature source Temporal, height 5\' 7"  (1.702 m), weight 242 lb 9.6 oz (110 kg), SpO2 96 %. Gen:      No acute distress, obese Eyes: EOMI, sclera anicteric ENT:  Mallampati IV, enlarged tonsils, cobblestoning in oropharynx Neck:     Supple, no thyromegaly Lungs:    No increased respiratory effort, symmetric chest wall excursion, clear to auscultation bilaterally, no wheezes or crackles CV:         Regular rate and rhythm; no murmurs, rubs, or gallops.  No pedal edema Abd:      + bowel sounds; soft, non-tender; no distension MSK: no acute synovitis of DIP or PIP joints, no mechanics hands.  Skin:      Warm and dry; no rashes Neuro: normal speech, no focal facial asymmetry Psych: alert and oriented x3, normal mood and affect  Data Reviewed: Imaging: I have personally reviewed the chest xray 03/2019 without any acute cardiopulmonary process.  Assessment:  Chronic Cough OSA with excessive daytime sleepiness  Plan/Recommendations: Chronic cough likely secondary to rhinitis and an upper airway cough syndrome with postnasal drip.  We will start Flonase.  She was on this before and had some benefit.  Resume once a day.  We will also start Singulair to help with rhinitis.  She has ongoing passive tobacco which is likely contributing.  She is very high risk for sleep apnea.  She needs a sleep study soon as possible.  We will see her back once we can get her on CPAP.  She has stress incontinence which existed prior to her coughing episodes.  She has blacked out before with her coughing, likely has been to the severity of her cough.  There are no constitutional symptoms or known pertussis exposures.  I spent 45 minutes on 07/19/2019 in care of this patient including face to face time  and non-face to face time spent charting, review of outside records, and coordination of care.   Return to Care: Return in about 3 months (around 10/17/2019).  Lenice Llamas, MD Pulmonary and [place]  CC: Wendie Agreste, MD

## 2019-07-19 NOTE — Patient Instructions (Addendum)
The patient should have follow up scheduled with myself in 3 months.  Needs sleep study.  Start taking singulair once a day.    Flonase - 1 spray on each side of your nose twice a day for first week, then 1 spray on each side.   Instructions for use:  If you also use a saline nasal spray or rinse, use that first.  Position the head with the chin slightly tucked. Use the right hand to spray into the left nostril and the right hand to spray into the left nostril.   Point the bottle away from the septum of your nose (cartilage that divides the two sides of your nose).   Hold the nostril closed on the opposite side from where you will spray  Spray once and gently sniff to pull the medicine into the higher parts of your nose.  Don't sniff too hard as the medicine will drain down the back of your throat instead.  Repeat with a second spray on the same side if prescribed.  Repeat on the other side of your nose.

## 2019-07-22 ENCOUNTER — Telehealth: Payer: Self-pay | Admitting: Internal Medicine

## 2019-07-22 NOTE — Telephone Encounter (Signed)
Spoke with pt, aware of recs.  Nothing further needed at this time- will close encounter.   

## 2019-07-22 NOTE — Telephone Encounter (Signed)
Would recommend continue taking flonase and Singulair. Nasal spray can loosen mucus and make symptoms more obvious. Would give it a good 2 week trial.

## 2019-07-22 NOTE — Telephone Encounter (Signed)
Spoke with patient, was seen by Dr. Shearon Stalls on Friday and prescribed flonase and singulair.  Pt started taking both meds Friday night, woke up Saturday with a headache, stuffy nose, pnd, producing clear mucus through sinuses.  Denies fever, chest pain, loss of taste/smell.  Pt has not been taking any meds to help with s/s.  Did not start any new meds aside from these two, only takes amlodipine besides these meds.   Requesting recs.  Pt took flonase this morning, has not taken singulair today.    Sending to APP since ND is not here today.  Beth please advise on recs.  Thanks!   Pharmacy: [facility]

## 2019-07-29 ENCOUNTER — Other Ambulatory Visit (HOSPITAL_COMMUNITY)
Admission: RE | Admit: 2019-07-29 | Discharge: 2019-07-29 | Disposition: A | Discharge status: Home / Self Care | Payer: BC Managed Care – PPO | Source: Ambulatory Visit | Attending: Pulmonary Disease | Admitting: Pulmonary Disease

## 2019-07-29 DIAGNOSIS — Z01812 Encounter for preprocedural laboratory examination: Secondary | ICD-10-CM | POA: Diagnosis not present

## 2019-07-29 DIAGNOSIS — Z20822 Contact with and (suspected) exposure to covid-19: Secondary | ICD-10-CM | POA: Diagnosis not present

## 2019-07-29 LAB — SARS CORONAVIRUS 2 (TAT 6-24 HRS): SARS Coronavirus 2: NEGATIVE

## 2019-08-01 ENCOUNTER — Other Ambulatory Visit: Payer: Self-pay

## 2019-08-01 ENCOUNTER — Ambulatory Visit (HOSPITAL_BASED_OUTPATIENT_CLINIC_OR_DEPARTMENT_OTHER): Payer: BC Managed Care – PPO | Attending: Internal Medicine | Admitting: Pulmonary Disease

## 2019-08-01 DIAGNOSIS — G4733 Obstructive sleep apnea (adult) (pediatric): Secondary | ICD-10-CM | POA: Diagnosis not present

## 2019-08-01 DIAGNOSIS — G4736 Sleep related hypoventilation in conditions classified elsewhere: Secondary | ICD-10-CM | POA: Diagnosis not present

## 2019-08-01 DIAGNOSIS — R0902 Hypoxemia: Secondary | ICD-10-CM | POA: Insufficient documentation

## 2019-08-02 DIAGNOSIS — G4733 Obstructive sleep apnea (adult) (pediatric): Secondary | ICD-10-CM | POA: Insufficient documentation

## 2019-08-20 ENCOUNTER — Encounter: Payer: Self-pay | Admitting: Family Medicine

## 2019-08-21 ENCOUNTER — Telehealth: Payer: Self-pay | Admitting: Internal Medicine

## 2019-08-21 DIAGNOSIS — G4733 Obstructive sleep apnea (adult) (pediatric): Secondary | ICD-10-CM | POA: Diagnosis not present

## 2019-08-21 NOTE — Telephone Encounter (Signed)
Dr. Sood, please advise on this. 

## 2019-08-21 NOTE — Telephone Encounter (Signed)
This study was assigned to Dr Halford Chessman and he hasn't read it yet. You can ask him to read it, or I can have [facility] switch it to my pool and I will read it.

## 2019-08-21 NOTE — Telephone Encounter (Signed)
Results are in epic.

## 2019-08-21 NOTE — Telephone Encounter (Signed)
Dr. Annamaria Boots,  I found the report and printed it out for review. Can you take a look at it on Dr. Juanetta Gosling behalf?

## 2019-08-21 NOTE — Progress Notes (Signed)
Will see if our sleep doctors can help [place] with the results so we can then let pt know.

## 2019-08-21 NOTE — Telephone Encounter (Signed)
Message received from Dr. Shearon Stalls: Audrey Geralds, MD  P [facility] Triage [facility] like patient is requesting results of sleep study? I don't have them yet and am unable to close this encounter - I don't read sleep studies.   Dr. Annamaria Boots, is there any way you can help [place] out with this please.

## 2019-08-21 NOTE — Procedures (Signed)
    Patient Name: Mehr, Sale Date: 08/01/2019 Gender: Female D.O.B: 17-Nov-1970 Age (years): 66 Referring Provider: Lenice Llamas MD Height (inches): 20 Interpreting Physician: Chesley Mires MD, ABSM Weight (lbs): 242 RPSGT: Zadie Rhine BMI: 39 MRN: [id number] Neck Size: 17.00  CLINICAL INFORMATION Sleep Study Type: NPSG  Indication for sleep study: Non-refreshing Sleep, Obesity, Snoring, Witnesses Apnea / Gasping During Sleep  Epworth Sleepiness Score: 24  SLEEP STUDY TECHNIQUE As per the AASM Manual for the Scoring of Sleep and Associated Events v2.3 (April 2016) with a hypopnea requiring 4% desaturations.  The channels recorded and monitored were frontal, central and occipital EEG, electrooculogram (EOG), submentalis EMG (chin), nasal and oral airflow, thoracic and abdominal wall motion, anterior tibialis EMG, snore microphone, electrocardiogram, and pulse oximetry.  MEDICATIONS Medications self-administered by patient taken the night of the study : BENZONATATE, SINGULAIR, NORVASC, [place] The study was initiated at 9:38:35 PM and ended at 3:35:41 AM.  Sleep onset time was 8.5 minutes and the sleep efficiency was 83.9%%. The total sleep time was 299.5 minutes.  Stage REM latency was 129.0 minutes.  The patient spent 4.0%% of the night in stage N1 sleep, 79.1%% in stage N2 sleep, 0.0%% in stage N3 and 16.9% in REM.  Alpha intrusion was absent.  Supine sleep was 37.39%.  RESPIRATORY PARAMETERS The overall apnea/hypopnea index (AHI) was 127.8 per hour. There were 565 total apneas, including 551 obstructive, 3 central and 11 mixed apneas. There were 73 hypopneas and 1 RERAs.  The AHI during Stage REM sleep was 91.5 per hour.  AHI while supine was 123.8 per hour.  The mean oxygen saturation was 72.0%. The minimum SpO2 during sleep was 51.0%.  loud snoring was noted during this study.  CARDIAC DATA The 2 lead EKG demonstrated sinus  rhythm. The mean heart rate was 91.3 beats per minute. Other EKG findings include: PVCs.  LEG MOVEMENT DATA The total PLMS were 0 with a resulting PLMS index of 0.0. Associated arousal with leg movement index was 0.0 .  IMPRESSIONS - Severe obstructive sleep apnea with an AHI of 127.8 and SpO2 low of 51%.  DIAGNOSIS - Obstructive Sleep Apnea (327.23 [G47.33 ICD-10]) - Nocturnal Hypoxemia (327.26 [G47.36 ICD-10])  RECOMMENDATIONS - Given the severity of her obstructive sleep apnea and oxygen desaturation, she should be started on CPAP therapy first.  Option would be to arrange for auto CPAP with pressure setting 5 to 20 cm H2O to expedite initiation of therapy, and review CPAP download and overnight oximetry on this set up.  Could then determine whether she needs in lab titration study.  [Electronically signed] 08/21/2019 09:33 AM  Chesley Mires MD, ABSM Diplomate, American Board of Sleep Medicine   NPI: [id number]

## 2019-08-22 NOTE — Telephone Encounter (Signed)
Dr Shearon Stalls has already routed message back to Dr Annamaria Boots Will also route to [facility] to ensure patient receives follow up once Dr Annamaria Boots reads the sleep study

## 2019-08-22 NOTE — Telephone Encounter (Signed)
The study shows severe obstructive sleep apnea. The study was assigned to Dr Halford Chessman to read, but he hasn't gotten to it yet.  I have had the [facility] Center move it into my list to read. I can do the formal reading this weekend so Dr Shearon Stalls can decide how she wants to go forward. The patient has no appointment pending for f/u. I can see her and manage the OSA if desired.

## 2019-08-22 NOTE — Telephone Encounter (Signed)
That would be great if you could see her, thanks clint.

## 2019-08-26 NOTE — Telephone Encounter (Signed)
Dr Halford Chessman has already read this sleep study, although under procedure tab it is still listed as pending. I can help if needed.

## 2019-08-27 ENCOUNTER — Other Ambulatory Visit: Payer: Self-pay

## 2019-08-27 ENCOUNTER — Inpatient Hospital Stay
Admission: RE | Admit: 2019-08-27 | Discharge: 2019-08-27 | Disposition: A | Discharge status: Home / Self Care | Payer: BC Managed Care – PPO | Source: Ambulatory Visit

## 2019-09-05 ENCOUNTER — Telehealth: Payer: Self-pay | Admitting: Internal Medicine

## 2019-09-05 DIAGNOSIS — G4733 Obstructive sleep apnea (adult) (pediatric): Secondary | ICD-10-CM

## 2019-09-05 NOTE — Telephone Encounter (Signed)
Severe sleep apnea. Needs to be started on CPAP and then have follow up with me after a month of receiving machine.   Per recommendations: Auto CPAP with pressure setting 5 to 20 cm H2O to expedite initiation of therapy, and review CPAP download and overnight oximetry on this set up.  Could then determine whether she needs in lab titration study.

## 2019-09-06 ENCOUNTER — Encounter: Payer: Self-pay | Admitting: Family Medicine

## 2019-09-06 ENCOUNTER — Other Ambulatory Visit: Payer: Self-pay

## 2019-09-06 ENCOUNTER — Ambulatory Visit (INDEPENDENT_AMBULATORY_CARE_PROVIDER_SITE_OTHER): Payer: BC Managed Care – PPO | Admitting: Family Medicine

## 2019-09-06 ENCOUNTER — Ambulatory Visit (INDEPENDENT_AMBULATORY_CARE_PROVIDER_SITE_OTHER): Payer: BC Managed Care – PPO

## 2019-09-06 VITALS — BP 151/97 | HR 82 | Temp 98.4°F | Resp 15 | Ht 66.0 in | Wt 245.4 lb

## 2019-09-06 DIAGNOSIS — M7989 Other specified soft tissue disorders: Secondary | ICD-10-CM | POA: Diagnosis not present

## 2019-09-06 DIAGNOSIS — R059 Cough, unspecified: Secondary | ICD-10-CM

## 2019-09-06 DIAGNOSIS — R05 Cough: Secondary | ICD-10-CM | POA: Diagnosis not present

## 2019-09-06 DIAGNOSIS — R0600 Dyspnea, unspecified: Secondary | ICD-10-CM

## 2019-09-06 DIAGNOSIS — R7303 Prediabetes: Secondary | ICD-10-CM

## 2019-09-06 DIAGNOSIS — L52 Erythema nodosum: Secondary | ICD-10-CM | POA: Diagnosis not present

## 2019-09-06 DIAGNOSIS — G4733 Obstructive sleep apnea (adult) (pediatric): Secondary | ICD-10-CM | POA: Diagnosis not present

## 2019-09-06 DIAGNOSIS — R55 Syncope and collapse: Secondary | ICD-10-CM

## 2019-09-06 DIAGNOSIS — R0609 Other forms of dyspnea: Secondary | ICD-10-CM

## 2019-09-06 LAB — POCT CBC
Granulocyte percent: 68 %G (ref 37–80)
HCT, POC: 42.1 % — AB (ref 29–41)
Hemoglobin: 12.9 g/dL (ref 11–14.6)
Lymph, poc: 1.7 (ref 0.6–3.4)
MCH, POC: 23.8 pg — AB (ref 27–31.2)
MCHC: 30.7 g/dL — AB (ref 31.8–35.4)
MCV: 77.7 fL (ref 76–111)
MID (cbc): 0.4 (ref 0–0.9)
MPV: 8.9 fL (ref 0–99.8)
POC Granulocyte: 4.6 (ref 2–6.9)
POC LYMPH PERCENT: 25.4 %L (ref 10–50)
POC MID %: 6.6 %M (ref 0–12)
Platelet Count, POC: 283 10*3/uL (ref 142–424)
RBC: 5.41 M/uL (ref 4.04–5.48)
RDW, POC: 17.4 %
WBC: 6.7 10*3/uL (ref 4.6–10.2)

## 2019-09-06 NOTE — Progress Notes (Signed)
  Tuberculosis Risk Questionnaire  1. No Were you born outside the [place] in one of the following parts of the world: [place], [place], [place], [place] or [place]?    2. No Have you traveled outside the [place] and lived for more than one month in one of the following parts of the world: [place], [place], [place], [place] or [place]?    3. No Do you have a compromised immune system such as from any of the following conditions:HIV/AIDS, organ or bone marrow transplantation, diabetes, immunosuppressive medicines (e.g. Prednisone, Remicaide), leukemia, lymphoma, cancer of the head or neck, gastrectomy or jejunal bypass, end-stage renal disease (on dialysis), or silicosis?     4. No Have you ever or do you plan on working in: a residential care center, a health care facility, a jail or prison or homeless shelter?    5. No Have you ever: injected illegal drugs, used crack cocaine, lived in a homeless shelter  or been in jail or prison?     6. No Have you ever been exposed to anyone with infectious tuberculosis?  7. No Have you ever had a BCG vaccine? (BCG is a vaccine for tuberculosis  (TB) used in OTHER countries, NOT in the [place]).  8. No Have you ever been advised by a health care provider NOT to have a TB skin test?  9. No Have you ever had a POSITIVE TB skin test?  IF SO, when? n/a  IF SO, were you treated with INH? n/a  IF SO, where? n/a  Tuberculosis Symptom Questionnaire  Do you currently have any of the following symptoms?  1. No Unexplained cough lasting more than 3 weeks?  Yes  2. No Unexplained fever lasting more than 3 weeks.  NO  3. No Night Sweats (sweating that leaves the bedclothes and sheets wet)  NO   4. No Shortness of Breath  NO  5. No Chest Pain  NO  6. No Unintentional weight loss   NO  7. No Unexplained fatigue (very tired for no reason)  NO

## 2019-09-06 NOTE — Progress Notes (Signed)
Subjective:  Patient ID: Audrey Avery, female    DOB: [birth year]  Age: 49 y.o. MRN: [id number]  CC:  Chief Complaint  Patient presents with  . Edema    swelling and bruising on both legs x3 weeks, denies weeping, no dietary changes, started taking montelukast last visit has noticed she gets a little "loopy" after taking at one paint asked her husband where the baby was but she does not have a baby. bruising is appearing without a known cause     HPI Audrey Avery presents for   Lower extremity edema, bruising.: Has been swelling, bruising on both legs past 3 weeks. No other bruising. More red, painful initially.  Only new medications with Singulair for allergies, not currently on diuretics.  History anemia, OSA recently diagnosed, severe with plan for CPAP - she had not received results yet. (AHI 127.8, O2 nader 51%).  No recent sore throat. No recent PPD - normal a few years ago for work. No exposure known.  No recent intestinal infection.  No cat scratches or cats at home.  No known history of cancers.  No history of sarcoidosis.     Most recent hemoglobin 12.6 in October 2020.  Platelets were normal at that time at 356. Both legs - 3 weeks.. no known change in diet, no increased salt/frozen/processed foods.  No chest pain, no dyspnea. Still some chronic cough - since December 2019.  seen by [place] [facility]. No recent cough change - was getting better, returned few months ago.   Later in visit notes she was getting ready for bed 2 nights ago- sat on edge of bed, grabbed cell phone, passed out and was falling when she woke up. Live by self - unwitnessed. Denies CP at that time, no cough, no seizure activity, no focal weakness. [place] forward and scraped scalp. Sore on scalp initially - no current headache. Prior syncope during coughing spells - few in past few months. not coughing with current incident.  Some shortness of breath walking - denies recent changes. No CP with  exertion.  No prior cardiac echo. No prior stress test.  No recent fever, new cough or congestion (chronic cough), no change in taste/smell or contact with covid 19 known.  No recent palpitations.  Denies near syncopal symptoms.  History Patient Active Problem List   Diagnosis Date Noted  . OSA (obstructive sleep apnea) 08/02/2019  . Anemia, unspecified 02/24/2013   Past Medical History:  Diagnosis Date  . Anemia   . Hypertension    Past Surgical History:  Procedure Laterality Date  . ABDOMINAL HYSTERECTOMY    . BREAST SURGERY    . CESAREAN SECTION    . CHOLECYSTECTOMY     Allergies  Allergen Reactions  . Penicillins Anaphylaxis and Swelling    Has patient had a PCN reaction causing immediate rash, facial/tongue/throat swelling, SOB or lightheadedness with hypotension: Yes Has patient had a PCN reaction causing severe rash involving mucus membranes or skin necrosis: No Has patient had a PCN reaction that required hospitalization No Has patient had a PCN reaction occurring within the last 10 years: No If all of the above answers are "NO", then may proceed with Cephalosporin use.    Prior to Admission medications   Medication Sig Start Date End Date Taking? Authorizing Provider  amLODipine (NORVASC) 10 MG tablet Take 1 tablet (10 mg total) by mouth daily. 12/28/17  Yes Tereasa Coop, PA-C  fluticasone (FLONASE) 50 MCG/ACT nasal spray Place 1 spray  into both nostrils daily. 07/19/19  Yes Spero Geralds, MD  montelukast (SINGULAIR) 10 MG tablet Take 1 tablet (10 mg total) by mouth at bedtime. 07/19/19  Yes Spero Geralds, MD   Social History   Socioeconomic History  . Marital status: Single    Spouse name: Not on file  . Number of children: Not on file  . Years of education: Not on file  . Highest education level: Not on file  Occupational History  . Not on file  Tobacco Use  . Smoking status: Never Smoker  . Smokeless tobacco: Never Used  Substance and Sexual  Activity  . Alcohol use: No  . Drug use: No  . Sexual activity: Not on file  Other Topics Concern  . Not on file  Social History Narrative  . Not on file   Social Determinants of Health   Financial Resource Strain:   . Difficulty of Paying Living Expenses:   Food Insecurity:   . Worried About Charity fundraiser in the Last Year:   . Arboriculturist in the Last Year:   Transportation Needs:   . Film/video editor (Medical):   Marland Kitchen Lack of Transportation (Non-Medical):   Physical Activity:   . Days of Exercise per Week:   . Minutes of Exercise per Session:   Stress:   . Feeling of Stress :   Social Connections:   . Frequency of Communication with Friends and Family:   . Frequency of Social Gatherings with Friends and Family:   . Attends Religious Services:   . Active Member of Clubs or Organizations:   . Attends Archivist Meetings:   Marland Kitchen Marital Status:   Intimate Partner Violence:   . Fear of Current or Ex-Partner:   . Emotionally Abused:   Marland Kitchen Physically Abused:   . Sexually Abused:     Review of Systems   Objective:   Vitals:   09/06/19 1420  BP: (!) 151/97  Pulse: 82  Resp: 15  Temp: 98.4 F (36.9 C)  TempSrc: Temporal  SpO2: 96%  Weight: 245 lb 6.4 oz (111.3 kg)  Height: 5\' 6"  (1.676 m)     Physical Exam Vitals reviewed.  Constitutional:      Appearance: She is well-developed.  HENT:     Head: Normocephalic and atraumatic.  Eyes:     Conjunctiva/sclera: Conjunctivae normal.     Pupils: Pupils are equal, round, and reactive to light.  Neck:     Vascular: No carotid bruit.     Comments: No jvd Cardiovascular:     Rate and Rhythm: Normal rate and regular rhythm.     Heart sounds: Normal heart sounds.  Pulmonary:     Effort: Pulmonary effort is normal.     Breath sounds: Normal breath sounds. No rhonchi or rales.  Abdominal:     Palpations: Abdomen is soft. There is no pulsatile mass.     Tenderness: There is no abdominal  tenderness.  Musculoskeletal:     Right lower leg: Edema (1+ to proximal to mid tibia bilaterally no ulcerations) present.     Left lower leg: Edema present.  Skin:    General: Skin is warm and dry.     Findings: Lesion (Slight elevated nodular areas on lower legs bilaterally, see photo, nontender.  Reports initially being more tender and red) present.  Neurological:     Mental Status: She is alert and oriented to person, place, and time.  Psychiatric:  Behavior: Behavior normal.        EKG: Sinus rhythm, rate 75, no acute ST/T wave changes.  Results for orders placed or performed in visit on 09/06/19  POCT CBC  Result Value Ref Range   WBC 6.7 4.6 - 10.2 K/uL   Lymph, poc 1.7 0.6 - 3.4   POC LYMPH PERCENT 25.4 10 - 50 %L   MID (cbc) 0.4 0 - 0.9   POC MID % 6.6 0 - 12 %M   POC Granulocyte 4.6 2 - 6.9   Granulocyte percent 68.0 37 - 80 %G   RBC 5.41 4.04 - 5.48 M/uL   Hemoglobin 12.9 11 - 14.6 g/dL   HCT, POC 42.1 (A) 29 - 41 %   MCV 77.7 76 - 111 fL   MCH, POC 23.8 (A) 27 - 31.2 pg   MCHC 30.7 (A) 31.8 - 35.4 g/dL   RDW, POC 17.4 %   Platelet Count, POC 283 142 - 424 K/uL   MPV 8.9 0 - 99.8 fL   DG Chest 2 View  Result Date: 09/06/2019 CLINICAL DATA:  Chronic cough EXAM: CHEST - 2 VIEW COMPARISON:  03/25/2019 FINDINGS: The heart size and mediastinal contours are within normal limits. Both lungs are clear. The visualized skeletal structures are unremarkable. IMPRESSION: No active cardiopulmonary disease. Electronically Signed   By: Constance Holster M.D.   On: 09/06/2019 15:32     Assessment & Plan:  Audrey Avery is a 49 y.o. female . Syncope, unspecified syncope type - Plan: EKG 12-Lead, Ambulatory referral to Cardiology  - brief episode as above, not coughing at time. Regained alertness as falling. Denies associated chest pain or focal weakness. No seizure activity. No near syncopal symptoms since. No concerns on EKG. CBC ok. Recent diagnosis of OSA as  above.   - ER eval discussed but asymptomatic at present. Cardiology eval, ER/911 precaution if recurrence.   Prediabetes - Plan: Microalbumin, urine   OSA (obstructive sleep apnea)  - plan for CPAP - advised to discuss with pulmonary.   Leg swelling, Erythema nodosum - Plan: Antistreptolysin O titer, TB Skin Test, Sedimentation Rate, Angiotensin converting enzyme, DG Chest 2 View  - screen for prior strep infection, sarcoid, PPD, sed rate for some possible causes of Erythema Nodosum. Further testing if needed. Recheck 1 week.  Cough - Plan: Antistreptolysin O titer, TB Skin Test, Sedimentation Rate, POCT CBC, DG Chest 2 View DOE (dyspnea on exertion) - Plan: Pro b natriuretic peptide  - longstanding cough. Continue pulmonary follow up. Refer to cardiology with dyspnea on exertion and new diagnosis of OSA.Marland Kitchen BNP obtained.   Recheck 1 week with ER precautions.   No orders of the defined types were placed in this encounter.  Patient Instructions   As we discussed I do not have a cause for your symptoms 2 nights ago.  I think it is certainly reasonable to be evaluated to the emergency room tonight.  If you decide to watch for any return of symptoms, call 911 if you feel lightheaded dizzy or feel like you are going to pass out.  I will refer you to cardiology.  I am checking some other tests for the rash on the legs.  Call your pulmonologist to discuss treatment for obstructive sleep apnea.  Recheck with me in 1 week.  Return to the clinic or go to the nearest emergency room if any of your symptoms worsen or new symptoms occur.   Syncope  Syncope refers to a  condition in which a person temporarily loses consciousness. Syncope may also be called fainting or passing out. It is caused by a sudden decrease in blood flow to the brain. Even though most causes of syncope are not dangerous, syncope can be a sign of a serious medical problem. Your health care provider may do tests to find the reason  why you are having syncope. Signs that you may be about to faint include:  Feeling dizzy or light-headed.  Feeling nauseous.  Seeing all white or all black in your field of vision.  Having cold, clammy skin. If you faint, get medical help right away. Call your local emergency services (911 in the [place] Do not drive yourself to the hospital. Follow these instructions at home: Pay attention to any changes in your symptoms. Take these actions to stay safe and to help relieve your symptoms: Lifestyle  Do not drive, use machinery, or play sports until your health care provider says it is okay.  Do not drink alcohol.  Do not use any products that contain nicotine or tobacco, such as cigarettes and e-cigarettes. If you need help quitting, ask your health care provider.  Drink enough fluid to keep your urine pale yellow. General instructions  Take over-the-counter and prescription medicines only as told by your health care provider.  If you are taking blood pressure or heart medicine, get up slowly and take several minutes to sit and then stand. This can reduce dizziness or light-headedness.  Have someone stay with you until you feel stable.  If you start to feel like you might faint, lie down right away and raise (elevate) your feet above the level of your heart. Breathe deeply and steadily. Wait until all the symptoms have passed.  Keep all follow-up visits as told by your health care provider. This is important. Get help right away if you:  Have a severe headache.  Faint once or repeatedly.  Have pain in your chest, abdomen, or back.  Have a very fast or irregular heartbeat (palpitations).  Have pain when you breathe.  Are bleeding from your mouth or rectum, or you have black or tarry stool.  Have a seizure.  Are confused.  Have trouble walking.  Have severe weakness.  Have vision problems. These symptoms may represent a serious problem that is an emergency. Do not  wait to see if your symptoms will go away. Get medical help right away. Call your local emergency services (911 in the [place] Do not drive yourself to the hospital. Summary  Syncope refers to a condition in which a person temporarily loses consciousness. It is caused by a sudden decrease in blood flow to the brain.  Signs that you may be about to faint include dizziness, feeling light-headed, feeling nauseous, sudden vision changes, or cold, clammy skin.  Although most causes of syncope are not dangerous, syncope can be a sign of a serious medical problem. If you faint, get medical help right away. This information is not intended to replace advice given to you by your health care provider. Make sure you discuss any questions you have with your health care provider. Document Revised: 05/19/2017 Document Reviewed: 05/15/2017 Elsevier Patient Education  [employer].    If you have lab work done today you will be contacted with your lab results within the next 2 weeks.  If you have not heard from [place] then please contact us. The fastest way to get your results is to register for My Chart.  IF you received an x-ray today, you will receive an invoice from [facility]. Please contact [facility] at [phone] with questions or concerns regarding your invoice.   IF you received labwork today, you will receive an invoice from [facility]. Please contact [facility] at [phone] with questions or concerns regarding your invoice.   Our billing staff will not be able to assist you with questions regarding bills from these companies.  You will be contacted with the lab results as soon as they are available. The fastest way to get your results is to activate your My Chart account. Instructions are located on the last page of this paperwork. If you have not heard from [place] regarding the results in 2 weeks, please contact this office.         Signed, Merri Ray, MD [facility]

## 2019-09-06 NOTE — Patient Instructions (Addendum)
As we discussed I do not have a cause for your symptoms 2 nights ago.  I think it is certainly reasonable to be evaluated to the emergency room tonight.  If you decide to watch for any return of symptoms, call 911 if you feel lightheaded dizzy or feel like you are going to pass out.  I will refer you to cardiology.  I am checking some other tests for the rash on the legs.  Call your pulmonologist to discuss treatment for obstructive sleep apnea.  Recheck with me in 1 week.  Return to the clinic or go to the nearest emergency room if any of your symptoms worsen or new symptoms occur.   Syncope  Syncope refers to a condition in which a person temporarily loses consciousness. Syncope may also be called fainting or passing out. It is caused by a sudden decrease in blood flow to the brain. Even though most causes of syncope are not dangerous, syncope can be a sign of a serious medical problem. Your health care provider may do tests to find the reason why you are having syncope. Signs that you may be about to faint include:  Feeling dizzy or light-headed.  Feeling nauseous.  Seeing all white or all black in your field of vision.  Having cold, clammy skin. If you faint, get medical help right away. Call your local emergency services (911 in the [place] Do not drive yourself to the hospital. Follow these instructions at home: Pay attention to any changes in your symptoms. Take these actions to stay safe and to help relieve your symptoms: Lifestyle  Do not drive, use machinery, or play sports until your health care provider says it is okay.  Do not drink alcohol.  Do not use any products that contain nicotine or tobacco, such as cigarettes and e-cigarettes. If you need help quitting, ask your health care provider.  Drink enough fluid to keep your urine pale yellow. General instructions  Take over-the-counter and prescription medicines only as told by your health care provider.  If you are  taking blood pressure or heart medicine, get up slowly and take several minutes to sit and then stand. This can reduce dizziness or light-headedness.  Have someone stay with you until you feel stable.  If you start to feel like you might faint, lie down right away and raise (elevate) your feet above the level of your heart. Breathe deeply and steadily. Wait until all the symptoms have passed.  Keep all follow-up visits as told by your health care provider. This is important. Get help right away if you:  Have a severe headache.  Faint once or repeatedly.  Have pain in your chest, abdomen, or back.  Have a very fast or irregular heartbeat (palpitations).  Have pain when you breathe.  Are bleeding from your mouth or rectum, or you have black or tarry stool.  Have a seizure.  Are confused.  Have trouble walking.  Have severe weakness.  Have vision problems. These symptoms may represent a serious problem that is an emergency. Do not wait to see if your symptoms will go away. Get medical help right away. Call your local emergency services (911 in the [place] Do not drive yourself to the hospital. Summary  Syncope refers to a condition in which a person temporarily loses consciousness. It is caused by a sudden decrease in blood flow to the brain.  Signs that you may be about to faint include dizziness, feeling light-headed, feeling nauseous, sudden vision  changes, or cold, clammy skin.  Although most causes of syncope are not dangerous, syncope can be a sign of a serious medical problem. If you faint, get medical help right away. This information is not intended to replace advice given to you by your health care provider. Make sure you discuss any questions you have with your health care provider. Document Revised: 05/19/2017 Document Reviewed: 05/15/2017 Elsevier Patient Education  [employer].    If you have lab work done today you will be contacted with your lab results  within the next 2 weeks.  If you have not heard from [place] then please contact us. The fastest way to get your results is to register for My Chart.   IF you received an x-ray today, you will receive an invoice from [facility]. Please contact [facility] at [phone] with questions or concerns regarding your invoice.   IF you received labwork today, you will receive an invoice from [facility]. Please contact [facility] at [phone] with questions or concerns regarding your invoice.   Our billing staff will not be able to assist you with questions regarding bills from these companies.  You will be contacted with the lab results as soon as they are available. The fastest way to get your results is to activate your My Chart account. Instructions are located on the last page of this paperwork. If you have not heard from [place] regarding the results in 2 weeks, please contact this office.

## 2019-09-07 ENCOUNTER — Encounter: Payer: Self-pay | Admitting: Family Medicine

## 2019-09-07 LAB — MICROALBUMIN, URINE: Microalbumin, Urine: 40.9 ug/mL

## 2019-09-07 LAB — PRO B NATRIURETIC PEPTIDE: NT-Pro BNP: 47 pg/mL (ref 0–249)

## 2019-09-07 LAB — SEDIMENTATION RATE: Sed Rate: 87 mm/hr — ABNORMAL HIGH (ref 0–32)

## 2019-09-07 LAB — ANTISTREPTOLYSIN O TITER: ASO: 954 IU/mL — ABNORMAL HIGH (ref 0.0–200.0)

## 2019-09-07 LAB — ANGIOTENSIN CONVERTING ENZYME: Angio Convert Enzyme: 24 U/L (ref 14–82)

## 2019-09-09 ENCOUNTER — Other Ambulatory Visit: Payer: Self-pay

## 2019-09-09 ENCOUNTER — Ambulatory Visit (INDEPENDENT_AMBULATORY_CARE_PROVIDER_SITE_OTHER): Payer: BC Managed Care – PPO | Admitting: Family Medicine

## 2019-09-09 DIAGNOSIS — R7611 Nonspecific reaction to tuberculin skin test without active tuberculosis: Secondary | ICD-10-CM

## 2019-09-09 LAB — TB SKIN TEST
Induration: 13 mm
TB Skin Test: POSITIVE

## 2019-09-09 NOTE — Progress Notes (Signed)
Dr. Carlota Raspberry was able to assess the red, raised induration on pt's left arm. Wants pt to follow up with the health dept. Pt understands that she is awaiting an appt for follow up

## 2019-09-09 NOTE — Telephone Encounter (Signed)
Patient got her sleep study result's and cpap was already ordered.Nothing else further needed.

## 2019-09-09 NOTE — Progress Notes (Signed)
Asked to evaluate possible positive ppd reading.   74mm erythema, 77mm induration.   Denies new cough, hemoptysis, night sweats, fever, unexplained weight loss. CXR form last visit reassuring.   Will refer to health department.    DG Chest 2 View  Result Date: 09/06/2019 CLINICAL DATA:  Chronic cough EXAM: CHEST - 2 VIEW COMPARISON:  03/25/2019 FINDINGS: The heart size and mediastinal contours are within normal limits. Both lungs are clear. The visualized skeletal structures are unremarkable. IMPRESSION: No active cardiopulmonary disease. Electronically Signed   By: Constance Holster M.D.   On: 09/06/2019 15:32

## 2019-09-09 NOTE — Telephone Encounter (Signed)
Spoke with pt, aware of results/recs.  cpap ordered.  rov scheduled. Nothing further needed at this time- will close encounter.

## 2019-09-10 ENCOUNTER — Telehealth: Payer: Self-pay | Admitting: General Practice

## 2019-09-10 NOTE — Telephone Encounter (Signed)
re-faxed

## 2019-09-10 NOTE — Telephone Encounter (Signed)
Audrey Avery called with [facility] received request via fax for  pos TB test/ but needs that request faxed over again / missing full report needs notes on size in millimeters . Please resend with missing information   Any questions  [phone]

## 2019-09-19 DIAGNOSIS — I471 Supraventricular tachycardia, unspecified: Secondary | ICD-10-CM

## 2019-09-19 HISTORY — DX: Supraventricular tachycardia, unspecified: I47.10

## 2019-09-19 HISTORY — PX: TRANSTHORACIC ECHOCARDIOGRAM: SHX275

## 2019-09-19 HISTORY — DX: Supraventricular tachycardia: I47.1

## 2019-09-23 DIAGNOSIS — G4733 Obstructive sleep apnea (adult) (pediatric): Secondary | ICD-10-CM | POA: Diagnosis not present

## 2019-09-30 ENCOUNTER — Ambulatory Visit (INDEPENDENT_AMBULATORY_CARE_PROVIDER_SITE_OTHER): Payer: BC Managed Care – PPO | Admitting: Cardiology

## 2019-09-30 ENCOUNTER — Encounter: Payer: Self-pay | Admitting: *Deleted

## 2019-09-30 ENCOUNTER — Ambulatory Visit: Payer: BC Managed Care – PPO | Admitting: Cardiology

## 2019-09-30 ENCOUNTER — Encounter: Payer: Self-pay | Admitting: Cardiology

## 2019-09-30 ENCOUNTER — Other Ambulatory Visit: Payer: Self-pay

## 2019-09-30 DIAGNOSIS — R55 Syncope and collapse: Secondary | ICD-10-CM | POA: Insufficient documentation

## 2019-09-30 DIAGNOSIS — I10 Essential (primary) hypertension: Secondary | ICD-10-CM

## 2019-09-30 DIAGNOSIS — R0609 Other forms of dyspnea: Secondary | ICD-10-CM

## 2019-09-30 DIAGNOSIS — R0600 Dyspnea, unspecified: Secondary | ICD-10-CM | POA: Diagnosis not present

## 2019-09-30 NOTE — Progress Notes (Signed)
Patient ID: Audrey Avery, female   DOB: 1970-09-29, 49 y.o.   MRN: [id number] Patient enrolled for 14 day ZIO XT long term holter monitor to be shipped to her home.

## 2019-09-30 NOTE — Progress Notes (Signed)
Primary Care Provider: Patient, No Pcp Per Cardiologist: Glenetta Hew, MD Electrophysiologist: None  Clinic Note: Chief Complaint  Patient presents with  . New Patient (Initial Visit)  . Loss of Consciousness  . Leg Swelling    HPI:    Audrey Avery is a 49 y.o. female with a history of CHRONIC COUGH (since December 2019) AND COUGH RELATED SYNCOPE and recently diagnosed severe OSA (get a start CPAP who is being seen today for the evaluation of SYNCOPE AND COLLAPSE at the request of Wendie Agreste, MD.  Audrey Avery was seen on March 19 by Dr. Carlota Raspberry for complaint of swelling and bruising on legs as well as an episode of passing out.    Recent Hospitalizations: None  Reviewed  CV studies:    The following studies were reviewed today: (if available, images/films reviewed: From Epic Chart or Care Everywhere) . None:   Interval History:   Audrey Avery presents here today mostly discussed the results of her passout spell that occurred in a manner different than her typical cough related syncope. This was an unusual episode, since she indicates that she has had episodes of passing out while coughing the past.  This particular episode occurred as she was going up to bed, she sat down in the bed and grabbed her cell phone.  Just the last thing she remembers until she "came to "as she was falling off the bed.  She denied any signs or symptoms of rapid irregular heartbeats palpitations.  No chest pain or pressure.  She had none of the usual prodrome that she has when she is going to pass out which is a feeling of lightheadedness and flushness.  She also was not coughing or even noticing shortness of breath.  She does note that she has the capacity to "fall asleep at the drop of a hat ". She fell forward and scratched her head and face on the fracture, but did not have any significant trauma.  She also denied any post ictal symptoms of confusion or headache/nausea.  She  says she has had chronic cough since December 2019 when she had what sounds like a pneumonia or some type of viral infection.  And she has been coughing quite a bit ever since.  She will get coughing spells to the point where she will pass out--a couple times a month if coughing spells are bad.  She really denies any PND orthopnea symptoms.  She has a little bit of exertional dyspnea-for instance walking briskly up steps/a hill or carrying groceries up steps..  No chest pain or pressure with rest or exertion.  She is also noting bilateral leg swelling with some bruising.  Cardiovascular review of Symptoms (Summary): positive for - dyspnea on exertion, edema and Intermittent episodes of cough related passing out, but 1 additional episode noted without coughing. negative for - chest pain, irregular heartbeat, orthopnea, palpitations, paroxysmal nocturnal dyspnea, rapid heart rate or shortness of breath, TIA/amaurosis fugax, claudication.  No other near syncope  The patient does not have symptoms concerning for COVID-19 infection (fever, chills, cough, or new shortness of breath).  Has chronic cough but no fevers or chills. The patient is practicing social distancing & Masking.    REVIEWED OF SYSTEMS   Review of Systems  Constitutional: Negative for malaise/fatigue and weight loss.  HENT: Negative for congestion and nosebleeds.   Respiratory: Positive for cough. Negative for sputum production, shortness of breath and wheezing.  Recently diagnosed with OSA.  Yet to start CPAP  Cardiovascular: Positive for leg swelling.  Gastrointestinal: Negative for blood in stool and melena.  Genitourinary: Negative for hematuria.  Musculoskeletal: Negative for joint pain.  Neurological: Positive for dizziness (When she coughs) and loss of consciousness. Negative for sensory change, speech change, focal weakness and weakness.  Endo/Heme/Allergies: Bruises/bleeds easily.  Psychiatric/Behavioral: Negative  for memory loss. The patient is not nervous/anxious and does not have insomnia.    I have reviewed and (if needed) personally updated the patient's problem list, medications, allergies, past medical and surgical history, social and family history.   PAST MEDICAL HISTORY   Past Medical History:  Diagnosis Date  . Anemia   . Chronic cough 05/2018   With intermittent cough related syncope  . Hypertension   . Morbid obesity with BMI of 40.0-44.9, adult ([place])   . Obstructive sleep apnea of adult 2021   Recently diagnosed.  AHI 127.8, O2 nadir 51% (severe OSA) CPAP titration pending  . Syncope, tussive     PAST SURGICAL HISTORY   Past Surgical History:  Procedure Laterality Date  . ABDOMINAL HYSTERECTOMY    . BREAST SURGERY    . CESAREAN SECTION    . CHOLECYSTECTOMY      MEDICATIONS/ALLERGIES   Current Meds  Medication Sig  . amLODipine (NORVASC) 10 MG tablet Take 1 tablet (10 mg total) by mouth daily.  . fluticasone (FLONASE) 50 MCG/ACT nasal spray Place 1 spray into both nostrils daily.  . montelukast (SINGULAIR) 10 MG tablet Take 1 tablet (10 mg total) by mouth at bedtime.    Allergies  Allergen Reactions  . Penicillins Anaphylaxis and Swelling    Has patient had a PCN reaction causing immediate rash, facial/tongue/throat swelling, SOB or lightheadedness with hypotension: Yes Has patient had a PCN reaction causing severe rash involving mucus membranes or skin necrosis: No Has patient had a PCN reaction that required hospitalization No Has patient had a PCN reaction occurring within the last 10 years: No If all of the above answers are "NO", then may proceed with Cephalosporin use.     SOCIAL HISTORY/FAMILY HISTORY   Social History   Tobacco Use  . Smoking status: Never Smoker  . Smokeless tobacco: Never Used  Substance Use Topics  . Alcohol use: No  . Drug use: No   Social History   Social History Narrative  . Not on file   Family History  Problem  Relation Age of Onset  . Cancer Mother   . Hypertension Mother   . Diabetes Father   . Cancer Father   . Hypertension Father   . Heart attack Father 72  . CAD Father 36       She does not know details  . Cancer Sister   . Hypertension Sister     OBJCTIVE -PE, EKG, labs   Wt Readings from Last 3 Encounters:  09/30/19 248 lb (112.5 kg)  09/06/19 245 lb 6.4 oz (111.3 kg)  08/01/19 242 lb (109.8 kg)    Physical Exam: BP 128/76   Pulse 72   Temp 97.7 F (36.5 C)   Ht 5\' 6"  (1.676 m)   Wt 248 lb (112.5 kg)   BMI 40.03 kg/m  Physical Exam  Constitutional: She appears well-developed and well-nourished. No distress.  Healthy-appearing.  Well-groomed.  HENT:  Head: Normocephalic and atraumatic.  Neck: No hepatojugular reflux and no JVD present. Carotid bruit is not present.  Cardiovascular: Normal rate, regular rhythm, S1 normal, S2  normal and intact distal pulses.  No extrasystoles are present. PMI is not displaced (Difficult to palpate due to body habitus). Exam reveals distant heart sounds. Exam reveals no gallop and no friction rub.  Murmur heard. Pulmonary/Chest: Effort normal and breath sounds normal. No respiratory distress. She has no wheezes. She has no rales.  Abdominal: Soft. Bowel sounds are normal. She exhibits no distension. There is no abdominal tenderness. There is no rebound.  Musculoskeletal:        General: Edema (Trivial ankle edema) present. Normal range of motion.     Cervical back: Normal range of motion and neck supple.  Skin: Skin is warm and dry.  Psychiatric: She has a normal mood and affect. Her behavior is normal. Judgment and thought content normal.     Adult ECG Report From PCP office: Rate: 76 ;  Rhythm: normal sinus rhythm and Nonspecific ST and T wave changes.  Otherwise normal axis, intervals durations.;   Narrative Interpretation: Normal EKG  Recent Labs:    Lab Results  Component Value Date   CHOL 170 12/28/2017   HDL 52 12/28/2017    LDLCALC 105 (H) 12/28/2017   TRIG 67 12/28/2017   CHOLHDL 3.3 12/28/2017   Lab Results  Component Value Date   CREATININE 0.57 03/25/2019   BUN 9 03/25/2019   NA 140 03/25/2019   K 4.1 03/25/2019   CL 99 03/25/2019   CO2 29 03/25/2019   Lab Results  Component Value Date   TSH 1.590 09/20/2016    ASSESSMENT/PLAN    Problem List Items Addressed This Visit    Essential hypertension (Chronic)    Blood pressure well controlled on amlodipine.  She was not standing, she was sitting when she had a passout spell which would argue against this being a vasovagal /neurocardiogenic episode      Syncope and collapse    I do agree that this current episode seems different than her cough related syncope.  It is hard to tell if she actually passed out or fell asleep.  She had no prodrome of symptoms.  This would argue against a tachyarrhythmia, but could not discount bradycardia arrhythmia.   Unlikely to be a TIA or vascular related, however cannot exclude.  Plan: Standard evaluation with echocardiogram, carotid Dopplers focusing on vertebral arteries, and Zio patch monitor.  If this evaluation is inconclusive, would probably want to consider loop recorder if she has another one is unexplained episodes.       Relevant Orders   ECHOCARDIOGRAM COMPLETE   LONG TERM MONITOR (3-14 DAYS)   VAS US CAROTID   DOE (dyspnea on exertion)    This is probably related to obesity and deconditioning, however given that she is having edema and had a syncopal episode we will check a 2D echo just to confirm no evidence of reduced EF or elevated pulmonary pressures from OSA and possible OHS.      Relevant Orders   ECHOCARDIOGRAM COMPLETE   LONG TERM MONITOR (3-14 DAYS)   VAS US CAROTID       COVID-19 Education: The signs and symptoms of COVID-19 were discussed with the patient and how to seek care for testing (follow up with PCP or arrange E-visit).   The importance of social distancing was discussed  today.  I spent a total of 51minutes with the patient. >  50% of the time was spent in direct patient consultation.  Additional time spent with chart review  / charting (studies, outside notes, etc): 15  Total Time: 41 min   Current medicines are reviewed at length with the patient today.  (+/- concerns) n/a  Notice: This dictation was prepared with Dragon dictation along with smaller phrase technology. Any transcriptional errors that result from this process are unintentional and may not be corrected upon review.  Patient Instructions / Medication Changes & Studies & Tests Ordered   Patient Instructions  Medication Instructions:   NO CHANGES   *If you need a refill on your cardiac medications before your next appointment, please call your pharmacy*   Lab Work: NOT NEEDED   Testing/Procedures: WILL BE SCHEDULE AT [place] [facility] Your physician has requested that you have an echocardiogram. Echocardiography is a painless test that uses sound waves to create images of your heart. It provides your doctor with information about the size and shape of your heart and how well your heart's chambers and valves are working. This procedure takes approximately one hour. There are no restrictions for this procedure.  AND  WILL BE SCHEDULE AT [place] Your physician has requested that you have a carotid duplex. This test is an ultrasound of the carotid arteries in your neck. It looks at blood flow through these arteries that supply the brain with blood. Allow one hour for this exam. There are no restrictions or special instructions.  AND  Your physician has recommended that you wear a  14  DAY ZIO-PATCH monitor. The Zio patch cardiac monitor continuously records heart rhythm data for up to 14 days, this is for patients being evaluated for multiple types heart rhythms. For the first 24 hours post application, please avoid getting the Zio monitor wet in the  shower or by excessive sweating during exercise. After that, feel free to carry on with regular activities. Keep soaps and lotions away from the ZIO XT Patch.  This will be mailed to you, please expect 7-10 days to receive.   [employer] location - [place], [address].          Follow-Up: At [facility], you and your health needs are our priority.  As part of our continuing mission to provide you with exceptional heart care, we have created designated Provider Care Teams.  These Care Teams include your primary Cardiologist (physician) and Advanced Practice Providers (APPs -  Physician Assistants and Nurse Practitioners) who all work together to provide you with the care you need, when you need it.    Your next appointment:   7 TO 8  week(s)  The format for your next appointment:   In Person  Provider:   Glenetta Hew, MD   Other Instructions N/A   Bryn Gulling- Long Term Monitor Instructions   Your physician has requested you wear your ZIO patch monitor___14____days.   This is a single patch monitor.  [employer] supplies one patch monitor per enrollment.  Additional stickers are not available.   Please do not apply patch if you will be having a Nuclear Stress Test, Echocardiogram, Cardiac CT, MRI, or Chest Xray during the time frame you would be wearing the monitor. The patch cannot be worn during these tests.  You cannot remove and re-apply the ZIO XT patch monitor.   Your ZIO patch monitor will be sent USPS Priority mail from [facility] directly to your home address. The monitor may also be mailed to a PO BOX if home delivery is not available.   It may take 3-5 days to receive your  monitor after you have been enrolled.   Once you have received you monitor, please review enclosed instructions.  Your monitor has already been registered assigning a specific monitor serial # to you.   Applying the monitor   Shave hair from upper left chest.   Hold abrader disc by  orange tab.  Rub abrader in 40 strokes over left upper chest as indicated in your monitor instructions.   Clean area with 4 enclosed alcohol pads .  Use all pads to assure are is cleaned thoroughly.  Let dry.   Apply patch as indicated in monitor instructions.  Patch will be place under collarbone on left side of chest with arrow pointing upward.   Rub patch adhesive wings for 2 minutes.Remove white label marked "1".  Remove white label marked "2".  Rub patch adhesive wings for 2 additional minutes.   While looking in a mirror, press and release button in center of patch.  A small green light will flash 3-4 times .  This will be your only indicator the monitor has been turned on.     Do not shower for the first 24 hours.  You may shower after the first 24 hours.   Press button if you feel a symptom. You will hear a small click.  Record Date, Time and Symptom in the Patient Log Book.   When you are ready to remove patch, follow instructions on last 2 pages of Patient Log Book.  Stick patch monitor onto last page of Patient Log Book.   Place Patient Log Book in [place] box.  Use locking tab on box and tape box closed securely.  The [employer] has [employer] on it.  Please place in mailbox as soon as possible.  Your physician should have your test results approximately 7 days after the monitor has been mailed back to [facility].   Call [place] at [phone] if you have questions regarding your ZIO XT patch monitor.  Call them immediately if you see an orange light blinking on your monitor.   If your monitor falls off in less than 4 days contact our Monitor department at [phone].  If your monitor becomes loose or falls off after 4 days call [employer] at [phone] for suggestions on securing your monitor.       Studies Ordered:   Orders Placed This Encounter  Procedures  . LONG TERM MONITOR (3-14 DAYS)  . ECHOCARDIOGRAM COMPLETE  . VAS US  CAROTID     Glenetta Hew, M.D., M.S. Interventional Cardiologist   Pager # [phone] Phone # [phone] [address]. [place], [place] [address]   Thank you for choosing Heartcare at [facility]!!

## 2019-09-30 NOTE — Patient Instructions (Addendum)
Medication Instructions:   NO CHANGES   *If you need a refill on your cardiac medications before your next appointment, please call your pharmacy*   Lab Work: NOT NEEDED   Testing/Procedures: WILL BE SCHEDULE AT [place] [facility] Your physician has requested that you have an echocardiogram. Echocardiography is a painless test that uses sound waves to create images of your heart. It provides your doctor with information about the size and shape of your heart and how well your heart's chambers and valves are working. This procedure takes approximately one hour. There are no restrictions for this procedure.  AND  WILL BE SCHEDULE AT [place] Your physician has requested that you have a carotid duplex. This test is an ultrasound of the carotid arteries in your neck. It looks at blood flow through these arteries that supply the brain with blood. Allow one hour for this exam. There are no restrictions or special instructions.  AND  Your physician has recommended that you wear a  14  DAY ZIO-PATCH monitor. The Zio patch cardiac monitor continuously records heart rhythm data for up to 14 days, this is for patients being evaluated for multiple types heart rhythms. For the first 24 hours post application, please avoid getting the Zio monitor wet in the shower or by excessive sweating during exercise. After that, feel free to carry on with regular activities. Keep soaps and lotions away from the ZIO XT Patch.  This will be mailed to you, please expect 7-10 days to receive.   [employer] location - [place], [address].          Follow-Up: At [facility], you and your health needs are our priority.  As part of our continuing mission to provide you with exceptional heart care, we have created designated Provider Care Teams.  These Care Teams include your primary Cardiologist (physician) and Advanced Practice Providers (APPs -  Physician Assistants and  Nurse Practitioners) who all work together to provide you with the care you need, when you need it.    Your next appointment:   7 TO 8  week(s)  The format for your next appointment:   In Person  Provider:   Glenetta Hew, MD   Other Instructions N/A   Bryn Gulling- Long Term Monitor Instructions   Your physician has requested you wear your ZIO patch monitor___14____days.   This is a single patch monitor.  [employer] supplies one patch monitor per enrollment.  Additional stickers are not available.   Please do not apply patch if you will be having a Nuclear Stress Test, Echocardiogram, Cardiac CT, MRI, or Chest Xray during the time frame you would be wearing the monitor. The patch cannot be worn during these tests.  You cannot remove and re-apply the ZIO XT patch monitor.   Your ZIO patch monitor will be sent USPS Priority mail from [facility] directly to your home address. The monitor may also be mailed to a PO BOX if home delivery is not available.   It may take 3-5 days to receive your monitor after you have been enrolled.   Once you have received you monitor, please review enclosed instructions.  Your monitor has already been registered assigning a specific monitor serial # to you.   Applying the monitor   Shave hair from upper left chest.   Hold abrader disc by orange tab.  Rub abrader in 40 strokes over left upper chest as indicated in  your monitor instructions.   Clean area with 4 enclosed alcohol pads .  Use all pads to assure are is cleaned thoroughly.  Let dry.   Apply patch as indicated in monitor instructions.  Patch will be place under collarbone on left side of chest with arrow pointing upward.   Rub patch adhesive wings for 2 minutes.Remove white label marked "1".  Remove white label marked "2".  Rub patch adhesive wings for 2 additional minutes.   While looking in a mirror, press and release button in center of patch.  A small green light will flash 3-4  times .  This will be your only indicator the monitor has been turned on.     Do not shower for the first 24 hours.  You may shower after the first 24 hours.   Press button if you feel a symptom. You will hear a small click.  Record Date, Time and Symptom in the Patient Log Book.   When you are ready to remove patch, follow instructions on last 2 pages of Patient Log Book.  Stick patch monitor onto last page of Patient Log Book.   Place Patient Log Book in [place] box.  Use locking tab on box and tape box closed securely.  The [employer] has [employer] on it.  Please place in mailbox as soon as possible.  Your physician should have your test results approximately 7 days after the monitor has been mailed back to [facility].   Call [place] at [phone] if you have questions regarding your ZIO XT patch monitor.  Call them immediately if you see an orange light blinking on your monitor.   If your monitor falls off in less than 4 days contact our Monitor department at [phone].  If your monitor becomes loose or falls off after 4 days call [employer] at [phone] for suggestions on securing your monitor.

## 2019-10-01 ENCOUNTER — Telehealth: Payer: Self-pay | Admitting: Family Medicine

## 2019-10-01 NOTE — Telephone Encounter (Signed)
FYI...Please Advise  °

## 2019-10-01 NOTE — Telephone Encounter (Signed)
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  Varney Biles states that patient declined referral with their office.  Sending message to provider as a FYI.

## 2019-10-02 ENCOUNTER — Encounter: Payer: Self-pay | Admitting: Cardiology

## 2019-10-02 DIAGNOSIS — I10 Essential (primary) hypertension: Secondary | ICD-10-CM | POA: Insufficient documentation

## 2019-10-02 NOTE — Assessment & Plan Note (Signed)
I do agree that this current episode seems different than her cough related syncope.  It is hard to tell if she actually passed out or fell asleep.  She had no prodrome of symptoms.  This would argue against a tachyarrhythmia, but could not discount bradycardia arrhythmia.   Unlikely to be a TIA or vascular related, however cannot exclude.  Plan: Standard evaluation with echocardiogram, carotid Dopplers focusing on vertebral arteries, and Zio patch monitor.  If this evaluation is inconclusive, would probably want to consider loop recorder if she has another one is unexplained episodes.

## 2019-10-02 NOTE — Assessment & Plan Note (Signed)
This is probably related to obesity and deconditioning, however given that she is having edema and had a syncopal episode we will check a 2D echo just to confirm no evidence of reduced EF or elevated pulmonary pressures from OSA and possible OHS.

## 2019-10-02 NOTE — Assessment & Plan Note (Addendum)
Blood pressure well controlled on amlodipine.  She was not standing, she was sitting when she had a passout spell which would argue against this being a vasovagal /neurocardiogenic episode

## 2019-10-03 ENCOUNTER — Other Ambulatory Visit (INDEPENDENT_AMBULATORY_CARE_PROVIDER_SITE_OTHER): Payer: BC Managed Care – PPO

## 2019-10-03 ENCOUNTER — Encounter: Payer: Self-pay | Admitting: Family Medicine

## 2019-10-03 DIAGNOSIS — R0600 Dyspnea, unspecified: Secondary | ICD-10-CM | POA: Diagnosis not present

## 2019-10-03 DIAGNOSIS — R55 Syncope and collapse: Secondary | ICD-10-CM

## 2019-10-03 DIAGNOSIS — R0609 Other forms of dyspnea: Secondary | ICD-10-CM

## 2019-10-04 NOTE — Telephone Encounter (Signed)
Pt would like a weight loss medication, does she need to make an appointment for this?

## 2019-10-04 NOTE — Telephone Encounter (Signed)
Tried to call pt but mailbox is full.

## 2019-10-04 NOTE — Telephone Encounter (Signed)
Please call patient, more information please.  It appears that [facility] department tried to contact her to discuss the positive PPD.  Where there specific concerns with that appointment or does she have questions?

## 2019-10-07 ENCOUNTER — Other Ambulatory Visit: Payer: Self-pay

## 2019-10-07 ENCOUNTER — Ambulatory Visit (HOSPITAL_COMMUNITY)
Admission: RE | Admit: 2019-10-07 | Discharge: 2019-10-07 | Disposition: A | Discharge status: Home / Self Care | Payer: BC Managed Care – PPO | Source: Ambulatory Visit | Attending: Cardiovascular Disease | Admitting: Cardiovascular Disease

## 2019-10-07 DIAGNOSIS — R55 Syncope and collapse: Secondary | ICD-10-CM

## 2019-10-07 DIAGNOSIS — R0609 Other forms of dyspnea: Secondary | ICD-10-CM

## 2019-10-07 DIAGNOSIS — R0600 Dyspnea, unspecified: Secondary | ICD-10-CM | POA: Diagnosis not present

## 2019-10-15 ENCOUNTER — Ambulatory Visit (HOSPITAL_COMMUNITY): Payer: BC Managed Care – PPO | Attending: Internal Medicine

## 2019-10-15 ENCOUNTER — Other Ambulatory Visit: Payer: Self-pay

## 2019-10-15 DIAGNOSIS — R0609 Other forms of dyspnea: Secondary | ICD-10-CM

## 2019-10-15 DIAGNOSIS — R55 Syncope and collapse: Secondary | ICD-10-CM | POA: Insufficient documentation

## 2019-10-15 DIAGNOSIS — R0600 Dyspnea, unspecified: Secondary | ICD-10-CM | POA: Diagnosis not present

## 2019-10-23 DIAGNOSIS — G4733 Obstructive sleep apnea (adult) (pediatric): Secondary | ICD-10-CM | POA: Diagnosis not present

## 2019-11-11 ENCOUNTER — Other Ambulatory Visit: Payer: Self-pay

## 2019-11-11 ENCOUNTER — Ambulatory Visit (INDEPENDENT_AMBULATORY_CARE_PROVIDER_SITE_OTHER): Payer: BC Managed Care – PPO | Admitting: Internal Medicine

## 2019-11-11 ENCOUNTER — Ambulatory Visit (INDEPENDENT_AMBULATORY_CARE_PROVIDER_SITE_OTHER): Payer: BC Managed Care – PPO | Admitting: Cardiology

## 2019-11-11 ENCOUNTER — Encounter: Payer: Self-pay | Admitting: Cardiology

## 2019-11-11 ENCOUNTER — Encounter: Payer: Self-pay | Admitting: Internal Medicine

## 2019-11-11 VITALS — BP 128/80 | HR 76 | Ht 66.0 in | Wt 234.8 lb

## 2019-11-11 DIAGNOSIS — R053 Chronic cough: Secondary | ICD-10-CM

## 2019-11-11 DIAGNOSIS — I10 Essential (primary) hypertension: Secondary | ICD-10-CM | POA: Diagnosis not present

## 2019-11-11 DIAGNOSIS — G4733 Obstructive sleep apnea (adult) (pediatric): Secondary | ICD-10-CM

## 2019-11-11 DIAGNOSIS — R55 Syncope and collapse: Secondary | ICD-10-CM

## 2019-11-11 DIAGNOSIS — R0600 Dyspnea, unspecified: Secondary | ICD-10-CM

## 2019-11-11 DIAGNOSIS — R05 Cough: Secondary | ICD-10-CM | POA: Diagnosis not present

## 2019-11-11 DIAGNOSIS — R0609 Other forms of dyspnea: Secondary | ICD-10-CM

## 2019-11-11 NOTE — Progress Notes (Signed)
Primary Care Provider: Wendie Agreste, MD Cardiologist: Audrey Hew, MD Electrophysiologist: None  Clinic Note: Chief Complaint  Patient presents with  . Follow-up    Doing better.  Has started CPAP.  Marland Kitchen Loss of Consciousness    No further episodes    HPI:    Audrey Avery is a 49 y.o. female with a history of CHRONIC COUGH (since December 2019) AND COUGH RELATED SYNCOPE and recently diagnosed severe OSA (new start CPAP) who is being seen today for follow-up evaluation of SYNCOPE AND COLLAPSE at the request of Audrey Agreste, MD.  Audrey Avery was referred for initial consultation on April 12 after being seen on March 19 by Audrey Avery for complaint of swelling and bruising on legs as well as an episode of passing out.  -->  Audrey Avery has had history of cough syncope in the past, but that her passout spell that led to this visit was somewhat different.  I she actually noted it as she was going to bed, she sat down to grab her cell phone and muscle blacked out because as she came to she was falling to the floor from the bed.  No symptoms of irregular heartbeats or palpitations.  No chest pain or pressure.  She did note that she has a tendency to "fall asleep at the drop of a hat ".  Recent Hospitalizations: None  Reviewed  CV studies:    The following studies were reviewed today: (if available, images/films reviewed: From Epic Chart or Care Everywhere) . October 15, 2019 Echocardiogram: EF 60 to 65%.  Mild LVH,  normal valves. Marland Kitchen April 2021 monitor: Showed mostly sinus rhythm, rate 50 to 130 bpm.  Average 78 bpm.  Rare PACs and PVCs.  One short burst 4 beats nonsustained V. tach.  62 episodes of SVT ranging from 4-19 beats. Marland Kitchen October 07, 2019 Carotid Dopplers: Normal carotid and vertebral arteries.  Minimal plaque.   Interval History:   Audrey Avery presents here today mostly discussed the results of her studies.  She was happy to hear that her echo and carotids were  normal.  We discussed her monitor results, and she really does not recall feeling that many episodes of palpitations or irregular heartbeats.  Overall, she says that her coughing is a lot better.  She is now on CPAP and sleeping a lot better as well.  She has not had any further passout spells either from coughing or similar to the when she had prior to last visit.  She actually has not had any cardiac symptoms to speak of besides a little deconditioning related exertional dyspnea if she is carrying something upstairs.. Leg swelling also seems to be better..  Cardiovascular review of Symptoms (Summary): positive for - dyspnea on exertion, edema and Swelling is better. negative for - chest pain, irregular heartbeat, loss of consciousness, orthopnea, palpitations, paroxysmal nocturnal dyspnea, rapid heart rate, shortness of breath or TIA/amaurosis fugax, claudication.  No near syncope.,   The patient DOES NOT have symptoms concerning for COVID-19 infection (fever, chills, cough, or new shortness of breath).  Has chronic cough but no fevers or chills. The patient is practicing social distancing & Masking.   She is looking into getting her Covid vaccine.   REVIEWED OF SYSTEMS   Review of Systems  Constitutional: Negative for malaise/fatigue and weight loss.  HENT: Negative for congestion and nosebleeds.   Respiratory: Positive for cough (Much better). Negative for sputum production, shortness of breath and  wheezing.        Recently started on CPAP  Cardiovascular: Positive for leg swelling.  Gastrointestinal: Negative for blood in stool and melena.  Genitourinary: Negative for hematuria.  Musculoskeletal: Negative for joint pain.  Neurological: Positive for dizziness (When she coughs). Negative for sensory change, speech change, focal weakness, loss of consciousness and weakness.  Endo/Heme/Allergies: Bruises/bleeds easily.  Psychiatric/Behavioral: Negative for memory loss. The patient is not  nervous/anxious and does not have insomnia.   All other systems reviewed and are negative.  I have reviewed and (if needed) personally updated the patient's problem list, medications, allergies, past medical and surgical history, social and family history.   PAST MEDICAL HISTORY   Past Medical History:  Diagnosis Date  . Anemia   . Chronic cough 05/2018   With intermittent cough related syncope  . Hypertension   . Morbid obesity with BMI of 40.0-44.9, adult ([place])   . Obstructive sleep apnea of adult 2021   Recently diagnosed.  AHI 127.8, O2 nadir 51% (severe OSA) CPAP titration pending  . PSVT (paroxysmal supraventricular tachycardia) ([place]) 09/2019   Cardiac monitor showed 62 episodes of short bursts of SVT ranging from 4-19 beats.  . Syncope, tussive     PAST SURGICAL HISTORY   Past Surgical History:  Procedure Laterality Date  . ABDOMINAL HYSTERECTOMY    . BREAST SURGERY    . CESAREAN SECTION    . CHOLECYSTECTOMY    . TRANSTHORACIC ECHOCARDIOGRAM  09/2019   EF 60 to 65%.  Mild LVH,  normal valves..  Normal echo    MEDICATIONS/ALLERGIES   Current Meds  Medication Sig  . amLODipine (NORVASC) 10 MG tablet Take 1 tablet (10 mg total) by mouth daily.  . fluticasone (FLONASE) 50 MCG/ACT nasal spray Place 1 spray into both nostrils daily.  . montelukast (SINGULAIR) 10 MG tablet Take 1 tablet (10 mg total) by mouth at bedtime.    Allergies  Allergen Reactions  . Penicillins Anaphylaxis and Swelling    Has patient had a PCN reaction causing immediate rash, facial/tongue/throat swelling, SOB or lightheadedness with hypotension: Yes Has patient had a PCN reaction causing severe rash involving mucus membranes or skin necrosis: No Has patient had a PCN reaction that required hospitalization No Has patient had a PCN reaction occurring within the last 10 years: No If all of the above answers are "NO", then may proceed with Cephalosporin use.     SOCIAL HISTORY/FAMILY HISTORY     Social History   Tobacco Use  . Smoking status: Never Smoker  . Smokeless tobacco: Never Used  Substance Use Topics  . Alcohol use: No  . Drug use: No   Social History   Social History Narrative  . Not on file   Family History  Problem Relation Age of Onset  . Cancer Mother   . Hypertension Mother   . Diabetes Father   . Cancer Father   . Hypertension Father   . Heart attack Father 47  . CAD Father 77       She does not know details  . Cancer Sister   . Hypertension Sister     OBJCTIVE -PE, EKG, labs   Wt Readings from Last 3 Encounters:  11/11/19 234 lb 12.8 oz (106.5 kg)  11/11/19 233 lb 12.8 oz (106.1 kg)  09/30/19 248 lb (112.5 kg)    Physical Exam: BP 128/80   Pulse 76   Ht 5\' 6"  (1.676 m)   Wt 234 lb 12.8 oz (106.5  kg)   SpO2 100%   BMI 37.90 kg/m  Physical Exam  Constitutional: She is oriented to person, place, and time. She appears well-developed and well-nourished. No distress.  Moderately obese, but otherwise healthy-appearing.  Well-groomed  HENT:  Head: Normocephalic and atraumatic.  Neck: No hepatojugular reflux and no JVD present. Carotid bruit is not present.  Cardiovascular: Normal rate, regular rhythm, S1 normal, S2 normal and intact distal pulses.  No extrasystoles are present. PMI is not displaced (Difficult to palpate due to body habitus). Exam reveals distant heart sounds. Exam reveals no gallop and no friction rub.  Murmur heard. Pulmonary/Chest: Effort normal and breath sounds normal. No respiratory distress. She has no wheezes. She has no rales.  Musculoskeletal:        General: Edema (Trivial ankle) present. Normal range of motion.     Cervical back: Normal range of motion and neck supple.  Neurological: She is alert and oriented to person, place, and time.  Skin: Skin is warm.  Psychiatric: She has a normal mood and affect. Her behavior is normal. Judgment and thought content normal.     Adult ECG Report From PCP office:  Rate: 76 ;  Rhythm: normal sinus rhythm and Nonspecific ST and T wave changes.  Otherwise normal axis, intervals durations.;   Narrative Interpretation: Normal EKG  Recent Labs:    Lab Results  Component Value Date   CHOL 170 12/28/2017   HDL 52 12/28/2017   LDLCALC 105 (H) 12/28/2017   TRIG 67 12/28/2017   CHOLHDL 3.3 12/28/2017   Lab Results  Component Value Date   CREATININE 0.57 03/25/2019   BUN 9 03/25/2019   NA 140 03/25/2019   K 4.1 03/25/2019   CL 99 03/25/2019   CO2 29 03/25/2019   Lab Results  Component Value Date   TSH 1.590 09/20/2016    ASSESSMENT/PLAN    Problem List Items Addressed This Visit    Essential hypertension (Chronic)    Blood pressures well controlled on combination of amlodipine.  With her tendency to have short bursts of SVT, would consider beta-blocker as next round if additional blood pressure treatment is required.      OSA (obstructive sleep apnea)    On CPAP now.  Doing well.  Cough is also notably improved.      Syncope and collapse - Primary    Thankfully, no further episodes.  Work-up so far relatively noncontributory.  I do not think that she would have passout spells from short bursts of SVT.  She actually did not feel these episodes, and her spasm spells not associate with any sensation of palpitations.  Usually one would expect SVT to be quite prolonged and rapid prior to leading to syncope, and her fastest episode of 171 beats minute was only 4-6 beats.  Longest episode of 19 beats was only 132 bpm.  Neither of these heart rates would be enough to cause syncope.  For now would continue to monitor.  I suspect that her passout spell could be really related to actually falling asleep.  At this point however, if she were to have another spell, I would strongly consider loop recorder to evaluate for bradycardia or more rapid tachycardia episodes.      DOE (dyspnea on exertion)    Relatively normal 2D echocardiogram.  Would argue  against any significant CHF (HFrEF or HFpEF).  No evidence pulmonary hypertension.  If symptoms were to worsen, may need to consider GXT/Myoview  COVID-19 Education: The signs and symptoms of COVID-19 were discussed with the patient and how to seek care for testing (follow up with PCP or arrange E-visit).   The importance of social distancing was discussed today.  I spent a total of 27minutes with the patient. >  50% of the time was spent in direct patient consultation.  Additional time spent with chart review  / charting (studies, outside notes, etc): 15 Total Time: 41 min   Current medicines are reviewed at length with the patient today.  (+/- concerns) n/a  Notice: This dictation was prepared with Dragon dictation along with smaller phrase technology. Any transcriptional errors that result from this process are unintentional and may not be corrected upon review.  Patient Instructions / Medication Changes & Studies & Tests Ordered   Patient Instructions  Medication Instructions:  No changes *If you need a refill on your cardiac medications before your next appointment, please call your pharmacy*   Lab Work: Not needed   Testing/Procedures: Not needed   Follow-Up: At [facility], you and your health needs are our priority.  As part of our continuing mission to provide you with exceptional heart care, we have created designated Provider Care Teams.  These Care Teams include your primary Cardiologist (physician) and Advanced Practice Providers (APPs -  Physician Assistants and Nurse Practitioners) who all work together to provide you with the care you need, when you need it.      Your next appointment:   12 month(s)  The format for your next appointment:   In Person  Provider:   Glenetta Hew, MD       Studies Ordered:   No orders of the defined types were placed in this encounter.    Audrey Avery, M.D., M.S. Interventional Cardiologist    Pager # [phone] Phone # [phone] [address] [place], [place] [address]   Thank you for choosing Heart[facility] at [facility]!!

## 2019-11-11 NOTE — Patient Instructions (Signed)

## 2019-11-11 NOTE — Progress Notes (Signed)
Audrey Avery    [id number]    04-18-1971  Primary Care Physician:Greene, Ranell Patrick, MD Date of Appointment: 11/11/2019 Established Patient Visit  Chief complaint:   Chief Complaint  Patient presents with  . Follow-up    cough, osa.  CPAP, sleeping throughout the night.  cough has stopped.    HPI: Audrey Avery is a 50 y.o. woman with chronic cough and syncopal episodes. Also severe OSA diagnosed March 2021. Here for follow up.  Interval Updates: Was prescribed CPAP started on April 18th. Wondering if there's a different mask for her.  Started back on flonase and singulair for her cough at last visit. Her cough is resolved! She  Had a questionable positive PPD in the interim and chest xray negative. She spoke with health department and it was minimally indurated and no further treatment was recommended. (52mm)  I have reviewed her CPAP download and she is having persistent AHI 44.7 despite nightly use over 6 hours almost every night. Significant leaking noted in mask.   Her Syncope is resolved. No longer needing incontinence pads.   I have reviewed the patient's family social and past medical history and updated as appropriate.   Past Medical History:  Diagnosis Date  . Anemia   . Chronic cough 05/2018   With intermittent cough related syncope  . Hypertension   . Morbid obesity with BMI of 40.0-44.9, adult ([place])   . Obstructive sleep apnea of adult 2021   Recently diagnosed.  AHI 127.8, O2 nadir 51% (severe OSA) CPAP titration pending  . Syncope, tussive     Past Surgical History:  Procedure Laterality Date  . ABDOMINAL HYSTERECTOMY    . BREAST SURGERY    . CESAREAN SECTION    . CHOLECYSTECTOMY      Family History  Problem Relation Age of Onset  . Cancer Mother   . Hypertension Mother   . Diabetes Father   . Cancer Father   . Hypertension Father   . Heart attack Father 24  . CAD Father 23       She does not know details  . Cancer Sister    . Hypertension Sister     Social History   Occupational History    Employer: [employer]  Tobacco Use  . Smoking status: Never Smoker  . Smokeless tobacco: Never Used  Substance and Sexual Activity  . Alcohol use: No  . Drug use: No  . Sexual activity: Not on file     Physical Exam: Blood pressure 120/90, pulse 92, temperature 98.7 F (37.1 C), temperature source Oral, height 5\' 6"  (1.676 m), weight 233 lb 12.8 oz (106.1 kg), SpO2 98 %.  Gen:      No acute distress ENT:       Mallampati IV, enlarged tonsils.  Lungs:    No increased respiratory effort, symmetric chest wall excursion, clear to auscultation bilaterally, no wheezes or crackles CV:         Regular rate and rhythm; no murmurs, rubs, or gallops.  No pedal edema   Data Reviewed: Imaging: I have personally reviewed the chest xray March 2021 no acute cardiopulmonary process.   PFTs: None on file.   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 PPD Test 09/06/2019    Assessment:  Chronic Cough - likely secondary to rhinitis Severe OSA with persistently elevated AHI.    Plan/Recommendations: Cough is  improved. Continue sinulair and flonase Reviewed sleep download. Persistent AHI despite good adherance. Significant leak.  Will order CPAP titration study.   Return to Care: Return in about 4 months (around 03/13/2020) for Sleep Apnea.   Lenice Llamas, MD Pulmonary and [place]

## 2019-11-11 NOTE — Patient Instructions (Signed)
The patient should have follow up scheduled 4 months with myself for OSA.   Prior to next visit patient should have: CPAP titration study

## 2019-11-16 ENCOUNTER — Encounter: Payer: Self-pay | Admitting: Cardiology

## 2019-11-16 NOTE — Assessment & Plan Note (Addendum)
Relatively normal 2D echocardiogram.  Would argue against any significant CHF (HFrEF or HFpEF).  No evidence pulmonary hypertension.  If symptoms were to worsen, may need to consider GXT/Myoview

## 2019-11-16 NOTE — Assessment & Plan Note (Signed)
Blood pressures well controlled on combination of amlodipine.  With her tendency to have short bursts of SVT, would consider beta-blocker as next round if additional blood pressure treatment is required.

## 2019-11-16 NOTE — Assessment & Plan Note (Signed)
Thankfully, no further episodes.  Work-up so far relatively noncontributory.  I do not think that she would have passout spells from short bursts of SVT.  She actually did not feel these episodes, and her spasm spells not associate with any sensation of palpitations.  Usually one would expect SVT to be quite prolonged and rapid prior to leading to syncope, and her fastest episode of 171 beats minute was only 4-6 beats.  Longest episode of 19 beats was only 132 bpm.  Neither of these heart rates would be enough to cause syncope.  For now would continue to monitor.  I suspect that her passout spell could be really related to actually falling asleep.  At this point however, if she were to have another spell, I would strongly consider loop recorder to evaluate for bradycardia or more rapid tachycardia episodes.

## 2019-11-16 NOTE — Assessment & Plan Note (Signed)
On CPAP now.  Doing well.  Cough is also notably improved.

## 2019-11-22 ENCOUNTER — Other Ambulatory Visit (HOSPITAL_COMMUNITY): Payer: BC Managed Care – PPO

## 2019-11-23 DIAGNOSIS — G4733 Obstructive sleep apnea (adult) (pediatric): Secondary | ICD-10-CM | POA: Diagnosis not present

## 2019-11-24 ENCOUNTER — Encounter (HOSPITAL_BASED_OUTPATIENT_CLINIC_OR_DEPARTMENT_OTHER): Payer: BC Managed Care – PPO | Admitting: Pulmonary Disease

## 2019-11-25 ENCOUNTER — Ambulatory Visit: Payer: BC Managed Care – PPO | Admitting: Cardiology

## 2019-11-26 ENCOUNTER — Encounter: Payer: Self-pay | Admitting: Family Medicine

## 2019-11-27 ENCOUNTER — Other Ambulatory Visit: Payer: Self-pay

## 2019-11-27 DIAGNOSIS — L700 Acne vulgaris: Secondary | ICD-10-CM

## 2019-11-27 DIAGNOSIS — I10 Essential (primary) hypertension: Secondary | ICD-10-CM

## 2019-11-27 NOTE — Telephone Encounter (Signed)
Sent a medication request for the Cream to make sure refill is appropriate refill at this time

## 2019-11-27 NOTE — Telephone Encounter (Signed)
Patient is requesting a refill of the following medications: Requested Prescriptions   Pending Prescriptions Disp Refills  . amLODipine (NORVASC) 10 MG tablet 90 tablet 3    Sig: Take 1 tablet (10 mg total) by mouth daily.  Marland Kitchen tretinoin (RETIN-A) 0.025 % cream 45 g 11    Sig: Apply topically at bedtime.    Date of patient request: 11/26/2019 Last office visit: 09/09/2019 Date of last refill: 12/28/2017 Last refill amount: 90 tablet 3 refills Follow up time period per chart: N/A

## 2019-11-28 ENCOUNTER — Telehealth: Payer: Self-pay | Admitting: *Deleted

## 2019-11-28 ENCOUNTER — Other Ambulatory Visit: Payer: Self-pay | Admitting: Family Medicine

## 2019-11-28 ENCOUNTER — Telehealth: Payer: Self-pay

## 2019-11-28 DIAGNOSIS — I10 Essential (primary) hypertension: Secondary | ICD-10-CM

## 2019-11-28 MED ORDER — AMLODIPINE BESYLATE 10 MG PO TABS: 10.0000 mg | ORAL_TABLET | Freq: Every day | ORAL | 0 refills | Status: DC

## 2019-11-28 NOTE — Telephone Encounter (Signed)
Copied from [place] [id number]. Topic: Quick Communication - Rx Refill/Question >> Nov 28, 2019 11:36 AM Leward Quan A wrote: Medication: tretinoin (RETIN-A) 0.025 % cream, amLODipine (NORVASC) 10 MG tablet Been out of Amlodipine for 2 weeks need Rx today please  Has the patient contacted their pharmacy? Yes.   (Agent: If no, request that the patient contact the pharmacy for the refill.) (Agent: If yes, when and what did the pharmacy advise?)  Preferred Pharmacy (with phone number or street name): [id number] - [place], [place]  Phone:  [phone] Fax:  [phone]     Agent: Please be advised that RX refills may take up to 3 business days. We ask that you follow-up with your pharmacy.

## 2019-11-28 NOTE — Telephone Encounter (Signed)
Medication sent to the pharmacy.

## 2019-11-28 NOTE — Telephone Encounter (Signed)
Pt. And pharmacy requesting refill of Retin-A cream 0.025%.Medication is not on med list. Pharmacy reports originally ordered by Philis Fendt. Please advise pt.

## 2019-11-28 NOTE — Telephone Encounter (Signed)
Amlodipine was refilled today. It appears request for Retin-A was received 01/2019 and refused  - needed appt to discuss medication.  Unfortunately I do not see where that medication has been discussed since it was prescribed in July 2019.  Please schedule visit.  I am fine with that as a virtual visit if she would prefer.  Thank you.

## 2019-11-28 NOTE — Telephone Encounter (Signed)
Pt is calling requesting retina cream 0.025% for periodic flare that she has. Is it ok to send or do you prefer face to face? Last given by Philis Fendt

## 2019-11-28 NOTE — Telephone Encounter (Addendum)
See refill for amlodipine filled  and refill request on 11/28/19, sent to office for final disposition. Copied from [place] [id number]. Topic: Quick Communication - Rx Refill/Question >> Nov 28, 2019 11:36 AM Leward Quan A wrote: Medication: tretinoin (RETIN-A) 0.025 % cream, amLODipine (NORVASC) 10 MG tablet Completely out of Amlodipine for about 2 weeks nee Rx today please   Has the patient contacted their pharmacy? Yes.   (Agent: If no, request that the patient contact the pharmacy for the refill.) (Agent: If yes, when and what did the pharmacy advise?)  Preferred Pharmacy (with phone number or street name): [id number] - [place], [place]  Phone:  [phone] Fax:  [phone]     Agent: Please be advised that RX refills may take up to 3 business days. We ask that you follow-up with your pharmacy.

## 2019-11-29 ENCOUNTER — Telehealth: Payer: Self-pay

## 2019-11-29 NOTE — Telephone Encounter (Signed)
Patient's been informed of the need for an appointment in order to refill Retin A per drs notes. Pt states work schedule does not currently allow it. she will call back as needed.

## 2019-12-23 DIAGNOSIS — G4733 Obstructive sleep apnea (adult) (pediatric): Secondary | ICD-10-CM | POA: Diagnosis not present

## 2019-12-24 DIAGNOSIS — G4733 Obstructive sleep apnea (adult) (pediatric): Secondary | ICD-10-CM | POA: Diagnosis not present

## 2020-01-23 DIAGNOSIS — G4733 Obstructive sleep apnea (adult) (pediatric): Secondary | ICD-10-CM | POA: Diagnosis not present

## 2020-02-23 DIAGNOSIS — G4733 Obstructive sleep apnea (adult) (pediatric): Secondary | ICD-10-CM | POA: Diagnosis not present

## 2020-03-13 ENCOUNTER — Ambulatory Visit (INDEPENDENT_AMBULATORY_CARE_PROVIDER_SITE_OTHER): Payer: BC Managed Care – PPO | Admitting: Internal Medicine

## 2020-03-13 ENCOUNTER — Other Ambulatory Visit: Payer: Self-pay

## 2020-03-13 ENCOUNTER — Other Ambulatory Visit: Payer: Self-pay | Admitting: Family Medicine

## 2020-03-13 ENCOUNTER — Encounter: Payer: Self-pay | Admitting: Internal Medicine

## 2020-03-13 VITALS — BP 150/100 | HR 97 | Temp 97.3°F | Ht 66.0 in | Wt 231.4 lb

## 2020-03-13 DIAGNOSIS — I10 Essential (primary) hypertension: Secondary | ICD-10-CM

## 2020-03-13 DIAGNOSIS — G4733 Obstructive sleep apnea (adult) (pediatric): Secondary | ICD-10-CM | POA: Diagnosis not present

## 2020-03-13 DIAGNOSIS — J301 Allergic rhinitis due to pollen: Secondary | ICD-10-CM

## 2020-03-13 DIAGNOSIS — Z9989 Dependence on other enabling machines and devices: Secondary | ICD-10-CM | POA: Diagnosis not present

## 2020-03-13 NOTE — Telephone Encounter (Signed)
Requested medication (s) are due for refill today: yes  Requested medication (s) are on the active medication list: yes  Last refill:  11/28/19  #90  0 refills  Future visit scheduled: No  Notes to clinic: Needs OV    Requested Prescriptions  Pending Prescriptions Disp Refills   amLODipine ([place]) 10 MG tablet [Pharmacy Med Name: AMLODIPINE BESYLATE 10 MG TAB] 90 tablet 0    Sig: TAKE 1 TABLET BY MOUTH EVERY DAY      Cardiovascular:  Calcium Channel Blockers Failed - 03/13/2020  4:31 PM      Failed - Last BP in normal range    BP Readings from Last 1 Encounters:  03/13/20 (!) 150/100          Failed - Valid encounter within last 6 months    Recent Outpatient Visits           6 months ago Positive PPD   [facility], Ranell Patrick, MD   6 months ago Syncope, unspecified syncope type   [facility], Ranell Patrick, MD   11 months ago Cough   [facility], Ranell Patrick, MD   1 year ago Cough   [facility], Ranell Patrick, MD   2 years ago Heat sensitivity   [facility], PA-C

## 2020-03-13 NOTE — Patient Instructions (Signed)
The patient should have follow up scheduled with myself in 1 months.

## 2020-03-13 NOTE — Progress Notes (Signed)
fo

## 2020-03-13 NOTE — Progress Notes (Signed)
Audrey Avery    [id number]    Jun 22, 1970  Primary Care Physician:Greene, Ranell Patrick, MD Date of Appointment: 03/13/2020 Established Patient Visit  Chief complaint:   Chief Complaint  Patient presents with   Follow-up    has a new mask, has nose plugs. no download     HPI: Audrey Avery is a 49 y.o. woman with chronic cough and syncopal episodes. Also severe OSA diagnosed March 2021. Here for follow up.  Interval Updates: Started nose plugs with her face mask and has been sleeping much better. She feels better rested.  I have reviewed her CPAP download today and she has excellent adherence with no leak and minimal AHI.   Stopped singulair because it made her feel tired. Still takes flonase and it helps.   I have reviewed the patient's family social and past medical history and updated as appropriate.   Past Medical History:  Diagnosis Date   Anemia    Chronic cough 05/2018   With intermittent cough related syncope   Hypertension    Morbid obesity with BMI of 40.0-44.9, adult ([place])    Obstructive sleep apnea of adult 2021   Recently diagnosed.  AHI 127.8, O2 nadir 51% (severe OSA) CPAP titration pending   PSVT (paroxysmal supraventricular tachycardia) ([place]) 09/2019   Cardiac monitor showed 62 episodes of short bursts of SVT ranging from 4-19 beats.   Syncope, tussive     Past Surgical History:  Procedure Laterality Date   ABDOMINAL HYSTERECTOMY     BREAST SURGERY     CESAREAN SECTION     CHOLECYSTECTOMY     TRANSTHORACIC ECHOCARDIOGRAM  09/2019   EF 60 to 65%.  Mild LVH,  normal valves..  Normal echo    Family History  Problem Relation Age of Onset   Cancer Mother    Hypertension Mother    Diabetes Father    Cancer Father    Hypertension Father    Heart attack Father 64   CAD Father 65       She does not know details   Cancer Sister    Hypertension Sister     Social History   Occupational History    Employer:  [employer]  Tobacco Use   Smoking status: Never Smoker   Smokeless tobacco: Never Used  Scientific laboratory technician Use: Never used  Substance and Sexual Activity   Alcohol use: No   Drug use: No   Sexual activity: Not on file     Physical Exam: Blood pressure (!) 150/100, pulse 97, temperature (!) 97.3 F (36.3 C), temperature source Temporal, height 5\' 6"  (1.676 m), weight 231 lb 6.4 oz (105 kg), SpO2 98 %.  Gen:      No acute distress ENT:       Mallampati IV, enlarged tonsils.  Lungs:    No increased respiratory effort, symmetric chest wall excursion, clear to auscultation bilaterally, no wheezes or crackles CV:         Regular rate and rhythm; no murmurs, rubs, or gallops.  No pedal edema   Data Reviewed: Imaging: I have personally reviewed the chest xray March 2021 no acute cardiopulmonary process.   PFTs: None on file.   Labs: Positive PPD March 2021  Immunization status: Immunization History  Administered Date(s) Administered   PFIZER SARS-COV-2 Vaccination 01/11/2020, 02/01/2020   PPD Test 09/06/2019    Assessment:  Allergic Rhinitis, controlled Severe OSA with improved control  Plan/Recommendations: Continue flonase. Continue CPAP.  Offered her flu shot. She declined today.  Return to Care: Return in about 1 year (around 03/13/2021).   Lenice Llamas, MD [facility]monary [facility] [employer]

## 2020-03-24 DIAGNOSIS — G4733 Obstructive sleep apnea (adult) (pediatric): Secondary | ICD-10-CM | POA: Diagnosis not present

## 2020-03-30 ENCOUNTER — Other Ambulatory Visit: Payer: Self-pay | Admitting: Family Medicine

## 2020-03-30 DIAGNOSIS — I10 Essential (primary) hypertension: Secondary | ICD-10-CM

## 2020-03-30 NOTE — Telephone Encounter (Signed)
Request denied. Patient needs to schedule OV per last refill provided.

## 2020-04-25 ENCOUNTER — Other Ambulatory Visit: Payer: Self-pay | Admitting: Family Medicine

## 2020-04-25 DIAGNOSIS — I10 Essential (primary) hypertension: Secondary | ICD-10-CM

## 2020-04-25 NOTE — Telephone Encounter (Signed)
Requested medication (s) are due for refill today: yes  Requested medication (s) are on the active medication list: yes  Last refill:  yes  Future visit scheduled: 03/16/20  Notes to clinic:  needs appt   Requested Prescriptions  Pending Prescriptions Disp Refills   amLODipine (NORVASC) 10 MG tablet [Pharmacy Med Name: AMLODIPINE BESYLATE 10 MG TAB] 30 tablet 0    Sig: TAKE 1 TABLET BY MOUTH EVERY DAY      Cardiovascular:  Calcium Channel Blockers Failed - 04/25/2020 12:30 PM      Failed - Last BP in normal range    BP Readings from Last 1 Encounters:  03/13/20 (!) 150/100          Failed - Valid encounter within last 6 months    Recent Outpatient Visits           7 months ago Positive PPD   [facility], MD   7 months ago Syncope, unspecified syncope type   [facility], Ranell Patrick, MD   1 year ago Cough   [facility], Ranell Patrick, MD   1 year ago Cough   [facility], Ranell Patrick, MD   2 years ago Heat sensitivity   [facility], PA-C

## 2020-05-11 ENCOUNTER — Other Ambulatory Visit: Payer: Self-pay | Admitting: Family Medicine

## 2020-05-11 DIAGNOSIS — I10 Essential (primary) hypertension: Secondary | ICD-10-CM

## 2020-05-11 NOTE — Telephone Encounter (Signed)
Requested medication (s) are due for refill today: yes  Requested medication (s) are on the active medication list: yes  Last refill: 04/25/20  #30  0 refills Courtesy refill  Future visit scheduled: Transferred to office for scheduling  Notes to clinic:  Patient has received courtesy refill. I call the patient and transferred to office for scheduling of appointment needed for refills. Please review request.    Requested Prescriptions  Pending Prescriptions Disp Refills   amLODipine (NORVASC) 10 MG tablet [Pharmacy Med Name: AMLODIPINE BESYLATE 10 MG TAB] 90 tablet 1    Sig: TAKE 1 TABLET BY MOUTH EVERY DAY      Cardiovascular:  Calcium Channel Blockers Failed - 05/11/2020 11:40 AM      Failed - Last BP in normal range    BP Readings from Last 1 Encounters:  03/13/20 (!) 150/100          Failed - Valid encounter within last 6 months    Recent Outpatient Visits           8 months ago Positive PPD   [facility], Ranell Patrick, MD   8 months ago Syncope, unspecified syncope type   [facility], Ranell Patrick, MD   1 year ago Cough   [facility], Ranell Patrick, MD   1 year ago Cough   [facility], Ranell Patrick, MD   2 years ago Heat sensitivity   [facility], PA-C

## 2020-05-25 ENCOUNTER — Encounter: Payer: Self-pay | Admitting: Family Medicine

## 2020-05-25 ENCOUNTER — Other Ambulatory Visit: Payer: Self-pay

## 2020-05-25 ENCOUNTER — Ambulatory Visit: Payer: 59 | Admitting: Family Medicine

## 2020-05-25 VITALS — BP 180/100 | HR 66 | Temp 98.3°F | Ht 66.0 in | Wt 224.0 lb

## 2020-05-25 DIAGNOSIS — I10 Essential (primary) hypertension: Secondary | ICD-10-CM | POA: Diagnosis not present

## 2020-05-25 DIAGNOSIS — R7303 Prediabetes: Secondary | ICD-10-CM

## 2020-05-25 DIAGNOSIS — Z1159 Encounter for screening for other viral diseases: Secondary | ICD-10-CM | POA: Diagnosis not present

## 2020-05-25 DIAGNOSIS — Z114 Encounter for screening for human immunodeficiency virus [HIV]: Secondary | ICD-10-CM

## 2020-05-25 MED ORDER — HYDROCHLOROTHIAZIDE 12.5 MG PO CAPS: 12.5000 mg | ORAL_CAPSULE | Freq: Every day | ORAL | 1 refills | Status: DC

## 2020-05-25 MED ORDER — AMLODIPINE BESYLATE 10 MG PO TABS: 10.0000 mg | ORAL_TABLET | Freq: Every day | ORAL | 1 refills | Status: DC

## 2020-05-25 NOTE — Patient Instructions (Addendum)
Continue amlodipine once per day, add hydrochlorothiazide pill once per day for improved blood pressure control.  Recheck in 1 month.  Return to the clinic or go to the nearest emergency room if any of your symptoms worsen or new symptoms occur.   Managing Your Hypertension Hypertension is commonly called high blood pressure. This is when the force of your blood pressing against the walls of your arteries is too strong. Arteries are blood vessels that carry blood from your heart throughout your body. Hypertension forces the heart to work harder to pump blood, and may cause the arteries to become narrow or stiff. Having untreated or uncontrolled hypertension can cause heart attack, stroke, kidney disease, and other problems. What are blood pressure readings? A blood pressure reading consists of a higher number over a lower number. Ideally, your blood pressure should be below 120/80. The first ("top") number is called the systolic pressure. It is a measure of the pressure in your arteries as your heart beats. The second ("bottom") number is called the diastolic pressure. It is a measure of the pressure in your arteries as the heart relaxes. What does my blood pressure reading mean? Blood pressure is classified into four stages. Based on your blood pressure reading, your health care provider may use the following stages to determine what type of treatment you need, if any. Systolic pressure and diastolic pressure are measured in a unit called mm Hg. Normal  Systolic pressure: below 629.  Diastolic pressure: below 80. Elevated  Systolic pressure: 528-413.  Diastolic pressure: below 80. Hypertension stage 1  Systolic pressure: 244-010.  Diastolic pressure: 27-25. Hypertension stage 2  Systolic pressure: 366 or above.  Diastolic pressure: 90 or above. What health risks are associated with hypertension? Managing your hypertension is an important responsibility. Uncontrolled hypertension can  lead to:  A heart attack.  A stroke.  A weakened blood vessel (aneurysm).  Heart failure.  Kidney damage.  Eye damage.  Metabolic syndrome.  Memory and concentration problems. What changes can I make to manage my hypertension? Hypertension can be managed by making lifestyle changes and possibly by taking medicines. Your health care provider will help you make a plan to bring your blood pressure within a normal range. Eating and drinking   Eat a diet that is high in fiber and potassium, and low in salt (sodium), added sugar, and fat. An example eating plan is called the DASH (Dietary Approaches to Stop Hypertension) diet. To eat this way: ? Eat plenty of fresh fruits and vegetables. Try to fill half of your plate at each meal with fruits and vegetables. ? Eat whole grains, such as whole wheat pasta, brown rice, or whole grain bread. Fill about one quarter of your plate with whole grains. ? Eat low-fat diary products. ? Avoid fatty cuts of meat, processed or cured meats, and poultry with skin. Fill about one quarter of your plate with lean proteins such as fish, chicken without skin, beans, eggs, and tofu. ? Avoid premade and processed foods. These tend to be higher in sodium, added sugar, and fat.  Reduce your daily sodium intake. Most people with hypertension should eat less than 1,500 mg of sodium a day.  Limit alcohol intake to no more than 1 drink a day for nonpregnant women and 2 drinks a day for men. One drink equals 12 oz of beer, 5 oz of wine, or 1 oz of hard liquor. Lifestyle  Work with your health care provider to maintain a healthy body  weight, or to lose weight. Ask what an ideal weight is for you.  Get at least 30 minutes of exercise that causes your heart to beat faster (aerobic exercise) most days of the week. Activities may include walking, swimming, or biking.  Include exercise to strengthen your muscles (resistance exercise), such as weight lifting, as part of  your weekly exercise routine. Try to do these types of exercises for 30 minutes at least 3 days a week.  Do not use any products that contain nicotine or tobacco, such as cigarettes and e-cigarettes. If you need help quitting, ask your health care provider.  Control any long-term (chronic) conditions you have, such as high cholesterol or diabetes. Monitoring  Monitor your blood pressure at home as told by your health care provider. Your personal target blood pressure may vary depending on your medical conditions, your age, and other factors.  Have your blood pressure checked regularly, as often as told by your health care provider. Working with your health care provider  Review all the medicines you take with your health care provider because there may be side effects or interactions.  Talk with your health care provider about your diet, exercise habits, and other lifestyle factors that may be contributing to hypertension.  Visit your health care provider regularly. Your health care provider can help you create and adjust your plan for managing hypertension. Will I need medicine to control my blood pressure? Your health care provider may prescribe medicine if lifestyle changes are not enough to get your blood pressure under control, and if:  Your systolic blood pressure is 130 or higher.  Your diastolic blood pressure is 80 or higher. Take medicines only as told by your health care provider. Follow the directions carefully. Blood pressure medicines must be taken as prescribed. The medicine does not work as well when you skip doses. Skipping doses also puts you at risk for problems. Contact a health care provider if:  You think you are having a reaction to medicines you have taken.  You have repeated (recurrent) headaches.  You feel dizzy.  You have swelling in your ankles.  You have trouble with your vision. Get help right away if:  You develop a severe headache or confusion.   You have unusual weakness or numbness, or you feel faint.  You have severe pain in your chest or abdomen.  You vomit repeatedly.  You have trouble breathing. Summary  Hypertension is when the force of blood pumping through your arteries is too strong. If this condition is not controlled, it may put you at risk for serious complications.  Your personal target blood pressure may vary depending on your medical conditions, your age, and other factors. For most people, a normal blood pressure is less than 120/80.  Hypertension is managed by lifestyle changes, medicines, or both. Lifestyle changes include weight loss, eating a healthy, low-sodium diet, exercising more, and limiting alcohol. This information is not intended to replace advice given to you by your health care provider. Make sure you discuss any questions you have with your health care provider. Document Revised: 09/28/2018 Document Reviewed: 05/04/2016 Elsevier Patient Education  [employer].     If you have lab work done today you will be contacted with your lab results within the next 2 weeks.  If you have not heard from [place] then please contact us. The fastest way to get your results is to register for My Chart.   IF you received an x-ray today, you  will receive an invoice from [facility]. Please contact [facility] at [phone] with questions or concerns regarding your invoice.   IF you received labwork today, you will receive an invoice from [facility]. Please contact [facility] at [phone] with questions or concerns regarding your invoice.   Our billing staff will not be able to assist you with questions regarding bills from these companies.  You will be contacted with the lab results as soon as they are available. The fastest way to get your results is to activate your My Chart account. Instructions are located on the last page of this paperwork. If you have not heard from [place] regarding the  results in 2 weeks, please contact this office.

## 2020-05-25 NOTE — Progress Notes (Signed)
Subjective:  Patient ID: Audrey Avery, female    DOB: 1971/04/27  Age: 49 y.o. MRN: [id number]  CC:  Chief Complaint  Patient presents with  . Medication Refill    Pt needs a refill on her Amlodipine. PT reports she isn't sure if the medication is working due to constant high readings at home. PT reports no side effects with this medication. Pt is fasting currently.    HPI Audrey Avery presents for   . Hypertension: Amlodipine 10 mg daily.  Blood pressure was stable in May, elevated at pulmonary visit September 24 for review of CPAP/OSA.  Excellent adherence with no leak and minimal AHI on evaluation at that time.  Plan for 1 year follow-up for her OSA. Prior syncope -evaluated by cardiology in May.  Short burst of SVT, but not thought to be cause of syncope, as only lasting 4-6 beats.  Longest episode 19 beats on previous monitoring.  Option of recorder if recurrent symptoms.  Echo in April with EF 60 to 65%, mild LVH.  Normal valves.  October 07, 2019 carotid Dopplers were normal with minimal plaque.  Still using cpap nightly and amlodipine daily. No missed doses.   No further syncope, feeling ok.  Home readings: elevated at home 160-180/95-106.  BP Readings from Last 3 Encounters:  05/25/20 (!) 180/100  03/13/20 (!) 150/100  11/11/19 128/80   Lab Results  Component Value Date   CREATININE 0.57 03/25/2019   Prediabetes A1c barely at diabetic level in October last year.  No recent testing.  Weight has decreased since earlier this year. Watching diet/portion control. Walking on treadmill. No nocturia.   Lab Results  Component Value Date   HGBA1C 6.5 (H) 03/25/2019  weight 247 in 03/2019.    Wt Readings from Last 3 Encounters:  05/25/20 224 lb (101.6 kg)  03/13/20 231 lb 6.4 oz (105 kg)  11/11/19 234 lb 12.8 oz (106.5 kg)   Lab Results  Component Value Date   CHOL 170 12/28/2017   HDL 52 12/28/2017   LDLCALC 105 (H) 12/28/2017   TRIG 67 12/28/2017    CHOLHDL 3.3 12/28/2017      History Patient Active Problem List   Diagnosis Date Noted  . Essential hypertension 10/02/2019  . Syncope and collapse 09/30/2019  . DOE (dyspnea on exertion) 09/30/2019  . OSA (obstructive sleep apnea) 08/02/2019  . Anemia, unspecified 02/24/2013   Past Medical History:  Diagnosis Date  . Anemia   . Chronic cough 05/2018   With intermittent cough related syncope  . Hypertension   . Morbid obesity with BMI of 40.0-44.9, adult ([place])   . Obstructive sleep apnea of adult 2021   Recently diagnosed.  AHI 127.8, O2 nadir 51% (severe OSA) CPAP titration pending  . PSVT (paroxysmal supraventricular tachycardia) ([place]) 09/2019   Cardiac monitor showed 62 episodes of short bursts of SVT ranging from 4-19 beats.  . Syncope, tussive    Past Surgical History:  Procedure Laterality Date  . ABDOMINAL HYSTERECTOMY    . BREAST SURGERY    . CESAREAN SECTION    . CHOLECYSTECTOMY    . TRANSTHORACIC ECHOCARDIOGRAM  09/2019   EF 60 to 65%.  Mild LVH,  normal valves..  Normal echo   Allergies  Allergen Reactions  . Penicillins Anaphylaxis and Swelling    Has patient had a PCN reaction causing immediate rash, facial/tongue/throat swelling, SOB or lightheadedness with hypotension: Yes Has patient had a PCN reaction causing severe rash involving mucus membranes  or skin necrosis: No Has patient had a PCN reaction that required hospitalization No Has patient had a PCN reaction occurring within the last 10 years: No If all of the above answers are "NO", then may proceed with Cephalosporin use.    Prior to Admission medications   Medication Sig Start Date End Date Taking? Authorizing Provider  amLODipine (NORVASC) 10 MG tablet TAKE 1 TABLET BY MOUTH EVERY DAY 05/11/20   Wendie Agreste, MD  fluticasone [facility]) 50 MCG/ACT nasal spray Place 1 spray into both nostrils daily. 07/19/19   Spero Geralds, MD   Social History   Socioeconomic History  . Marital status:  Single    Spouse name: Not on file  . Number of children: Not on file  . Years of education: Not on file  . Highest education level: Not on file  Occupational History    Employer: [employer]  Tobacco Use  . Smoking status: Never Smoker  . Smokeless tobacco: Never Used  Vaping Use  . Vaping Use: Never used  Substance and Sexual Activity  . Alcohol use: No  . Drug use: No  . Sexual activity: Not on file  Other Topics Concern  . Not on file  Social History Narrative  . Not on file   Social Determinants of Health   Financial Resource Strain:   . Difficulty of Paying Living Expenses: Not on file  Food Insecurity:   . Worried About Charity fundraiser in the Last Year: Not on file  . Ran Out of Food in the Last Year: Not on file  Transportation Needs:   . Lack of Transportation (Medical): Not on file  . Lack of Transportation (Non-Medical): Not on file  Physical Activity:   . Days of Exercise per Week: Not on file  . Minutes of Exercise per Session: Not on file  Stress:   . Feeling of Stress : Not on file  Social Connections:   . Frequency of Communication with Friends and Family: Not on file  . Frequency of Social Gatherings with Friends and Family: Not on file  . Attends Religious Services: Not on file  . Active Member of Clubs or Organizations: Not on file  . Attends Archivist Meetings: Not on file  . Marital Status: Not on file  Intimate Partner Violence:   . Fear of Current or Ex-Partner: Not on file  . Emotionally Abused: Not on file  . Physically Abused: Not on file  . Sexually Abused: Not on file    Review of Systems  Constitutional: Negative for fatigue and unexpected weight change.  Respiratory: Negative for chest tightness and shortness of breath.   Cardiovascular: Negative for chest pain, palpitations and leg swelling.  Gastrointestinal: Negative for abdominal pain and blood in stool.  Neurological: Negative for dizziness, syncope,  light-headedness and headaches.     Objective:   Vitals:   05/25/20 1325 05/25/20 1331  BP: (!) 183/109 (!) 180/100  Pulse: 66   Temp: 98.3 F (36.8 C)   TempSrc: Temporal   Weight: 224 lb (101.6 kg)   Height: 5\' 6"  (1.676 m)      Physical Exam Vitals reviewed.  Constitutional:      Appearance: She is well-developed.  HENT:     Head: Normocephalic and atraumatic.  Eyes:     Conjunctiva/sclera: Conjunctivae normal.     Pupils: Pupils are equal, round, and reactive to light.  Neck:     Vascular: No carotid bruit.  Cardiovascular:     Rate and Rhythm: Normal rate and regular rhythm.     Heart sounds: Normal heart sounds.  Pulmonary:     Effort: Pulmonary effort is normal.     Breath sounds: Normal breath sounds.  Abdominal:     Palpations: Abdomen is soft. There is no pulsatile mass.     Tenderness: There is no abdominal tenderness.  Skin:    General: Skin is warm and dry.  Neurological:     Mental Status: She is alert and oriented to person, place, and time.  Psychiatric:        Behavior: Behavior normal.        Assessment & Plan:  Audrey Avery is a 49 y.o. female . Essential hypertension - Plan: amLODipine (NORVASC) 10 MG tablet, hydrochlorothiazide (MICROZIDE) 12.5 MG capsule, Comprehensive metabolic panel, Lipid panel  -Decreased control but asymptomatic.  Add HCTZ 12.5 mg daily, continue amlodipine 10 mg.  Recheck 1 month.  Labs pending.  RTC/ER precautions.  Screening for HIV without presence of risk factors - Plan: HIV antibody (with reflex)  Encounter for hepatitis C screening test for low risk patient - Plan: Hepatitis C antibody  Prediabetes - Plan: Hemoglobin A1c, Lipid panel  -Anticipate significant improved A1c with weight loss.  Recheck 1 month.  Meds ordered this encounter  Medications  . amLODipine (NORVASC) 10 MG tablet    Sig: Take 1 tablet (10 mg total) by mouth daily.    Dispense:  90 tablet    Refill:  1  . hydrochlorothiazide  (MICROZIDE) 12.5 MG capsule    Sig: Take 1 capsule (12.5 mg total) by mouth daily.    Dispense:  30 capsule    Refill:  1   Patient Instructions   Continue amlodipine once per day, add hydrochlorothiazide pill once per day for improved blood pressure control.  Recheck in 1 month.  Return to the clinic or go to the nearest emergency room if any of your symptoms worsen or new symptoms occur.   Managing Your Hypertension Hypertension is commonly called high blood pressure. This is when the force of your blood pressing against the walls of your arteries is too strong. Arteries are blood vessels that carry blood from your heart throughout your body. Hypertension forces the heart to work harder to pump blood, and may cause the arteries to become narrow or stiff. Having untreated or uncontrolled hypertension can cause heart attack, stroke, kidney disease, and other problems. What are blood pressure readings? A blood pressure reading consists of a higher number over a lower number. Ideally, your blood pressure should be below 120/80. The first ("top") number is called the systolic pressure. It is a measure of the pressure in your arteries as your heart beats. The second ("bottom") number is called the diastolic pressure. It is a measure of the pressure in your arteries as the heart relaxes. What does my blood pressure reading mean? Blood pressure is classified into four stages. Based on your blood pressure reading, your health care provider may use the following stages to determine what type of treatment you need, if any. Systolic pressure and diastolic pressure are measured in a unit called mm Hg. Normal  Systolic pressure: below 509.  Diastolic pressure: below 80. Elevated  Systolic pressure: 326-712.  Diastolic pressure: below 80. Hypertension stage 1  Systolic pressure: 458-099.  Diastolic pressure: 83-38. Hypertension stage 2  Systolic pressure: 250 or above.  Diastolic pressure: 90  or above. What health risks are  associated with hypertension? Managing your hypertension is an important responsibility. Uncontrolled hypertension can lead to:  A heart attack.  A stroke.  A weakened blood vessel (aneurysm).  Heart failure.  Kidney damage.  Eye damage.  Metabolic syndrome.  Memory and concentration problems. What changes can I make to manage my hypertension? Hypertension can be managed by making lifestyle changes and possibly by taking medicines. Your health care provider will help you make a plan to bring your blood pressure within a normal range. Eating and drinking   Eat a diet that is high in fiber and potassium, and low in salt (sodium), added sugar, and fat. An example eating plan is called the DASH (Dietary Approaches to Stop Hypertension) diet. To eat this way: ? Eat plenty of fresh fruits and vegetables. Try to fill half of your plate at each meal with fruits and vegetables. ? Eat whole grains, such as whole wheat pasta, brown rice, or whole grain bread. Fill about one quarter of your plate with whole grains. ? Eat low-fat diary products. ? Avoid fatty cuts of meat, processed or cured meats, and poultry with skin. Fill about one quarter of your plate with lean proteins such as fish, chicken without skin, beans, eggs, and tofu. ? Avoid premade and processed foods. These tend to be higher in sodium, added sugar, and fat.  Reduce your daily sodium intake. Most people with hypertension should eat less than 1,500 mg of sodium a day.  Limit alcohol intake to no more than 1 drink a day for nonpregnant women and 2 drinks a day for men. One drink equals 12 oz of beer, 5 oz of wine, or 1 oz of hard liquor. Lifestyle  Work with your health care provider to maintain a healthy body weight, or to lose weight. Ask what an ideal weight is for you.  Get at least 30 minutes of exercise that causes your heart to beat faster (aerobic exercise) most days of the week.  Activities may include walking, swimming, or biking.  Include exercise to strengthen your muscles (resistance exercise), such as weight lifting, as part of your weekly exercise routine. Try to do these types of exercises for 30 minutes at least 3 days a week.  Do not use any products that contain nicotine or tobacco, such as cigarettes and e-cigarettes. If you need help quitting, ask your health care provider.  Control any long-term (chronic) conditions you have, such as high cholesterol or diabetes. Monitoring  Monitor your blood pressure at home as told by your health care provider. Your personal target blood pressure may vary depending on your medical conditions, your age, and other factors.  Have your blood pressure checked regularly, as often as told by your health care provider. Working with your health care provider  Review all the medicines you take with your health care provider because there may be side effects or interactions.  Talk with your health care provider about your diet, exercise habits, and other lifestyle factors that may be contributing to hypertension.  Visit your health care provider regularly. Your health care provider can help you create and adjust your plan for managing hypertension. Will I need medicine to control my blood pressure? Your health care provider may prescribe medicine if lifestyle changes are not enough to get your blood pressure under control, and if:  Your systolic blood pressure is 130 or higher.  Your diastolic blood pressure is 80 or higher. Take medicines only as told by your health care provider. Follow  the directions carefully. Blood pressure medicines must be taken as prescribed. The medicine does not work as well when you skip doses. Skipping doses also puts you at risk for problems. Contact a health care provider if:  You think you are having a reaction to medicines you have taken.  You have repeated (recurrent) headaches.  You feel  dizzy.  You have swelling in your ankles.  You have trouble with your vision. Get help right away if:  You develop a severe headache or confusion.  You have unusual weakness or numbness, or you feel faint.  You have severe pain in your chest or abdomen.  You vomit repeatedly.  You have trouble breathing. Summary  Hypertension is when the force of blood pumping through your arteries is too strong. If this condition is not controlled, it may put you at risk for serious complications.  Your personal target blood pressure may vary depending on your medical conditions, your age, and other factors. For most people, a normal blood pressure is less than 120/80.  Hypertension is managed by lifestyle changes, medicines, or both. Lifestyle changes include weight loss, eating a healthy, low-sodium diet, exercising more, and limiting alcohol. This information is not intended to replace advice given to you by your health care provider. Make sure you discuss any questions you have with your health care provider. Document Revised: 09/28/2018 Document Reviewed: 05/04/2016 Elsevier Patient Education  [employer].     If you have lab work done today you will be contacted with your lab results within the next 2 weeks.  If you have not heard from [place] then please contact us. The fastest way to get your results is to register for My Chart.   IF you received an x-ray today, you will receive an invoice from [facility]. Please contact [facility] at [phone] with questions or concerns regarding your invoice.   IF you received labwork today, you will receive an invoice from [place]. Please contact [facility] at [phone] with questions or concerns regarding your invoice.   Our billing staff will not be able to assist you with questions regarding bills from these companies.  You will be contacted with the lab results as soon as they are available. The fastest way to get your  results is to activate your My Chart account. Instructions are located on the last page of this paperwork. If you have not heard from [place] regarding the results in 2 weeks, please contact this office.         Signed, Merri Ray, MD [facility]

## 2020-05-26 LAB — COMPREHENSIVE METABOLIC PANEL
ALT: 18 IU/L (ref 0–32)
AST: 18 IU/L (ref 0–40)
Albumin/Globulin Ratio: 0.9 — ABNORMAL LOW (ref 1.2–2.2)
Albumin: 3.7 g/dL — ABNORMAL LOW (ref 3.8–4.8)
Alkaline Phosphatase: 85 IU/L (ref 44–121)
BUN/Creatinine Ratio: 16 (ref 9–23)
BUN: 11 mg/dL (ref 6–24)
Bilirubin Total: 0.3 mg/dL (ref 0.0–1.2)
CO2: 25 mmol/L (ref 20–29)
Calcium: 9 mg/dL (ref 8.7–10.2)
Chloride: 102 mmol/L (ref 96–106)
Creatinine, Ser: 0.67 mg/dL (ref 0.57–1.00)
GFR calc Af Amer: 121 mL/min/{1.73_m2} (ref >59)
GFR calc non Af Amer: 105 mL/min/{1.73_m2} (ref >59)
Globulin, Total: 3.9 g/dL (ref 1.5–4.5)
Glucose: 90 mg/dL (ref 65–99)
Potassium: 3.9 mmol/L (ref 3.5–5.2)
Sodium: 140 mmol/L (ref 134–144)
Total Protein: 7.6 g/dL (ref 6.0–8.5)

## 2020-05-26 LAB — HEMOGLOBIN A1C
Est. average glucose Bld gHb Est-mCnc: 128 mg/dL
Hgb A1c MFr Bld: 6.1 % — ABNORMAL HIGH (ref 4.8–5.6)

## 2020-05-26 LAB — HEPATITIS C ANTIBODY: Hep C Virus Ab: <0.1 s/co ratio (ref 0.0–0.9)

## 2020-05-26 LAB — LIPID PANEL
Chol/HDL Ratio: 3.6 ratio (ref 0.0–4.4)
Cholesterol, Total: 175 mg/dL (ref 100–199)
HDL: 49 mg/dL (ref >39)
LDL Chol Calc (NIH): 111 mg/dL — ABNORMAL HIGH (ref 0–99)
Triglycerides: 79 mg/dL (ref 0–149)
VLDL Cholesterol Cal: 15 mg/dL (ref 5–40)

## 2020-05-26 LAB — HIV ANTIBODY (ROUTINE TESTING W REFLEX): HIV Screen 4th Generation wRfx: NONREACTIVE

## 2020-06-19 ENCOUNTER — Other Ambulatory Visit: Payer: Self-pay | Admitting: Family Medicine

## 2020-06-19 DIAGNOSIS — I10 Essential (primary) hypertension: Secondary | ICD-10-CM

## 2020-06-29 ENCOUNTER — Other Ambulatory Visit: Payer: Self-pay

## 2020-06-29 ENCOUNTER — Ambulatory Visit: Payer: 59 | Admitting: Family Medicine

## 2020-06-29 ENCOUNTER — Encounter: Payer: Self-pay | Admitting: Family Medicine

## 2020-06-29 VITALS — BP 150/96 | HR 75 | Temp 97.9°F | Ht 66.0 in | Wt 222.0 lb

## 2020-06-29 DIAGNOSIS — I10 Essential (primary) hypertension: Secondary | ICD-10-CM

## 2020-06-29 DIAGNOSIS — Z1329 Encounter for screening for other suspected endocrine disorder: Secondary | ICD-10-CM | POA: Diagnosis not present

## 2020-06-29 DIAGNOSIS — Z6835 Body mass index (BMI) 35.0-35.9, adult: Secondary | ICD-10-CM | POA: Diagnosis not present

## 2020-06-29 DIAGNOSIS — Z1211 Encounter for screening for malignant neoplasm of colon: Secondary | ICD-10-CM | POA: Diagnosis not present

## 2020-06-29 MED ORDER — HYDROCHLOROTHIAZIDE 25 MG PO TABS: 25.0000 mg | ORAL_TABLET | Freq: Every day | ORAL | 1 refills | Status: DC

## 2020-06-29 NOTE — Progress Notes (Addendum)
Subjective:  Patient ID: Audrey Avery, female    DOB: [birth year]  Age: 50 y.o. MRN: [id number]  CC:  Chief Complaint  Patient presents with  . Follow-up    On hypertension. Pt hasn't been watching BP at home states it always runs high. PT reports no physical symptoms oh hypertension since last OV. Pt reports no other issues to report at this time.    HPI Audrey Avery presents for   Hypertension:  Follow-up from December 6, elevated blood pressure at that time.  She was using her CPAP nightly and amlodipine 10 mg daily.  Previous cardiology evaluation for previous syncope with short bursts of SVT but not thought to be cause of syncope on her monitoring.  Option of loop recorder.  Echo in April with EF 60 to 65%, mild LVH.  Normal valves. Added HCTZ 12.5 mg daily at December visit. Home readings: 150/102. Still running high.  No new side effects.  No syncope/syncopal sx.  BP Readings from Last 3 Encounters:  06/29/20 (!) 150/96  05/25/20 (!) 180/100  03/13/20 (!) 150/100   Lab Results  Component Value Date   CREATININE 0.67 05/25/2020   Trying to lose weight - walking on treadmill 45 mins 3 days per night. Some difficulty with weight loss.  Watching portions, does drink soda once per day.  Fast food - none.  Rare take out. Cooks at home.   Agrees to referral for colonoscopy.  Lab Results  Component Value Date   TSH 1.590 09/20/2016   Depression screen [facility] 2/9 06/29/2020 05/25/2020 09/06/2019 03/25/2019 02/20/2019  Decreased Interest 0 0 0 0 0  Down, Depressed, Hopeless 0 0 0 0 0  PHQ - 2 Score 0 0 0 0 0  Altered sleeping - - - - -  Tired, decreased energy - - - - -  Change in appetite - - - - -  Feeling bad or failure about yourself  - - - - -  Trouble concentrating - - - - -  Moving slowly or fidgety/restless - - - - -  Suicidal thoughts - - - - -  PHQ-9 Score - - - - -  Difficult doing work/chores - - - - -     History Patient Active Problem List    Diagnosis Date Noted  . Essential hypertension 10/02/2019  . Syncope and collapse 09/30/2019  . DOE (dyspnea on exertion) 09/30/2019  . OSA (obstructive sleep apnea) 08/02/2019  . Anemia, unspecified 02/24/2013   Past Medical History:  Diagnosis Date  . Anemia   . Chronic cough 05/2018   With intermittent cough related syncope  . Hypertension   . Morbid obesity with BMI of 40.0-44.9, adult ([place])   . Obstructive sleep apnea of adult 2021   Recently diagnosed.  AHI 127.8, O2 nadir 51% (severe OSA) CPAP titration pending  . PSVT (paroxysmal supraventricular tachycardia) ([place]) 09/2019   Cardiac monitor showed 62 episodes of short bursts of SVT ranging from 4-19 beats.  . Syncope, tussive    Past Surgical History:  Procedure Laterality Date  . ABDOMINAL HYSTERECTOMY    . BREAST SURGERY    . CESAREAN SECTION    . CHOLECYSTECTOMY    . TRANSTHORACIC ECHOCARDIOGRAM  09/2019   EF 60 to 65%.  Mild LVH,  normal valves..  Normal echo   Allergies  Allergen Reactions  . Penicillins Anaphylaxis and Swelling    Has patient had a PCN reaction causing immediate rash, facial/tongue/throat swelling, SOB or lightheadedness  with hypotension: Yes Has patient had a PCN reaction causing severe rash involving mucus membranes or skin necrosis: No Has patient had a PCN reaction that required hospitalization No Has patient had a PCN reaction occurring within the last 10 years: No If all of the above answers are "NO", then may proceed with Cephalosporin use.    Prior to Admission medications   Medication Sig Start Date End Date Taking? Authorizing Provider  amLODipine (NORVASC) 10 MG tablet Take 1 tablet (10 mg total) by mouth daily. 05/25/20  Yes Wendie Agreste, MD  fluticasone (FLONASE) 50 MCG/ACT nasal spray Place 1 spray into both nostrils daily. 07/19/19  Yes Spero Geralds, MD  hydrochlorothiazide (MICROZIDE) 12.5 MG capsule TAKE 1 CAPSULE BY MOUTH EVERY DAY 06/22/20  Yes Wendie Agreste, MD    Social History   Socioeconomic History  . Marital status: Single    Spouse name: Not on file  . Number of children: Not on file  . Years of education: Not on file  . Highest education level: Not on file  Occupational History    Employer: [employer]  Tobacco Use  . Smoking status: Never Smoker  . Smokeless tobacco: Never Used  Vaping Use  . Vaping Use: Never used  Substance and Sexual Activity  . Alcohol use: No  . Drug use: No  . Sexual activity: Not on file  Other Topics Concern  . Not on file  Social History Narrative  . Not on file   Social Determinants of Health   Financial Resource Strain: Not on file  Food Insecurity: Not on file  Transportation Needs: Not on file  Physical Activity: Not on file  Stress: Not on file  Social Connections: Not on file  Intimate Partner Violence: Not on file    Review of Systems  Constitutional: Negative for fatigue and unexpected weight change.  Respiratory: Negative for chest tightness and shortness of breath.   Cardiovascular: Negative for chest pain, palpitations and leg swelling.  Gastrointestinal: Negative for abdominal pain and blood in stool.  Neurological: Negative for dizziness, syncope, light-headedness and headaches.     Objective:   Vitals:   06/29/20 1503 06/29/20 1510  BP: (!) 154/101 (!) 150/96  Pulse: 75   Temp: 97.9 F (36.6 C)   TempSrc: Temporal   SpO2: 94%   Weight: 222 lb (100.7 kg)   Height: 5\' 6"  (1.676 m)      Physical Exam Vitals reviewed.  Constitutional:      Appearance: She is well-developed and well-nourished.  HENT:     Head: Normocephalic and atraumatic.  Eyes:     Extraocular Movements: EOM normal.     Conjunctiva/sclera: Conjunctivae normal.     Pupils: Pupils are equal, round, and reactive to light.  Neck:     Vascular: No carotid bruit.  Cardiovascular:     Rate and Rhythm: Normal rate and regular rhythm.     Pulses: Intact distal pulses.     Heart sounds:  Normal heart sounds.  Pulmonary:     Effort: Pulmonary effort is normal.     Breath sounds: Normal breath sounds.  Abdominal:     Palpations: Abdomen is soft. There is no pulsatile mass.     Tenderness: There is no abdominal tenderness.  Skin:    General: Skin is warm and dry.  Neurological:     Mental Status: She is alert and oriented to person, place, and time.  Psychiatric:  Mood and Affect: Mood and affect normal.        Behavior: Behavior normal.        Assessment & Plan:  Audrey Avery is a 50 y.o. female . Essential hypertension - Plan: Basic metabolic panel, TSH, hydrochlorothiazide (HYDRODIURIL) 25 MG tablet  -Improved but still uncontrolled.  Check TSH, BMP.  Increase HCTZ to 25 mg daily, can double up on her current 12.5's for now.  No side effects discussed, RTC precautions, 6-week follow-up  Special screening for malignant neoplasms, colon - Plan: Ambulatory referral to Gastroenterology  Screening for thyroid disorder - Plan: TSH  BMI 35.0-35.9,adult  -Commended on exercise and diet/portion control.  Avoidance of sugar containing beverages discussed.  Would not recommend any specific supplements or weight loss medications for now given hypertension.  6-week follow-up  Meds ordered this encounter  Medications  . hydrochlorothiazide (HYDRODIURIL) 25 MG tablet    Sig: Take 1 tablet (25 mg total) by mouth daily.    Dispense:  90 tablet    Refill:  1    Okay to place on hold if needed.  New dose.   Patient Instructions     Continue amlodipine 10 mg/day.  Increase hydrochlorothiazide to a total dose of 25 mg/day or 2 of the HCTZ 12.5 mg.  I did send the 25 mg dose to your pharmacy if that dose is tolerated.  Watch for lightheadedness or dizziness at higher dose and let me know if that occurs.  Recheck in 6 weeks.  Keep up the good work with low intensity exercise, and diet/watching portions.  Try to cut back on sodas or other sugar-containing beverages.   I will check thyroid test as well as electrolytes today, recheck 6 weeks.  Let me know if there are questions sooner  If you have lab work done today you will be contacted with your lab results within the next 2 weeks.  If you have not heard from [place] then please contact us. The fastest way to get your results is to register for My Chart.   IF you received an x-ray today, you will receive an invoice from [facility]. Please contact [facility] at [phone] with questions or concerns regarding your invoice.   IF you received labwork today, you will receive an invoice from [place]. Please contact [facility] at [phone] with questions or concerns regarding your invoice.   Our billing staff will not be able to assist you with questions regarding bills from these companies.  You will be contacted with the lab results as soon as they are available. The fastest way to get your results is to activate your My Chart account. Instructions are located on the last page of this paperwork. If you have not heard from [place] regarding the results in 2 weeks, please contact this office.         Signed, Merri Ray, MD [facility]

## 2020-06-29 NOTE — Patient Instructions (Addendum)
   Continue amlodipine 10 mg/day.  Increase hydrochlorothiazide to a total dose of 25 mg/day or 2 of the HCTZ 12.5 mg.  I did send the 25 mg dose to your pharmacy if that dose is tolerated.  Watch for lightheadedness or dizziness at higher dose and let me know if that occurs.  Recheck in 6 weeks.  Keep up the good work with low intensity exercise, and diet/watching portions.  Try to cut back on sodas or other sugar-containing beverages.  I will check thyroid test as well as electrolytes today, recheck 6 weeks.  Let me know if there are questions sooner  If you have lab work done today you will be contacted with your lab results within the next 2 weeks.  If you have not heard from [place] then please contact us. The fastest way to get your results is to register for My Chart.   IF you received an x-ray today, you will receive an invoice from [facility]. Please contact [facility] at [phone] with questions or concerns regarding your invoice.   IF you received labwork today, you will receive an invoice from [place]. Please contact [facility] at [phone] with questions or concerns regarding your invoice.   Our billing staff will not be able to assist you with questions regarding bills from these companies.  You will be contacted with the lab results as soon as they are available. The fastest way to get your results is to activate your My Chart account. Instructions are located on the last page of this paperwork. If you have not heard from [place] regarding the results in 2 weeks, please contact this office.

## 2020-06-30 LAB — BASIC METABOLIC PANEL
BUN/Creatinine Ratio: 19 (ref 9–23)
BUN: 12 mg/dL (ref 6–24)
CO2: 23 mmol/L (ref 20–29)
Calcium: 9.2 mg/dL (ref 8.7–10.2)
Chloride: 102 mmol/L (ref 96–106)
Creatinine, Ser: 0.64 mg/dL (ref 0.57–1.00)
GFR calc Af Amer: 123 mL/min/{1.73_m2} (ref >59)
GFR calc non Af Amer: 107 mL/min/{1.73_m2} (ref >59)
Glucose: 83 mg/dL (ref 65–99)
Potassium: 4.3 mmol/L (ref 3.5–5.2)
Sodium: 138 mmol/L (ref 134–144)

## 2020-06-30 LAB — TSH: TSH: 2.39 u[IU]/mL (ref 0.450–4.500)

## 2020-08-10 ENCOUNTER — Ambulatory Visit: Payer: 59 | Admitting: Family Medicine

## 2020-08-14 ENCOUNTER — Ambulatory Visit: Payer: 59 | Admitting: Family Medicine

## 2020-08-19 ENCOUNTER — Encounter (INDEPENDENT_AMBULATORY_CARE_PROVIDER_SITE_OTHER): Payer: Self-pay

## 2020-09-08 ENCOUNTER — Ambulatory Visit: Payer: 59 | Admitting: Adult Health

## 2020-09-08 ENCOUNTER — Telehealth: Payer: Self-pay | Admitting: Internal Medicine

## 2020-09-08 ENCOUNTER — Encounter: Payer: Self-pay | Admitting: Adult Health

## 2020-09-08 ENCOUNTER — Other Ambulatory Visit: Payer: Self-pay

## 2020-09-08 VITALS — BP 142/75 | HR 66 | Temp 98.1°F | Resp 20 | Ht 66.0 in | Wt 227.0 lb

## 2020-09-08 DIAGNOSIS — G4733 Obstructive sleep apnea (adult) (pediatric): Secondary | ICD-10-CM

## 2020-09-08 DIAGNOSIS — F458 Other somatoform disorders: Secondary | ICD-10-CM | POA: Diagnosis not present

## 2020-09-08 NOTE — Progress Notes (Signed)
@Patient  ID: Audrey Avery, female    DOB: [birth year], 50 y.o.   MRN: [id number]  Chief Complaint  Patient presents with  . Follow-up    Referring provider: Wendie Agreste, MD  HPI: 50 year old female followed for chronic cough and obstructive sleep apnea  TEST/EVENTS :  Sleep study August 01, 2019 showed severe sleep apnea with an AHI at 127.8 and SPO2 low at 51%  09/08/2020 Follow up : OSA  Patient returns for a follow-up visit.  Patient has underlying very severe sleep apnea is on nocturnal CPAP.  Patient complains of that she is swallowing a lot of air. After she takes off the CPAP feels a fullness in chest like she has extra air . Has some epigastric fullness. No increased heartburn or indigestion. No burping . Has some throat dryness that goes away after getting up  Patient has excellent CPAP compliance.  CPAP download shows excellent compliance with 97% usage.  Daily average usage at 6 hours.  Patient is on auto CPAP 5 to 20 cm H2O.  AHI is 1.4.  Minimal leaks.  Daily average pressure at 11.5 cm H2O.Marland Kitchen Using nasal pillows. Does not sleep without it  Feels better since starting to use it . Less sleepy. Does work shift work .   Had chronic cough in past but has resolved now .   Allergies  Allergen Reactions  . Penicillins Anaphylaxis and Swelling    Has patient had a PCN reaction causing immediate rash, facial/tongue/throat swelling, SOB or lightheadedness with hypotension: Yes Has patient had a PCN reaction causing severe rash involving mucus membranes or skin necrosis: No Has patient had a PCN reaction that required hospitalization No Has patient had a PCN reaction occurring within the last 10 years: No If all of the above answers are "NO", then may proceed with Cephalosporin use.     Immunization History  Administered Date(s) Administered  . PFIZER(Purple Top)SARS-COV-2 Vaccination 01/11/2020, 02/01/2020  . PPD Test 09/06/2019    Past Medical History:   Diagnosis Date  . Anemia   . Chronic cough 05/2018   With intermittent cough related syncope  . Hypertension   . Morbid obesity with BMI of 40.0-44.9, adult ([place])   . Obstructive sleep apnea of adult 2021   Recently diagnosed.  AHI 127.8, O2 nadir 51% (severe OSA) CPAP titration pending  . PSVT (paroxysmal supraventricular tachycardia) ([place]) 09/2019   Cardiac monitor showed 62 episodes of short bursts of SVT ranging from 4-19 beats.  . Syncope, tussive     Tobacco History: Social History   Tobacco Use  Smoking Status Never Smoker  Smokeless Tobacco Never Used   Counseling given: Not Answered   Outpatient Medications Prior to Visit  Medication Sig Dispense Refill  . amLODipine (NORVASC) 10 MG tablet Take 1 tablet (10 mg total) by mouth daily. 90 tablet 1  . fluticasone (FLONASE) 50 MCG/ACT nasal spray Place 1 spray into both nostrils daily. 16 g 5  . hydrochlorothiazide (HYDRODIURIL) 25 MG tablet Take 1 tablet (25 mg total) by mouth daily. 90 tablet 1   No facility-administered medications prior to visit.     Review of Systems:   Constitutional:   No  weight loss, night sweats,  Fevers, chills, fatigue, or  lassitude.  HEENT:   No headaches,  Difficulty swallowing,  Tooth/dental problems, or  Sore throat,                No sneezing, itching, ear ache, nasal congestion, post nasal  drip,   CV:  No chest pain,  Orthopnea, PND, swelling in lower extremities, anasarca, dizziness, palpitations, syncope.   GI  No heartburn, indigestion, abdominal pain, nausea, vomiting, diarrhea, change in bowel habits, loss of appetite, bloody stools.   Resp: No shortness of breath with exertion or at rest.  No excess mucus, no productive cough,  No non-productive cough,  No coughing up of blood.  No change in color of mucus.  No wheezing.  No chest wall deformity  Skin: no rash or lesions.  GU: no dysuria, change in color of urine, no urgency or frequency.  No flank pain, no hematuria    MS:  No joint pain or swelling.  No decreased range of motion.  No back pain.    Physical Exam  BP (!) 142/75 (BP Location: Left Arm, Patient Position: Sitting, Cuff Size: Normal)   Pulse 66   Temp 98.1 F (36.7 C) (Skin)   Resp 20   Ht 5\' 6"  (1.676 m)   Wt 227 lb (103 kg)   SpO2 96%   BMI 36.64 kg/m   GEN: A/Ox3; pleasant , NAD, +BMI 36    HEENT:  [place]/AT,    NOSE-clear, THROAT-clear, no lesions, no postnasal drip or exudate noted. Class 3-4 MP airway   NECK:  Supple w/ fair ROM; no JVD; normal carotid impulses w/o bruits; no thyromegaly or nodules palpated; no lymphadenopathy.    RESP  Clear  P & A; w/o, wheezes/ rales/ or rhonchi. no accessory muscle use, no dullness to percussion  CARD:  RRR, no m/r/g, no peripheral edema, pulses intact, no cyanosis or clubbing.  GI:   Soft & nt; nml bowel sounds; no organomegaly or masses detected.   Musco: Warm bil, no deformities or joint swelling noted.   Neuro: alert, no focal deficits noted.    Skin: Warm, no lesions or rashes    Lab Results:  CBC   BNP  Imaging: No results found.    No flowsheet data found.  No results found for: NITRICOXIDE      Assessment & Plan:   OSA (obstructive sleep apnea) Excellent control and compliance  ? Aerophagia with CPAP . Will decrease CPAP pressure to see if this helps.   Plan  Patient Instructions  Decrease CPAP pressure to 5 to 15 cm H2O. Continue on CPAP at bedtime Keep up the good work Work on [employer] Do not drive if sleepy Saline nasal spray Twice daily   Saline gel At bedtime   Follow-up in 6 months with Dr. Shearon Stalls and As needed        Aerophagia ?Aerophagia from CPAP , Uses nasal pillows. - will decrease CPAP pressure to see if this alleviates this  If not will need further evaluation , advised to call back if not improving   Plan  Patient Instructions  Decrease CPAP pressure to 5 to 15 cm H2O. Continue on CPAP at bedtime Keep up the good  work Work on [employer] Do not drive if sleepy Saline nasal spray Twice daily   Saline gel At bedtime   Follow-up in 6 months with Dr. Shearon Stalls and As needed           Rexene Edison, NP 09/08/2020

## 2020-09-08 NOTE — Assessment & Plan Note (Signed)
Excellent control and compliance  ? Aerophagia with CPAP . Will decrease CPAP pressure to see if this helps.   Plan  Patient Instructions  Decrease CPAP pressure to 5 to 15 cm H2O. Continue on CPAP at bedtime Keep up the good work Work on [employer] Do not drive if sleepy Saline nasal spray Twice daily   Saline gel At bedtime   Follow-up in 6 months with Dr. Shearon Stalls and As needed

## 2020-09-08 NOTE — Patient Instructions (Addendum)
Decrease CPAP pressure to 5 to 15 cm H2O. Continue on CPAP at bedtime Keep up the good work Work on [employer] Do not drive if sleepy Saline nasal spray Twice daily   Saline gel At bedtime   Follow-up in 6 months with Dr. Shearon Stalls and As needed

## 2020-09-08 NOTE — Telephone Encounter (Signed)
Called and spoke with pt and she stated that she does not have any pain or cough or any other sick symptoms.  She stated that she has been using the cpap since April and been doing well.  She stated that for about a month she has been having this weird feeling in her chest.  She stated that it feels like when you are trying to get bronchitis.  She is very concerned about this and wanted to be checked.  She is going to be seen at 930 today.

## 2020-09-08 NOTE — Assessment & Plan Note (Addendum)
?  Aerophagia from CPAP , Uses nasal pillows. - will decrease CPAP pressure to see if this alleviates this  If not will need further evaluation , advised to call back if not improving   Plan  Patient Instructions  Decrease CPAP pressure to 5 to 15 cm H2O. Continue on CPAP at bedtime Keep up the good work Work on [employer] Do not drive if sleepy Saline nasal spray Twice daily   Saline gel At bedtime   Follow-up in 6 months with Dr. Shearon Stalls and As needed

## 2021-01-01 ENCOUNTER — Other Ambulatory Visit: Payer: Self-pay | Admitting: Family Medicine

## 2021-01-01 DIAGNOSIS — I10 Essential (primary) hypertension: Secondary | ICD-10-CM

## 2021-01-29 ENCOUNTER — Other Ambulatory Visit: Payer: Self-pay | Admitting: Family Medicine

## 2021-01-29 DIAGNOSIS — I10 Essential (primary) hypertension: Secondary | ICD-10-CM

## 2021-03-04 ENCOUNTER — Ambulatory Visit: Payer: 59 | Admitting: Family

## 2021-03-11 NOTE — Progress Notes (Signed)
Subjective:    Audrey Avery - 50 y.o. female MRN [id number]  Date of birth: June 25, 1970  HPI  Audrey Avery is to establish care.  Current issues and/or concerns: HYPERTENSION FOLLOW-UP: Currently taking: see medication list Have you taken your blood pressure medication today: []  Yes [x]  No  Med Adherence: [x]  Yes    []  No Medication side effects: []  Yes    [x]  No Adherence with salt restriction (low-salt diet): [x]  Yes    []  No Exercise: Yes [x]  No []  Home Monitoring?: []  Yes    [x]  No Monitoring Frequency: []  Yes    [x]  No Home BP results range: []  Yes    [x]  No Smoking [x]  Yes, occasionally  SOB? []  Yes    [x]  No Chest Pain?: []  Yes    [x]  No Comments: Reports blood pressures have historically been higher than normal.   2. STD SCREENING: Reports recent encounter of unprotected intercourse. Denies any vaginal symptoms.    ROS per HPI    Health Maintenance:  Health Maintenance Due  Topic Date Due   OPHTHALMOLOGY EXAM  Never done   TETANUS/TDAP  Never done   COLONOSCOPY (Pts 45-48yrs Insurance coverage will need to be confirmed)  Never done   COVID-19 Vaccine (3 - Booster for [place] series) 07/03/2020   URINE MICROALBUMIN  09/05/2020   HEMOGLOBIN A1C  11/23/2020     Past Medical History: Patient Active Problem List   Diagnosis Date Noted   Aerophagia 09/08/2020   Essential hypertension 10/02/2019   Syncope and collapse 09/30/2019   DOE (dyspnea on exertion) 09/30/2019   OSA (obstructive sleep apnea) 08/02/2019   Anemia 08/15/2012   Menorrhagia 08/15/2012   Refusal of blood transfusions as patient is Jehovah's Witness 08/15/2012   Uterine fibroid 08/15/2012      Social History   reports that she has never smoked. She has never used smokeless tobacco. She reports that she does not drink alcohol and does not use drugs.   Family History  family history includes CAD (age of onset: 29) in her father; Cancer in her father, mother, and sister; Diabetes  in her father; Heart attack (age of onset: 63) in her father; Hypertension in her father, mother, and sister.   Medications: reviewed and updated   Objective:   Physical Exam BP (!) 168/110 (BP Location: Left Arm, Patient Position: Sitting, Cuff Size: Large)   Pulse 75   Temp 97.7 F (36.5 C)   Resp 16   Ht 5' 5.98" (1.676 m)   Wt 216 lb 3.2 oz (98.1 kg)   SpO2 96%   BMI 34.91 kg/m   Physical Exam HENT:     Head: Normocephalic and atraumatic.  Eyes:     Extraocular Movements: Extraocular movements intact.     Conjunctiva/sclera: Conjunctivae normal.     Pupils: Pupils are equal, round, and reactive to light.  Cardiovascular:     Rate and Rhythm: Normal rate and regular rhythm.     Pulses: Normal pulses.     Heart sounds: Normal heart sounds.  Pulmonary:     Effort: Pulmonary effort is normal.     Breath sounds: Normal breath sounds.  Musculoskeletal:     Cervical back: Normal range of motion and neck supple.  Neurological:     General: No focal deficit present.     Mental Status: She is alert and oriented to person, place, and time.  Psychiatric:        Mood and Affect: Mood normal.  Behavior: Behavior normal.       Assessment & Plan:  1. Encounter to establish care: - Patient presents today to establish care.  - Return for annual physical examination, labs, and health maintenance. Arrive fasting meaning having no food for at least 8 hours prior to appointment. You may have only water or black coffee. Please take scheduled medications as normal.  2. Essential hypertension: - Blood pressure not at goal during today's visit. Patient asymptomatic without chest pressure, chest pain, palpitations, shortness of breath, worst headache of life, and any additional red flag symptoms. - Continue Amlodipine as prescribed.  - Increase Hydrochlorothiazide from 25 mg daily to 50 mg daily.  - Counseled on blood pressure goal of less than 130/80, low-sodium, DASH diet,  medication compliance, 150 minutes of moderate intensity exercise per week as tolerated. Discussed medication compliance, adverse effects. - Follow-up with primary provider in 2 weeks or sooner if needed.  - amLODipine (NORVASC) 10 MG tablet; Take 1 tablet (10 mg total) by mouth daily.  Dispense: 90 tablet; Refill: 0 - hydrochlorothiazide (HYDRODIURIL) 50 MG tablet; Take 1 tablet (50 mg total) by mouth daily.  Dispense: 90 tablet; Refill: 0  3. Routine screening for STI (sexually transmitted infection): - Urinalysis negative for urinary tract infection.  - Cervicovaginal self-swab to screen for chlamydia, gonorrhea, trichomonas, bacterial vaginitis, and candida vaginitis. - Cervicovaginal ancillary only - POCT URINALYSIS DIP (CLINITEK)  4. History of hysterectomy: - Patient reports complete hysterectomy in 2014. Reports she does have ovaries and fallopian tubes.      Patient was given clear instructions to go to Emergency Department or return to medical center if symptoms don't improve, worsen, or new problems develop.The patient verbalized understanding.  I discussed the assessment and treatment plan with the patient. The patient was provided an opportunity to ask questions and all were answered. The patient agreed with the plan and demonstrated an understanding of the instructions.   The patient was advised to call back or seek an in-person evaluation if the symptoms worsen or if the condition fails to improve as anticipated.    Durene Fruits, NP 03/15/2021, 10:41 AM Primary Care at [facility]

## 2021-03-15 ENCOUNTER — Other Ambulatory Visit: Payer: Self-pay

## 2021-03-15 ENCOUNTER — Other Ambulatory Visit (HOSPITAL_COMMUNITY)
Admission: RE | Admit: 2021-03-15 | Discharge: 2021-03-15 | Disposition: A | Discharge status: Home / Self Care | Payer: 59 | Source: Ambulatory Visit | Attending: Family | Admitting: Family

## 2021-03-15 ENCOUNTER — Ambulatory Visit: Payer: 59 | Admitting: Family

## 2021-03-15 ENCOUNTER — Encounter: Payer: Self-pay | Admitting: Family

## 2021-03-15 VITALS — BP 168/110 | HR 75 | Temp 97.7°F | Resp 16 | Ht 65.98 in | Wt 216.2 lb

## 2021-03-15 DIAGNOSIS — Z113 Encounter for screening for infections with a predominantly sexual mode of transmission: Secondary | ICD-10-CM | POA: Insufficient documentation

## 2021-03-15 DIAGNOSIS — Z7689 Persons encountering health services in other specified circumstances: Secondary | ICD-10-CM

## 2021-03-15 DIAGNOSIS — Z90710 Acquired absence of both cervix and uterus: Secondary | ICD-10-CM

## 2021-03-15 DIAGNOSIS — I10 Essential (primary) hypertension: Secondary | ICD-10-CM

## 2021-03-15 LAB — POCT URINALYSIS DIP (CLINITEK)
Bilirubin, UA: NEGATIVE
Blood, UA: NEGATIVE
Glucose, UA: NEGATIVE mg/dL
Ketones, POC UA: NEGATIVE mg/dL
Leukocytes, UA: NEGATIVE
Nitrite, UA: NEGATIVE
Spec Grav, UA: >=1.03 — AB (ref 1.010–1.025)
Urobilinogen, UA: 0.2 E.U./dL
pH, UA: 6 (ref 5.0–8.0)

## 2021-03-15 MED ORDER — AMLODIPINE BESYLATE 10 MG PO TABS: 10.0000 mg | ORAL_TABLET | Freq: Every day | ORAL | 0 refills | Status: DC

## 2021-03-15 MED ORDER — HYDROCHLOROTHIAZIDE 50 MG PO TABS: 50.0000 mg | ORAL_TABLET | Freq: Every day | ORAL | 0 refills | Status: DC

## 2021-03-15 NOTE — Patient Instructions (Signed)
Thank you for choosing Primary Care at [facility] for your medical home!    Audrey Avery was seen by Camillia Herter, NP today.   Audrey Avery's primary care provider is Abbygale Lapid Zachery Dauer, NP.   For the best care possible,  you should try to see Durene Fruits, NP whenever you come to clinic.   We look forward to seeing you again soon!  If you have any questions about your visit today,  please call us at [phone]  Or feel free to reach your provider via [place].    Keeping you healthy   Get these tests Blood pressure- Have your blood pressure checked once a year by your healthcare provider.  Normal blood pressure is 120/80. Weight- Have your body mass index (BMI) calculated to screen for obesity.  BMI is a measure of body fat based on height and weight. You can also calculate your own BMI at GravelBags.it. Cholesterol- Have your cholesterol checked regularly starting at age 36, sooner may be necessary if you have diabetes, high blood pressure, if a family member developed heart diseases at an early age or if you smoke.  Chlamydia, HIV, and other sexual transmitted disease- Get screened each year until the age of 47 then within three months of each new sexual partner. Diabetes- Have your blood sugar checked regularly if you have high blood pressure, high cholesterol, a family history of diabetes or if you are overweight.   Get these vaccines Flu shot- Every fall. Tetanus shot- Every 10 years. Menactra- Single dose; prevents meningitis.   Take these steps Don't smoke- If you do smoke, ask your healthcare provider about quitting. For tips on how to quit, go to www.smokefree.gov or call 1-800-QUIT-NOW. Be physically active- Exercise 5 days a week for at least 30 minutes.  If you are not already physically active start slow and gradually work up to 30 minutes of moderate physical activity.  Examples of moderate activity include walking briskly, mowing the yard,  dancing, swimming bicycling, etc. Eat a healthy diet- Eat a variety of healthy foods such as fruits, vegetables, low fat milk, low fat cheese, yogurt, lean meats, poultry, fish, beans, tofu, etc.  For more information on healthy eating, go to www.thenutritionsource.org Drink alcohol in moderation- Limit alcohol intake two drinks or less a day.  Never drink and drive. Dentist- Brush and floss teeth twice daily; visit your dentis twice a year. Depression-Your emotional health is as important as your physical health.  If you're feeling down, losing interest in things you normally enjoy please talk with your healthcare provider. Gun Safety- If you keep a gun in your home, keep it unloaded and with the safety lock on.  Bullets should be stored separately. Helmet use- Always wear a helmet when riding a motorcycle, bicycle, rollerblading or skateboarding. Safe sex- If you may be exposed to a sexually transmitted infection, use a condom Seat belts- Seat bels can save your life; always wear one. Smoke/Carbon Monoxide detectors- These detectors need to be installed on the appropriate level of your home.  Replace batteries at least once a year. Skin Cancer- When out in the sun, cover up and use sunscreen SPF 15 or higher. Violence- If anyone is threatening or hurting you, please tell your healthcare provider.

## 2021-03-15 NOTE — Progress Notes (Signed)
No urinary tract infection.

## 2021-03-15 NOTE — Progress Notes (Signed)
Pt presents to establish care, pt reports prev PCP retired, desires STD testing due to infidelity

## 2021-03-16 ENCOUNTER — Encounter: Payer: Self-pay | Admitting: Family

## 2021-03-16 ENCOUNTER — Other Ambulatory Visit: Payer: Self-pay | Admitting: Family

## 2021-03-16 DIAGNOSIS — B9689 Other specified bacterial agents as the cause of diseases classified elsewhere: Secondary | ICD-10-CM | POA: Insufficient documentation

## 2021-03-16 DIAGNOSIS — A5901 Trichomonal vulvovaginitis: Secondary | ICD-10-CM | POA: Insufficient documentation

## 2021-03-16 LAB — CERVICOVAGINAL ANCILLARY ONLY
Bacterial Vaginitis (gardnerella): POSITIVE — AB
Candida Glabrata: NEGATIVE
Candida Vaginitis: NEGATIVE
Chlamydia: NEGATIVE
Comment: NEGATIVE
Comment: NEGATIVE
Comment: NEGATIVE
Comment: NEGATIVE
Comment: NEGATIVE
Comment: NORMAL
Neisseria Gonorrhea: NEGATIVE
Trichomonas: POSITIVE — AB

## 2021-03-16 MED ORDER — METRONIDAZOLE 500 MG PO TABS: 500.0000 mg | ORAL_TABLET | Freq: Two times a day (BID) | ORAL | 0 refills | Status: AC

## 2021-03-16 NOTE — Progress Notes (Signed)
Gonorrhea, Chlamydia, and Candida Vaginitis (sometimes called yeast infection) negative.   Positive for Trichomonas which is a sexually transmitted infection. Both patient and her partner need to be treated for Trichomonas. Also, both need to complete treatment prior to having unprotected sex again. Do not consume alcohol while taking prescribed antibotic.   Positive for Bacterial Vaginitis, an overgrowth of normal bacteria in the vagina due to changes in pH. Prescribed Metronidazole (Flagyl) twice per day for 7 days. Do not drink alcohol while taking this medication.

## 2021-03-17 ENCOUNTER — Encounter: Payer: Self-pay | Admitting: Family

## 2021-04-09 NOTE — Progress Notes (Deleted)
Patient ID: Audrey Avery, female    DOB: 05-18-1971  MRN: [id number]  CC: Annual Physical Exam   Subjective: Audrey Avery is a 50 y.o. female who presents for annual physical exam.   Her concerns today include:  HYPERTENSION FOLLOW-UP: 03/15/2021: - Continue Amlodipine as prescribed.  - Increase Hydrochlorothiazide from 25 mg daily to 50 mg daily.   04/14/2021:  2. PREDIABETES: Was diabetic in the past then down to predm Any meds    Patient Active Problem List   Diagnosis Date Noted   Trichomonas vaginitis 03/16/2021   Bacterial vaginitis 03/16/2021   Aerophagia 09/08/2020   Essential hypertension 10/02/2019   Syncope and collapse 09/30/2019   DOE (dyspnea on exertion) 09/30/2019   OSA (obstructive sleep apnea) 08/02/2019   Anemia 08/15/2012   Menorrhagia 08/15/2012   Refusal of blood transfusions as patient is Jehovah's Witness 08/15/2012   Uterine fibroid 08/15/2012     Current Outpatient Medications on File Prior to Visit  Medication Sig Dispense Refill   amLODipine (NORVASC) 10 MG tablet Take 1 tablet (10 mg total) by mouth daily. 90 tablet 0   fluticasone (FLONASE) 50 MCG/ACT nasal spray Place 1 spray into both nostrils daily. 16 g 5   hydrochlorothiazide (HYDRODIURIL) 50 MG tablet Take 1 tablet (50 mg total) by mouth daily. 90 tablet 0   ibuprofen (ADVIL) 800 MG tablet Take 800 mg by mouth every 6 (six) hours as needed.     No current facility-administered medications on file prior to visit.    Allergies  Allergen Reactions   Penicillins Anaphylaxis and Swelling    Has patient had a PCN reaction causing immediate rash, facial/tongue/throat swelling, SOB or lightheadedness with hypotension: Yes Has patient had a PCN reaction causing severe rash involving mucus membranes or skin necrosis: No Has patient had a PCN reaction that required hospitalization No Has patient had a PCN reaction occurring within the last 10 years: No If all of the above  answers are "NO", then may proceed with Cephalosporin use.     Social History   Socioeconomic History   Marital status: Single    Spouse name: Not on file   Number of children: Not on file   Years of education: Not on file   Highest education level: Not on file  Occupational History    Employer: [employer]  Tobacco Use   Smoking status: Never   Smokeless tobacco: Never  Vaping Use   Vaping Use: Never used  Substance and Sexual Activity   Alcohol use: No   Drug use: No   Sexual activity: Not on file  Other Topics Concern   Not on file  Social History Narrative   Not on file   Social Determinants of Health   Financial Resource Strain: Not on file  Food Insecurity: Not on file  Transportation Needs: Not on file  Physical Activity: Not on file  Stress: Not on file  Social Connections: Not on file  Intimate Partner Violence: Not on file    Family History  Problem Relation Age of Onset   Cancer Mother    Hypertension Mother    Diabetes Father    Cancer Father    Hypertension Father    Heart attack Father 9   CAD Father 62       She does not know details   Cancer Sister    Hypertension Sister     Past Surgical History:  Procedure Laterality Date   ABDOMINAL HYSTERECTOMY  BREAST SURGERY     CESAREAN SECTION     CHOLECYSTECTOMY     TRANSTHORACIC ECHOCARDIOGRAM  09/2019   EF 60 to 65%.  Mild LVH,  normal valves..  Normal echo    ROS: Review of Systems Negative except as stated above  PHYSICAL EXAM: There were no vitals taken for this visit.  Physical Exam  {female adult master:310786} {female adult master:310785}  CMP Latest Ref Rng & Units 06/29/2020 05/25/2020 03/25/2019  Glucose 65 - 99 mg/dL 83 90 103(H)  BUN 6 - 24 mg/dL 12 11 9   Creatinine 0.57 - 1.00 mg/dL 0.64 0.67 0.57  Sodium 134 - 144 mmol/L 138 140 140  Potassium 3.5 - 5.2 mmol/L 4.3 3.9 4.1  Chloride 96 - 106 mmol/L 102 102 99  CO2 20 - 29 mmol/L 23 25 29   Calcium 8.7 -  10.2 mg/dL 9.2 9.0 8.9  Total Protein 6.0 - 8.5 g/dL - 7.6 -  Total Bilirubin 0.0 - 1.2 mg/dL - 0.3 -  Alkaline Phos 44 - 121 IU/L - 85 -  AST 0 - 40 IU/L - 18 -  ALT 0 - 32 IU/L - 18 -   Lipid Panel     Component Value Date/Time   CHOL 175 05/25/2020 1432   TRIG 79 05/25/2020 1432   HDL 49 05/25/2020 1432   CHOLHDL 3.6 05/25/2020 1432   LDLCALC 111 (H) 05/25/2020 1432    CBC    Component Value Date/Time   WBC 6.7 09/06/2019 1535   WBC 6.5 10/15/2018 1151   RBC 5.41 09/06/2019 1535   RBC 4.98 10/15/2018 1151   HGB 12.9 09/06/2019 1535   HGB 12.6 03/25/2019 1705   HGB 9.6 (L) 02/14/2013 1552   HCT 42.1 (A) 09/06/2019 1535   HCT 40.0 03/25/2019 1705   HCT 30.6 (L) 02/14/2013 1552   PLT 356 03/25/2019 1705   MCV 77.7 09/06/2019 1535   MCV 76 (L) 03/25/2019 1705   MCV 73.9 (L) 02/14/2013 1552   MCH 23.8 (A) 09/06/2019 1535   MCH 24.3 (L) 10/15/2018 1151   MCHC 30.7 (A) 09/06/2019 1535   MCHC 30.3 10/15/2018 1151   RDW 15.2 03/25/2019 1705   RDW 17.8 (H) 02/14/2013 1552   LYMPHSABS 2.3 02/14/2013 1552   MONOABS 0.6 02/14/2013 1552   EOSABS 0.1 02/15/2017 1632   BASOSABS 0.1 02/14/2013 1552    ASSESSMENT AND PLAN:  There are no diagnoses linked to this encounter.   Patient was given the opportunity to ask questions.  Patient verbalized understanding of the plan and was able to repeat key elements of the plan. Patient was given clear instructions to go to Emergency Department or return to medical center if symptoms don't improve, worsen, or new problems develop.The patient verbalized understanding.   No orders of the defined types were placed in this encounter.    Requested Prescriptions    No prescriptions requested or ordered in this encounter    No follow-ups on file.  Camillia Herter, NP

## 2021-04-14 ENCOUNTER — Encounter: Payer: 59 | Admitting: Family

## 2021-04-14 DIAGNOSIS — Z13220 Encounter for screening for lipoid disorders: Secondary | ICD-10-CM

## 2021-04-14 DIAGNOSIS — R7303 Prediabetes: Secondary | ICD-10-CM

## 2021-04-14 DIAGNOSIS — Z1329 Encounter for screening for other suspected endocrine disorder: Secondary | ICD-10-CM

## 2021-04-14 DIAGNOSIS — I10 Essential (primary) hypertension: Secondary | ICD-10-CM

## 2021-04-14 DIAGNOSIS — Z130 Encounter for screening for diseases of the blood and blood-forming organs and certain disorders involving the immune mechanism: Secondary | ICD-10-CM

## 2021-04-14 DIAGNOSIS — Z1211 Encounter for screening for malignant neoplasm of colon: Secondary | ICD-10-CM

## 2021-04-14 DIAGNOSIS — Z0000 Encounter for general adult medical examination without abnormal findings: Secondary | ICD-10-CM

## 2021-04-14 DIAGNOSIS — Z13228 Encounter for screening for other metabolic disorders: Secondary | ICD-10-CM

## 2021-05-14 ENCOUNTER — Ambulatory Visit: Payer: Self-pay

## 2021-05-14 NOTE — Telephone Encounter (Signed)
Pt calling in stating that she has a bump that came up on her R ear Tuesday and the bump has gotten bigger and caused swelling to her R ear and face area. Pt denies having any lip swelling. The swelling started late Wednesday evening. She says the area is sore to touch and warm. Her sister gave her some cream for cysts that she used and its not helped symptoms. Pt reports that this is the side she sleeps on so she is unable to sleep on that side right now. Advised pt go to virtual UC visit tonight if possible to be evaluated. Pt says she will go ahead and do that tonight. Care advice given and pt verbalized understanding. No other questions/concerns noted.     Reason for Disposition  [1] Swelling is red AND [2] very painful to touch  Answer Assessment - Initial Assessment Questions 1. ONSET: "When did the swelling start?" (e.g., minutes, hours, days)     Wednesday evening 2. LOCATION: "What part of the face is swollen?"     R ear and R face 3. SEVERITY: "How swollen is it?"     Moderate swelling 4. ITCHING: "Is there any itching?" If Yes, ask: "How much?"   (Scale 1-10; mild, moderate or severe)     no 5. PAIN: "Is the swelling painful to touch?" If Yes, ask: "How painful is it?"   (Scale 1-10; mild, moderate or severe)   - NONE (0): no pain   - MILD (1-3): doesn't interfere with normal activities    - MODERATE (4-7): interferes with normal activities or awakens from sleep    - SEVERE (8-10): excruciating pain, unable to do any normal activities      mild 6. FEVER: "Do you have a fever?" If Yes, ask: "What is it, how was it measured, and when did it start?"      No 7. CAUSE: "What do you think is causing the face swelling?"     Started out as bump on R ear on Tuesday  8. RECURRENT SYMPTOM: "Have you had face swelling before?" If Yes, ask: "When was the last time?" "What happened that time?"     No 9. OTHER SYMPTOMS: "Do you have any other symptoms?" (e.g., toothache, leg swelling)      Swelling is sore and warm to touch 10. PREGNANCY: "Is there any chance you are pregnant?" "When was your last menstrual period?"       No  Protocols used: Face Swelling-A-AH

## 2021-05-15 ENCOUNTER — Telehealth: Payer: 59 | Admitting: Nurse Practitioner

## 2021-05-15 DIAGNOSIS — L03211 Cellulitis of face: Secondary | ICD-10-CM | POA: Diagnosis not present

## 2021-05-15 MED ORDER — SULFAMETHOXAZOLE-TRIMETHOPRIM 800-160 MG PO TABS: 1.0000 | ORAL_TABLET | Freq: Two times a day (BID) | ORAL | 0 refills | Status: AC

## 2021-05-15 NOTE — Progress Notes (Signed)
Virtual Visit Consent   Audrey Avery, you are scheduled for a virtual visit with a [facility] provider today.     Just as with appointments in the office, your consent must be obtained to participate.  Your consent will be active for this visit and any virtual visit you may have with one of our providers in the next 365 days.     If you have a MyChart account, a copy of this consent can be sent to you electronically.  All virtual visits are billed to your insurance company just like a traditional visit in the office.    As this is a virtual visit, video technology does not allow for your provider to perform a traditional examination.  This may limit your provider's ability to fully assess your condition.  If your provider identifies any concerns that need to be evaluated in person or the need to arrange testing (such as labs, EKG, etc.), we will make arrangements to do so.     Although advances in technology are sophisticated, we cannot ensure that it will always work on either your end or our end.  If the connection with a video visit is poor, the visit may have to be switched to a telephone visit.  With either a video or telephone visit, we are not always able to ensure that we have a secure connection.     I need to obtain your verbal consent now.   Are you willing to proceed with your visit today?    Audrey Avery has provided verbal consent on 05/15/2021 for a virtual visit (video or telephone).   Audrey Pounds, NP   Date: 05/15/2021 8:13 AM   Virtual Visit via Video Note   I, Audrey Avery, connected with  Audrey Avery  ([id number], 03/28/1971) on 05/15/21 at  7:45 AM EST by a video-enabled telemedicine application and verified that I am speaking with the correct person using two identifiers.  Location: Patient: Virtual Visit Location Patient: Home Provider: Virtual Visit Location Provider: Home Office   I discussed the limitations of evaluation and management  by telemedicine and the availability of in person appointments. The patient expressed understanding and agreed to proceed.    History of Present Illness: Audrey Avery is a 50 y.o. who identifies as a female who was assigned female at birth, and is being seen today for cellulitis.  HPI:  Audrey Avery states she noticed a pop like a pimple behind her right ear earlier this week and on Thursday she attempted to manually excise what she presumed was fluid inside the bump.over the past 24 hours she is noted for right-sided facial swelling. She denies pain, fever, purulent drainage from the lesion behind her ear.  She does note mild tenderness with palpation of the right side of her face.   Problems:  Patient Active Problem List   Diagnosis Date Noted   Trichomonas vaginitis 03/16/2021   Bacterial vaginitis 03/16/2021   Aerophagia 09/08/2020   Essential hypertension 10/02/2019   Syncope and collapse 09/30/2019   DOE (dyspnea on exertion) 09/30/2019   OSA (obstructive sleep apnea) 08/02/2019   Anemia 08/15/2012   Menorrhagia 08/15/2012   Refusal of blood transfusions as patient is Jehovah's Witness 08/15/2012   Uterine fibroid 08/15/2012    Allergies:  Allergies  Allergen Reactions   Penicillins Anaphylaxis and Swelling    Has patient had a PCN reaction causing immediate rash, facial/tongue/throat swelling, SOB or lightheadedness with hypotension: Yes Has  patient had a PCN reaction causing severe rash involving mucus membranes or skin necrosis: No Has patient had a PCN reaction that required hospitalization No Has patient had a PCN reaction occurring within the last 10 years: No If all of the above answers are "NO", then may proceed with Cephalosporin use.    Medications:  Current Outpatient Medications:    sulfamethoxazole-trimethoprim (BACTRIM DS) 800-160 MG tablet, Take 1 tablet by mouth 2 (two) times daily for 7 days., Disp: 14 tablet, Rfl: 0   amLODipine (NORVASC) 10 MG tablet,  Take 1 tablet (10 mg total) by mouth daily., Disp: 90 tablet, Rfl: 0   fluticasone (FLONASE) 50 MCG/ACT nasal spray, Place 1 spray into both nostrils daily., Disp: 16 g, Rfl: 5   hydrochlorothiazide (HYDRODIURIL) 50 MG tablet, Take 1 tablet (50 mg total) by mouth daily., Disp: 90 tablet, Rfl: 0   ibuprofen (ADVIL) 800 MG tablet, Take 800 mg by mouth every 6 (six) hours as needed., Disp: , Rfl:   Observations/Objective: Patient is well-developed, well-nourished in no acute distress.  Resting comfortably in bathroom at home.  Head is normocephalic, atraumatic.  No labored breathing.  Speech is clear and coherent with logical content.  Patient is alert and oriented at baseline.  There is notable right-sided facial swelling however no signs of erythema.  Assessment and Plan: 1. Cellulitis of face - sulfamethoxazole-trimethoprim (BACTRIM DS) 800-160 MG tablet; Take 1 tablet by mouth 2 (two) times daily for 7 days.  Dispense: 14 tablet; Refill: 0 PATIENT INSTRUCTED TO GO TO URGENT CARE OR ED IF ANY OF THE FOLLOWING: DEVELOPS FEVER INCREASED FACIAL SWELLING INCREASED PAIN PURULENT DRAINAGE INCREASES FROM SITE  Follow Up Instructions: I discussed the assessment and treatment plan with the patient. The patient was provided an opportunity to ask questions and all were answered. The patient agreed with the plan and demonstrated an understanding of the instructions.  A copy of instructions were sent to the patient via MyChart unless otherwise noted below.   The patient was advised to call back or seek an in-person evaluation if the symptoms worsen or if the condition fails to improve as anticipated.  Time:  I spent 10 minutes with the patient via telehealth technology discussing the above problems/concerns.    Audrey Pounds, NP

## 2021-05-15 NOTE — Patient Instructions (Addendum)
  Audrey Avery, thank you for joining Gildardo Pounds, NP for today's virtual visit.  While this provider is not your primary care provider (PCP), if your PCP is located in our provider database this encounter information will be shared with them immediately following your visit.  Consent: (Patient) Audrey Avery provided verbal consent for this virtual visit at the beginning of the encounter.  Current Medications:  Current Outpatient Medications:    sulfamethoxazole-trimethoprim (BACTRIM DS) 800-160 MG tablet, Take 1 tablet by mouth 2 (two) times daily for 7 days., Disp: 14 tablet, Rfl: 0   amLODipine (NORVASC) 10 MG tablet, Take 1 tablet (10 mg total) by mouth daily., Disp: 90 tablet, Rfl: 0   fluticasone (FLONASE) 50 MCG/ACT nasal spray, Place 1 spray into both nostrils daily., Disp: 16 g, Rfl: 5   hydrochlorothiazide (HYDRODIURIL) 50 MG tablet, Take 1 tablet (50 mg total) by mouth daily., Disp: 90 tablet, Rfl: 0   ibuprofen (ADVIL) 800 MG tablet, Take 800 mg by mouth every 6 (six) hours as needed., Disp: , Rfl:    Medications ordered in this encounter:  Meds ordered this encounter  Medications   sulfamethoxazole-trimethoprim (BACTRIM DS) 800-160 MG tablet    Sig: Take 1 tablet by mouth 2 (two) times daily for 7 days.    Dispense:  14 tablet    Refill:  0    Order Specific Question:   Supervising Provider    Answer:   Sabra Heck, BRIAN [[id number]]     *If you need refills on other medications prior to your next appointment, please contact your pharmacy*  Follow-Up: Call back or seek an in-person evaluation if the symptoms worsen or if the condition fails to improve as anticipated.  Other Instructions PATIENT INSTRUCTED TO GO TO URGENT CARE OR ED IF ANY OF THE FOLLOWING: DEVELOPS FEVER INCREASED FACIAL SWELLING INCREASED PAIN PURULENT DRAINAGE INCREASES FROM SITE   If you have been instructed to have an in-person evaluation today at a local Urgent Care facility, please use the  link below. It will take you to a list of all of our available [facility], including address, phone number and hours of operation. Please do not delay care.  [facility]  If you or a family member do not have a primary care provider, use the link below to schedule a visit and establish care. When you choose a [facility] primary care physician or advanced practice provider, you gain a long-term partner in health. Find a Primary Care Provider  Learn more about [place] [facility]'s in-office and virtual care options: [facility]

## 2021-09-16 NOTE — Progress Notes (Signed)
? ? ?Patient ID: Audrey Avery, female    DOB: 1970-12-25  MRN: [id number] ? ?CC: Rash ? ? ?Subjective: ?Audrey Avery is a 51 y.o. female who presents for rash. ? ?Her concerns today include:  ?RASH: ?Reports itching rash beneath bilateral breasts. Scratching sometimes causing skin to become raw. Reports had occurrences of the same in the past. Prescribed ointment which helped. Also, new onset itching moles of central chest. ? ?2. CONGESTION: ?Congestion began 3 days ago with cough and runny nose . Has tried several over-the-counter medications without relief. ? ?Patient Active Problem List  ? Diagnosis Date Noted  ? Trichomonas vaginitis 03/16/2021  ? Bacterial vaginitis 03/16/2021  ? Aerophagia 09/08/2020  ? Essential hypertension 10/02/2019  ? Syncope and collapse 09/30/2019  ? DOE (dyspnea on exertion) 09/30/2019  ? OSA (obstructive sleep apnea) 08/02/2019  ? Anemia 08/15/2012  ? Menorrhagia 08/15/2012  ? Refusal of blood transfusions as patient is Jehovah's Witness 08/15/2012  ? Uterine fibroid 08/15/2012  ?  ? ?Current Outpatient Medications on File Prior to Visit  ?Medication Sig Dispense Refill  ? amLODipine (NORVASC) 10 MG tablet Take 1 tablet (10 mg total) by mouth daily. 90 tablet 0  ? fluticasone (FLONASE) 50 MCG/ACT nasal spray Place 1 spray into both nostrils daily. 16 g 5  ? hydrochlorothiazide (HYDRODIURIL) 50 MG tablet Take 1 tablet (50 mg total) by mouth daily. 90 tablet 0  ? ibuprofen (ADVIL) 800 MG tablet Take 800 mg by mouth every 6 (six) hours as needed.    ? ?No current facility-administered medications on file prior to visit.  ? ? ?Allergies  ?Allergen Reactions  ? Penicillins Anaphylaxis and Swelling  ?  Has patient had a PCN reaction causing immediate rash, facial/tongue/throat swelling, SOB or lightheadedness with hypotension: Yes ?Has patient had a PCN reaction causing severe rash involving mucus membranes or skin necrosis: No ?Has patient had a PCN reaction that required  hospitalization No ?Has patient had a PCN reaction occurring within the last 10 years: No ?If all of the above answers are "NO", then may proceed with Cephalosporin use. ?  ? ? ?Social History  ? ?Socioeconomic History  ? Marital status: Single  ?  Spouse name: Not on file  ? Number of children: Not on file  ? Years of education: Not on file  ? Highest education level: Not on file  ?Occupational History  ?  Employer: [employer]  ?Tobacco Use  ? Smoking status: Never  ? Smokeless tobacco: Never  ?Vaping Use  ? Vaping Use: Never used  ?Substance and Sexual Activity  ? Alcohol use: No  ? Drug use: No  ? Sexual activity: Not on file  ?Other Topics Concern  ? Not on file  ?Social History Narrative  ? Not on file  ? ?Social Determinants of Health  ? ?Financial Resource Strain: Not on file  ?Food Insecurity: Not on file  ?Transportation Needs: Not on file  ?Physical Activity: Not on file  ?Stress: Not on file  ?Social Connections: Not on file  ?Intimate Partner Violence: Not on file  ? ? ?Family History  ?Problem Relation Age of Onset  ? Cancer Mother   ? Hypertension Mother   ? Diabetes Father   ? Cancer Father   ? Hypertension Father   ? Heart attack Father 31  ? CAD Father 74  ?     She does not know details  ? Cancer Sister   ? Hypertension Sister   ? ? ?  Past Surgical History:  ?Procedure Laterality Date  ? ABDOMINAL HYSTERECTOMY    ? BREAST SURGERY    ? CESAREAN SECTION    ? CHOLECYSTECTOMY    ? TRANSTHORACIC ECHOCARDIOGRAM  09/2019  ? EF 60 to 65%.  Mild LVH,  normal valves..  Normal echo  ? ? ?ROS: ?Review of Systems ?Negative except as stated above ? ?PHYSICAL EXAM: ?BP (!) 146/102 (BP Location: Left Arm, Patient Position: Sitting, Cuff Size: Large)   Pulse 90   Temp 98.3 ?F (36.8 ?C)   Resp 18   Ht 5' 5.98" (1.676 m)   Wt 223 lb (101.2 kg)   SpO2 95%   BMI 36.01 kg/m?  ? ?Physical Exam ?HENT:  ?   Head: Normocephalic and atraumatic.  ?Eyes:  ?   Extraocular Movements: Extraocular movements  intact.  ?   Conjunctiva/sclera: Conjunctivae normal.  ?   Pupils: Pupils are equal, round, and reactive to light.  ?Cardiovascular:  ?   Rate and Rhythm: Normal rate and regular rhythm.  ?   Pulses: Normal pulses.  ?   Heart sounds: Normal heart sounds.  ?Pulmonary:  ?   Effort: Pulmonary effort is normal.  ?   Breath sounds: Normal breath sounds.  ?Musculoskeletal:  ?   Cervical back: Normal range of motion and neck supple.  ?Skin: ?   Findings: Rash present.  ?   Comments: Rash beneath bilateral breasts. Several moles central chest.   ?Neurological:  ?   General: No focal deficit present.  ?   Mental Status: She is alert and oriented to person, place, and time.  ?Psychiatric:     ?   Mood and Affect: Mood normal.     ?   Behavior: Behavior normal.  ? ?ASSESSMENT AND PLAN: ?1. Flu-like symptoms: ?- Respiratory panel to screen for possible causes.  ?- COVID-19, Flu A+B and RSV ? ?2. Congestion of nasal sinus: ?- Continue over-the-counter regimen.  ?- Prednisone as prescribed. Counseled on medication adherence and adverse effects.  ?- predniSONE (DELTASONE) 10 MG tablet; Take 4 tablets (40 mg total) by mouth daily with breakfast for 1 day, THEN 3 tablets (30 mg total) daily with breakfast for 1 day, THEN 2 tablets (20 mg total) daily with breakfast for 1 day, THEN 1 tablet (10 mg total) daily with breakfast for 1 day.  Dispense: 10 tablet; Refill: 0 ? ?3. Acute cough: ?- Continue over-the-counter regimen. ?- Benzonatate capsules as prescribed. Counseled on medication adherence and adverse effects. ?- benzonatate (TESSALON) 100 MG capsule; Take 1 capsule (100 mg total) by mouth 2 (two) times daily as needed for cough.  Dispense: 20 capsule; Refill: 0 ? ?4. Candidiasis of breast: ?- Nystatin ointment as prescribed. Counseled on medication adherence and adverse effects.  ?- Referral to Dermatology for further evaluation and management. ?- nystatin ointment (MYCOSTATIN); Apply 1 application. topically 2 (two) times  daily.  Dispense: 60 g; Refill: 0 ?- Ambulatory referral to Dermatology ? ?5. Change in skin mole: ?- Referral to Dermatology for further evaluation and management.  ?- Ambulatory referral to Dermatology ? ? ? ?Patient was given the opportunity to ask questions.  Patient verbalized understanding of the plan and was able to repeat key elements of the plan. Patient was given clear instructions to go to Emergency Department or return to medical center if symptoms don't improve, worsen, or new problems develop.The patient verbalized understanding. ? ? ?Orders Placed This Encounter  ?Procedures  ? COVID-19, Flu A+B and RSV  ?  Ambulatory referral to Dermatology  ? ? ? ?Requested Prescriptions  ? ?Signed Prescriptions Disp Refills  ? benzonatate (TESSALON) 100 MG capsule 20 capsule 0  ?  Sig: Take 1 capsule (100 mg total) by mouth 2 (two) times daily as needed for cough.  ? nystatin ointment (MYCOSTATIN) 60 g 0  ?  Sig: Apply 1 application. topically 2 (two) times daily.  ? predniSONE (DELTASONE) 10 MG tablet 10 tablet 0  ?  Sig: Take 4 tablets (40 mg total) by mouth daily with breakfast for 1 day, THEN 3 tablets (30 mg total) daily with breakfast for 1 day, THEN 2 tablets (20 mg total) daily with breakfast for 1 day, THEN 1 tablet (10 mg total) daily with breakfast for 1 day.  ? ? ?Follow-up with primary provider as scheduled.  ? ?Camillia Herter, NP  ?

## 2021-09-17 ENCOUNTER — Ambulatory Visit: Payer: 59 | Admitting: Family

## 2021-09-17 VITALS — BP 146/102 | HR 90 | Temp 98.3°F | Resp 18 | Ht 65.98 in | Wt 223.0 lb

## 2021-09-17 DIAGNOSIS — R0981 Nasal congestion: Secondary | ICD-10-CM | POA: Diagnosis not present

## 2021-09-17 DIAGNOSIS — B3789 Other sites of candidiasis: Secondary | ICD-10-CM

## 2021-09-17 DIAGNOSIS — D229 Melanocytic nevi, unspecified: Secondary | ICD-10-CM

## 2021-09-17 DIAGNOSIS — R6889 Other general symptoms and signs: Secondary | ICD-10-CM

## 2021-09-17 DIAGNOSIS — R051 Acute cough: Secondary | ICD-10-CM

## 2021-09-17 MED ORDER — PREDNISONE 10 MG PO TABS: ORAL_TABLET | ORAL | 0 refills | Status: AC

## 2021-09-17 MED ORDER — NYSTATIN 100000 UNIT/GM EX OINT: 1.0000 "application " | TOPICAL_OINTMENT | Freq: Two times a day (BID) | CUTANEOUS | 0 refills | Status: DC

## 2021-09-17 MED ORDER — BENZONATATE 100 MG PO CAPS: 100.0000 mg | ORAL_CAPSULE | Freq: Two times a day (BID) | ORAL | 0 refills | Status: DC | PRN

## 2021-09-17 NOTE — Progress Notes (Signed)
Pt presents for rash under breast bilaterally, pt states that she has some runny nose, cough and congestion that started Tuesday night  ?

## 2021-09-19 LAB — COVID-19, FLU A+B AND RSV
Influenza A, NAA: NOT DETECTED
Influenza B, NAA: NOT DETECTED
RSV, NAA: NOT DETECTED
SARS-CoV-2, NAA: NOT DETECTED

## 2021-09-19 NOTE — Progress Notes (Signed)
Covid, Flu, RSV negative.

## 2021-09-21 ENCOUNTER — Ambulatory Visit: Payer: 59 | Admitting: Family

## 2021-11-10 NOTE — Progress Notes (Unsigned)
Patient ID: Audrey Avery, female    DOB: 13-Aug-1970  MRN: [id number]  CC: No chief complaint on file.   Subjective: Audrey Avery is a 51 y.o. female who presents for Her concerns today include: ***   Last appointment for hypertension 03/15/2021. Continued on Amlodipine. Hydrochlorothiazide increased.  Patient Active Problem List   Diagnosis Date Noted   Trichomonas vaginitis 03/16/2021   Bacterial vaginitis 03/16/2021   Aerophagia 09/08/2020   Essential hypertension 10/02/2019   Syncope and collapse 09/30/2019   DOE (dyspnea on exertion) 09/30/2019   OSA (obstructive sleep apnea) 08/02/2019   Anemia 08/15/2012   Menorrhagia 08/15/2012   Refusal of blood transfusions as patient is Jehovah's Witness 08/15/2012   Uterine fibroid 08/15/2012     Current Outpatient Medications on File Prior to Visit  Medication Sig Dispense Refill   amLODipine (NORVASC) 10 MG tablet Take 1 tablet (10 mg total) by mouth daily. 90 tablet 0   benzonatate (TESSALON) 100 MG capsule Take 1 capsule (100 mg total) by mouth 2 (two) times daily as needed for cough. 20 capsule 0   fluticasone (FLONASE) 50 MCG/ACT nasal spray Place 1 spray into both nostrils daily. 16 g 5   hydrochlorothiazide (HYDRODIURIL) 50 MG tablet Take 1 tablet (50 mg total) by mouth daily. 90 tablet 0   ibuprofen (ADVIL) 800 MG tablet Take 800 mg by mouth every 6 (six) hours as needed.     nystatin ointment (MYCOSTATIN) Apply 1 application. topically 2 (two) times daily. 60 g 0   No current facility-administered medications on file prior to visit.    Allergies  Allergen Reactions   Penicillins Anaphylaxis and Swelling    Has patient had a PCN reaction causing immediate rash, facial/tongue/throat swelling, SOB or lightheadedness with hypotension: Yes Has patient had a PCN reaction causing severe rash involving mucus membranes or skin necrosis: No Has patient had a PCN reaction that required hospitalization No Has patient  had a PCN reaction occurring within the last 10 years: No If all of the above answers are "NO", then may proceed with Cephalosporin use.     Social History   Socioeconomic History   Marital status: Single    Spouse name: Not on file   Number of children: Not on file   Years of education: Not on file   Highest education level: Not on file  Occupational History    Employer: [employer]  Tobacco Use   Smoking status: Never   Smokeless tobacco: Never  Vaping Use   Vaping Use: Never used  Substance and Sexual Activity   Alcohol use: No   Drug use: No   Sexual activity: Not on file  Other Topics Concern   Not on file  Social History Narrative   Not on file   Social Determinants of Health   Financial Resource Strain: Not on file  Food Insecurity: Not on file  Transportation Needs: Not on file  Physical Activity: Not on file  Stress: Not on file  Social Connections: Not on file  Intimate Partner Violence: Not on file    Family History  Problem Relation Age of Onset   Cancer Mother    Hypertension Mother    Diabetes Father    Cancer Father    Hypertension Father    Heart attack Father 54   CAD Father 4       She does not know details   Cancer Sister    Hypertension Sister  Past Surgical History:  Procedure Laterality Date   ABDOMINAL HYSTERECTOMY     BREAST SURGERY     CESAREAN SECTION     CHOLECYSTECTOMY     TRANSTHORACIC ECHOCARDIOGRAM  09/2019   EF 60 to 65%.  Mild LVH,  normal valves..  Normal echo    ROS: Review of Systems Negative except as stated above  PHYSICAL EXAM: There were no vitals taken for this visit.  Physical Exam  {female adult master:310786} {female adult master:310785}     Latest Ref Rng & Units 06/29/2020    4:37 PM 05/25/2020    2:32 PM 03/25/2019    5:05 PM  CMP  Glucose 65 - 99 mg/dL 83   90   103    BUN 6 - 24 mg/dL '12   11   9    '$ Creatinine 0.57 - 1.00 mg/dL 0.64   0.67   0.57    Sodium 134 - 144 mmol/L  138   140   140    Potassium 3.5 - 5.2 mmol/L 4.3   3.9   4.1    Chloride 96 - 106 mmol/L 102   102   99    CO2 20 - 29 mmol/L '23   25   29    '$ Calcium 8.7 - 10.2 mg/dL 9.2   9.0   8.9    Total Protein 6.0 - 8.5 g/dL  7.6     Total Bilirubin 0.0 - 1.2 mg/dL  0.3     Alkaline Phos 44 - 121 IU/L  85     AST 0 - 40 IU/L  18     ALT 0 - 32 IU/L  18      Lipid Panel     Component Value Date/Time   CHOL 175 05/25/2020 1432   TRIG 79 05/25/2020 1432   HDL 49 05/25/2020 1432   CHOLHDL 3.6 05/25/2020 1432   LDLCALC 111 (H) 05/25/2020 1432    CBC    Component Value Date/Time   WBC 6.7 09/06/2019 1535   WBC 6.5 10/15/2018 1151   RBC 5.41 09/06/2019 1535   RBC 4.98 10/15/2018 1151   HGB 12.9 09/06/2019 1535   HGB 12.6 03/25/2019 1705   HGB 9.6 (L) 02/14/2013 1552   HCT 42.1 (A) 09/06/2019 1535   HCT 40.0 03/25/2019 1705   HCT 30.6 (L) 02/14/2013 1552   PLT 356 03/25/2019 1705   MCV 77.7 09/06/2019 1535   MCV 76 (L) 03/25/2019 1705   MCV 73.9 (L) 02/14/2013 1552   MCH 23.8 (A) 09/06/2019 1535   MCH 24.3 (L) 10/15/2018 1151   MCHC 30.7 (A) 09/06/2019 1535   MCHC 30.3 10/15/2018 1151   RDW 15.2 03/25/2019 1705   RDW 17.8 (H) 02/14/2013 1552   LYMPHSABS 2.3 02/14/2013 1552   MONOABS 0.6 02/14/2013 1552   EOSABS 0.1 02/15/2017 1632   BASOSABS 0.1 02/14/2013 1552    ASSESSMENT AND PLAN:  There are no diagnoses linked to this encounter.   Patient was given the opportunity to ask questions.  Patient verbalized understanding of the plan and was able to repeat key elements of the plan. Patient was given clear instructions to go to Emergency Department or return to medical center if symptoms don't improve, worsen, or new problems develop.The patient verbalized understanding.   No orders of the defined types were placed in this encounter.    Requested Prescriptions    No prescriptions requested or ordered in this encounter    No follow-ups  on file.  Camillia Herter, NP

## 2021-11-11 ENCOUNTER — Ambulatory Visit: Payer: 59 | Admitting: Family

## 2021-12-28 NOTE — Progress Notes (Signed)
Patient ID: Audrey Avery, female    DOB: [birth year]  MRN: [id number]  CC: Hypertension   Subjective: Audrey Avery is a 51 y.o. female who presents for hypertension.   Her concerns today include:  - Doing well on current blood pressure medications. Doesn't take daily as prescribed. Usually taking a few times weekly because doesn't like to take medications often. Blood pressures on smart watch normal. Denies red flag symptoms.  - Persisting irritation underneath bilateral breasts. Ointment previously prescribed didn't work well due to consistency being slippery. Referred to Dermatology in the past but was unable to make that appointment.  - Would like to begin medication for weight management. Weight plateaus around 220 to 225 pounds. Goal to lose 20 pounds. She is monitoring what she eats. Not exercising as she would like to due to hot weather.    Patient Active Problem List   Diagnosis Date Noted   Trichomonas vaginitis 03/16/2021   Bacterial vaginitis 03/16/2021   Aerophagia 09/08/2020   Essential hypertension 10/02/2019   Syncope and collapse 09/30/2019   DOE (dyspnea on exertion) 09/30/2019   OSA (obstructive sleep apnea) 08/02/2019   Anemia 08/15/2012   Menorrhagia 08/15/2012   Refusal of blood transfusions as patient is Jehovah's Witness 08/15/2012   Uterine fibroid 08/15/2012     Current Outpatient Medications on File Prior to Visit  Medication Sig Dispense Refill   ibuprofen (ADVIL) 800 MG tablet Take 800 mg by mouth every 6 (six) hours as needed.     No current facility-administered medications on file prior to visit.    Allergies  Allergen Reactions   Penicillins Anaphylaxis and Swelling    Has patient had a PCN reaction causing immediate rash, facial/tongue/throat swelling, SOB or lightheadedness with hypotension: Yes Has patient had a PCN reaction causing severe rash involving mucus membranes or skin necrosis: No Has patient had a PCN reaction that  required hospitalization No Has patient had a PCN reaction occurring within the last 10 years: No If all of the above answers are "NO", then may proceed with Cephalosporin use.     Social History   Socioeconomic History   Marital status: Single    Spouse name: Not on file   Number of children: Not on file   Years of education: Not on file   Highest education level: Not on file  Occupational History    Employer: [employer]  Tobacco Use   Smoking status: Never    Passive exposure: Never   Smokeless tobacco: Never  Vaping Use   Vaping Use: Never used  Substance and Sexual Activity   Alcohol use: No   Drug use: No   Sexual activity: Yes  Other Topics Concern   Not on file  Social History Narrative   Not on file   Social Determinants of Health   Financial Resource Strain: Not on file  Food Insecurity: Not on file  Transportation Needs: Not on file  Physical Activity: Not on file  Stress: Not on file  Social Connections: Not on file  Intimate Partner Violence: Not on file    Family History  Problem Relation Age of Onset   Cancer Mother    Hypertension Mother    Diabetes Father    Cancer Father    Hypertension Father    Heart attack Father 31   CAD Father 6       She does not know details   Cancer Sister    Hypertension Sister  Past Surgical History:  Procedure Laterality Date   ABDOMINAL HYSTERECTOMY     BREAST SURGERY     CESAREAN SECTION     CHOLECYSTECTOMY     TRANSTHORACIC ECHOCARDIOGRAM  09/2019   EF 60 to 65%.  Mild LVH,  normal valves..  Normal echo    ROS: Review of Systems Negative except as stated above  PHYSICAL EXAM: BP (!) 156/96 (BP Location: Left Arm, Patient Position: Sitting, Cuff Size: Large)   Pulse 78   Temp 98.3 F (36.8 C)   Resp 16   Ht 5' 5.98" (1.676 m)   Wt 220 lb (99.8 kg)   SpO2 98%   BMI 35.53 kg/m   Physical Exam HENT:     Head: Normocephalic and atraumatic.  Eyes:     Extraocular Movements:  Extraocular movements intact.     Conjunctiva/sclera: Conjunctivae normal.     Pupils: Pupils are equal, round, and reactive to light.  Cardiovascular:     Rate and Rhythm: Normal rate and regular rhythm.     Pulses: Normal pulses.     Heart sounds: Normal heart sounds.  Pulmonary:     Effort: Pulmonary effort is normal.     Breath sounds: Normal breath sounds.  Musculoskeletal:     Cervical back: Normal range of motion and neck supple.  Neurological:     General: No focal deficit present.     Mental Status: She is alert and oriented to person, place, and time.  Psychiatric:        Mood and Affect: Mood normal.        Behavior: Behavior normal.    ASSESSMENT AND PLAN: 1. Essential (primary) hypertension - Counseled to take Amlodipine and Hydrochlorothiazide as prescribed.  - Counseled on blood pressure goal of less than 130/80, low-sodium, DASH diet, medication compliance, and 150 minutes of moderate intensity exercise per week as tolerated. Counseled on medication adherence and adverse effects. - Update BMP. - Follow-up with primary provider in 2 weeks or sooner if needed for blood pressure check. - amLODipine (NORVASC) 10 MG tablet; Take 1 tablet (10 mg total) by mouth daily.  Dispense: 30 tablet; Refill: 2 - hydrochlorothiazide (HYDRODIURIL) 50 MG tablet; Take 1 tablet (50 mg total) by mouth daily.  Dispense: 30 tablet; Refill: 2 - Basic Metabolic Panel  2. Encounter for weight management - Liraglutide - Weight Management as prescribed. Counseled on medication adherence and adverse effects.  - Counseled weight loss goal of 5% within 12 weeks or recommendation to discontinue regimen. Also, counseled on the importance of weight checks every 4 weeks. Patient verbalized understanding. - Counseled on low-sodium heart healthy diet and 150 minutes of moderate intensity exercise per week as tolerated.  - Follow-up with primary provider in 4 weeks or sooner if needed.  - Liraglutide  -Weight Management 18 MG/3ML SOPN; Inject 0.6 mg into the skin daily for 7 days, THEN 1.2 mg daily for 7 days, THEN 1.8 mg daily for 7 days, THEN 2.4 mg daily for 7 days.  Dispense: 6 mL; Refill: 0  3. Candidiasis of breast - Nystatin powder as prescribed. Counseled on medication adherence and adverse effects.  - Referral to Dermatology for further evaluation and management.  - nystatin (MYCOSTATIN/NYSTOP) powder; Apply 1 Application topically 3 (three) times daily.  Dispense: 60 g; Refill: 2 - Ambulatory referral to Dermatology   Patient was given the opportunity to ask questions.  Patient verbalized understanding of the plan and was able to repeat key elements of the  plan. Patient was given clear instructions to go to Emergency Department or return to medical center if symptoms don't improve, worsen, or new problems develop.The patient verbalized understanding.   Orders Placed This Encounter  Procedures   Basic Metabolic Panel   Ambulatory referral to Dermatology     Requested Prescriptions   Signed Prescriptions Disp Refills   amLODipine (NORVASC) 10 MG tablet 30 tablet 2    Sig: Take 1 tablet (10 mg total) by mouth daily.   hydrochlorothiazide (HYDRODIURIL) 50 MG tablet 30 tablet 2    Sig: Take 1 tablet (50 mg total) by mouth daily.   nystatin (MYCOSTATIN/NYSTOP) powder 60 g 2    Sig: Apply 1 Application topically 3 (three) times daily.   Liraglutide -Weight Management 18 MG/3ML SOPN 6 mL 0    Sig: Inject 0.6 mg into the skin daily for 7 days, THEN 1.2 mg daily for 7 days, THEN 1.8 mg daily for 7 days, THEN 2.4 mg daily for 7 days.    Return in about 2 weeks (around 01/20/2022) for Follow-Up or next available bp check and 4 weeks weight check.  Camillia Herter, NP

## 2022-01-06 ENCOUNTER — Other Ambulatory Visit: Payer: Self-pay | Admitting: Family

## 2022-01-06 ENCOUNTER — Ambulatory Visit: Payer: 59 | Admitting: Family

## 2022-01-06 ENCOUNTER — Encounter: Payer: Self-pay | Admitting: Family

## 2022-01-06 VITALS — BP 156/96 | HR 78 | Temp 98.3°F | Resp 16 | Ht 65.98 in | Wt 220.0 lb

## 2022-01-06 DIAGNOSIS — I10 Essential (primary) hypertension: Secondary | ICD-10-CM

## 2022-01-06 DIAGNOSIS — Z7689 Persons encountering health services in other specified circumstances: Secondary | ICD-10-CM

## 2022-01-06 DIAGNOSIS — B3789 Other sites of candidiasis: Secondary | ICD-10-CM

## 2022-01-06 DIAGNOSIS — Z1231 Encounter for screening mammogram for malignant neoplasm of breast: Secondary | ICD-10-CM

## 2022-01-06 MED ORDER — AMLODIPINE BESYLATE 10 MG PO TABS: 10.0000 mg | ORAL_TABLET | Freq: Every day | ORAL | 2 refills | Status: DC

## 2022-01-06 MED ORDER — NYSTATIN 100000 UNIT/GM EX POWD
1.0000 | Freq: Three times a day (TID) | CUTANEOUS | 2 refills | Status: DC
Start: 2022-01-06 — End: 2022-12-01

## 2022-01-06 MED ORDER — LIRAGLUTIDE -WEIGHT MANAGEMENT 18 MG/3ML ~~LOC~~ SOPN: PEN_INJECTOR | SUBCUTANEOUS | 0 refills | Status: DC

## 2022-01-06 MED ORDER — HYDROCHLOROTHIAZIDE 50 MG PO TABS: 50.0000 mg | ORAL_TABLET | Freq: Every day | ORAL | 2 refills | Status: DC

## 2022-01-06 NOTE — Progress Notes (Signed)
.  Pt presents for hypertension f/u and weight loss options pt would like some Tretinoin 0.025 cream for the rash under breast she has used this in past before.

## 2022-01-08 LAB — BASIC METABOLIC PANEL
BUN/Creatinine Ratio: 12 (ref 9–23)
BUN: 9 mg/dL (ref 6–24)
CO2: 23 mmol/L (ref 20–29)
Calcium: 9.4 mg/dL (ref 8.7–10.2)
Chloride: 100 mmol/L (ref 96–106)
Creatinine, Ser: 0.74 mg/dL (ref 0.57–1.00)
Glucose: 97 mg/dL (ref 70–99)
Potassium: 4.3 mmol/L (ref 3.5–5.2)
Sodium: 139 mmol/L (ref 134–144)
eGFR: 100 mL/min/{1.73_m2} (ref >59)

## 2022-01-11 ENCOUNTER — Telehealth: Payer: Self-pay

## 2022-01-11 NOTE — Telephone Encounter (Signed)
PA started for Saxenda -Covermymeds cannot find patient in system based off insurance profile will need to contact pt Key#[id number]

## 2022-01-12 NOTE — Progress Notes (Signed)
Patient ID: Audrey Avery, female    DOB: 1971/04/03  MRN: [id number]  CC: Blood Pressure Check  Subjective: Audrey Avery is a 51 y.o. female who presents for blood pressure check.   Her concerns today include:  Doing well on blood pressure medications without issues or concerns. She is taking blood pressure medications as prescribed. She is going to call her health insurance today to get more details regarding why Kirke Shaggy has not been approved. She will update me when she gets an explanation.   Patient Active Problem List   Diagnosis Date Noted   Trichomonas vaginitis 03/16/2021   Bacterial vaginitis 03/16/2021   Aerophagia 09/08/2020   Essential hypertension 10/02/2019   Syncope and collapse 09/30/2019   DOE (dyspnea on exertion) 09/30/2019   OSA (obstructive sleep apnea) 08/02/2019   Anemia 08/15/2012   Menorrhagia 08/15/2012   Refusal of blood transfusions as patient is Jehovah's Witness 08/15/2012   Uterine fibroid 08/15/2012     Current Outpatient Medications on File Prior to Visit  Medication Sig Dispense Refill   amLODipine (NORVASC) 10 MG tablet Take 1 tablet (10 mg total) by mouth daily. 30 tablet 2   hydrochlorothiazide (HYDRODIURIL) 50 MG tablet Take 1 tablet (50 mg total) by mouth daily. 30 tablet 2   ibuprofen (ADVIL) 800 MG tablet Take 800 mg by mouth every 6 (six) hours as needed.     Liraglutide -Weight Management 18 MG/3ML SOPN Inject 0.6 mg into the skin daily for 7 days, THEN 1.2 mg daily for 7 days, THEN 1.8 mg daily for 7 days, THEN 2.4 mg daily for 7 days. 6 mL 0   nystatin (MYCOSTATIN/NYSTOP) powder Apply 1 Application topically 3 (three) times daily. 60 g 2   No current facility-administered medications on file prior to visit.    Allergies  Allergen Reactions   Penicillins Anaphylaxis and Swelling    Has patient had a PCN reaction causing immediate rash, facial/tongue/throat swelling, SOB or lightheadedness with hypotension: Yes Has patient  had a PCN reaction causing severe rash involving mucus membranes or skin necrosis: No Has patient had a PCN reaction that required hospitalization No Has patient had a PCN reaction occurring within the last 10 years: No If all of the above answers are "NO", then may proceed with Cephalosporin use.     Social History   Socioeconomic History   Marital status: Single    Spouse name: Not on file   Number of children: Not on file   Years of education: Not on file   Highest education level: Not on file  Occupational History    Employer: [employer]  Tobacco Use   Smoking status: Never    Passive exposure: Never   Smokeless tobacco: Never  Vaping Use   Vaping Use: Never used  Substance and Sexual Activity   Alcohol use: No   Drug use: No   Sexual activity: Yes  Other Topics Concern   Not on file  Social History Narrative   Not on file   Social Determinants of Health   Financial Resource Strain: Not on file  Food Insecurity: Not on file  Transportation Needs: Not on file  Physical Activity: Not on file  Stress: Not on file  Social Connections: Not on file  Intimate Partner Violence: Not on file    Family History  Problem Relation Age of Onset   Cancer Mother    Hypertension Mother    Diabetes Father    Cancer Father  Hypertension Father    Heart attack Father 29   CAD Father 74       She does not know details   Cancer Sister    Hypertension Sister     Past Surgical History:  Procedure Laterality Date   ABDOMINAL HYSTERECTOMY     BREAST SURGERY     CESAREAN SECTION     CHOLECYSTECTOMY     TRANSTHORACIC ECHOCARDIOGRAM  09/2019   EF 60 to 65%.  Mild LVH,  normal valves..  Normal echo    ROS: Review of Systems Negative except as stated above  PHYSICAL EXAM: BP 131/84 (BP Location: Left Arm, Patient Position: Sitting, Cuff Size: Large)   Pulse 88   Temp 98.3 F (36.8 C)   Resp 16   Ht 5' 5.98" (1.676 m)   Wt 229 lb (103.9 kg)   SpO2 94%    BMI 36.98 kg/m   Physical Exam HENT:     Head: Normocephalic and atraumatic.  Eyes:     Extraocular Movements: Extraocular movements intact.     Conjunctiva/sclera: Conjunctivae normal.     Pupils: Pupils are equal, round, and reactive to light.  Cardiovascular:     Rate and Rhythm: Normal rate and regular rhythm.     Pulses: Normal pulses.     Heart sounds: Normal heart sounds.  Pulmonary:     Effort: Pulmonary effort is normal.     Breath sounds: Normal breath sounds.  Musculoskeletal:     Cervical back: Normal range of motion and neck supple.  Neurological:     General: No focal deficit present.     Mental Status: She is alert and oriented to person, place, and time.  Psychiatric:        Mood and Affect: Mood normal.        Behavior: Behavior normal.     ASSESSMENT AND PLAN: 1. Essential (primary) hypertension - Blood pressure improved since last visit.  - Continue Amlodipine and Hydrochlorothiazide as prescribed. No refills needed as of present.  - Counseled on blood pressure goal of less than 130/80, low-sodium, DASH diet, medication compliance, and 150 minutes of moderate intensity exercise per week as tolerated. Counseled on medication adherence and adverse effects. - Follow-up with primary provider in 4 months or sooner if needed.    Patient was given the opportunity to ask questions.  Patient verbalized understanding of the plan and was able to repeat key elements of the plan. Patient was given clear instructions to go to Emergency Department or return to medical center if symptoms don't improve, worsen, or new problems develop.The patient verbalized understanding.    Return in about 4 months (around 05/23/2022) for Follow-Up or next available chronic care mgmt .  Camillia Herter, NP

## 2022-01-14 NOTE — Telephone Encounter (Signed)
Patient called back in stating she is wanting to know the status on her prescription for Sexenda. Please contact patient to assist further.

## 2022-01-17 NOTE — Telephone Encounter (Signed)
Pt contacted to advise that prior authorization was attempted but Covermymeds message-Cannot find matching patient with Name and Date of Birth provided.

## 2022-01-17 NOTE — Telephone Encounter (Signed)
Att to contact pt to advise of information no ans lvm

## 2022-01-21 ENCOUNTER — Telehealth: Payer: Self-pay | Admitting: Family

## 2022-01-21 ENCOUNTER — Ambulatory Visit
Admission: RE | Admit: 2022-01-21 | Discharge: 2022-01-21 | Disposition: A | Discharge status: Home / Self Care | Payer: 59 | Source: Ambulatory Visit | Attending: Family | Admitting: Family

## 2022-01-21 ENCOUNTER — Ambulatory Visit: Payer: 59 | Admitting: Family

## 2022-01-21 VITALS — BP 131/84 | HR 88 | Temp 98.3°F | Resp 16 | Ht 65.98 in | Wt 229.0 lb

## 2022-01-21 DIAGNOSIS — I10 Essential (primary) hypertension: Secondary | ICD-10-CM

## 2022-01-21 DIAGNOSIS — Z1231 Encounter for screening mammogram for malignant neoplasm of breast: Secondary | ICD-10-CM

## 2022-01-21 NOTE — Telephone Encounter (Signed)
Spoke to pt advised that medication prior authorization pending approval Key#[id number]

## 2022-01-21 NOTE — Progress Notes (Signed)
Pt presents for BP check

## 2022-01-27 ENCOUNTER — Encounter: Payer: Self-pay | Admitting: Family

## 2022-03-02 ENCOUNTER — Other Ambulatory Visit: Payer: Self-pay

## 2022-03-02 ENCOUNTER — Other Ambulatory Visit (HOSPITAL_COMMUNITY): Payer: Self-pay

## 2022-03-02 DIAGNOSIS — Z7689 Persons encountering health services in other specified circumstances: Secondary | ICD-10-CM

## 2022-03-02 MED ORDER — LIRAGLUTIDE -WEIGHT MANAGEMENT 18 MG/3ML ~~LOC~~ SOPN
PEN_INJECTOR | SUBCUTANEOUS | 0 refills | Status: AC
  Filled 2022-03-02: qty 6, 25d supply, fill #0

## 2022-03-11 ENCOUNTER — Emergency Department (HOSPITAL_COMMUNITY)
Admission: EM | Admit: 2022-03-11 | Discharge: 2022-03-11 | Disposition: A | Discharge status: Home / Self Care | Payer: 59 | Attending: Emergency Medicine | Admitting: Emergency Medicine

## 2022-03-11 ENCOUNTER — Other Ambulatory Visit: Payer: Self-pay

## 2022-03-11 ENCOUNTER — Encounter (HOSPITAL_COMMUNITY): Payer: Self-pay

## 2022-03-11 DIAGNOSIS — I10 Essential (primary) hypertension: Secondary | ICD-10-CM | POA: Diagnosis not present

## 2022-03-11 DIAGNOSIS — M791 Myalgia, unspecified site: Secondary | ICD-10-CM | POA: Diagnosis present

## 2022-03-11 DIAGNOSIS — Z20822 Contact with and (suspected) exposure to covid-19: Secondary | ICD-10-CM | POA: Diagnosis not present

## 2022-03-11 DIAGNOSIS — J069 Acute upper respiratory infection, unspecified: Secondary | ICD-10-CM | POA: Diagnosis not present

## 2022-03-11 DIAGNOSIS — Z79899 Other long term (current) drug therapy: Secondary | ICD-10-CM | POA: Insufficient documentation

## 2022-03-11 LAB — RESP PANEL BY RT-PCR (FLU A&B, COVID) ARPGX2
Influenza A by PCR: NEGATIVE
Influenza B by PCR: NEGATIVE
SARS Coronavirus 2 by RT PCR: NEGATIVE

## 2022-03-11 NOTE — ED Provider Notes (Signed)
[name]   CSN: [id number] Arrival date & time: 03/11/22  1035     History  Chief Complaint  Patient presents with   Fever   Cough    Audrey Avery is a 51 y.o. female.  Patient presents to the hospital complaining of body aches, chills, cough, and headache which began yesterday afternoon.  Patient states just prior to the onset of those she had a scratchy throat.  At this time she denies sore throat, sneezing.  She has a 100.2 F temperature in triage.  The patient states that she has no known sick exposures.  She denies shortness of breath, chest pain, urinary symptoms.  Past medical history significant for PSVT, chronic cough, hypertension, anemia  HPI     Home Medications Prior to Admission medications   Medication Sig Start Date End Date Taking? Authorizing Provider  amLODipine (NORVASC) 10 MG tablet Take 1 tablet (10 mg total) by mouth daily. 01/06/22 04/06/22  Camillia Herter, NP  hydrochlorothiazide (HYDRODIURIL) 50 MG tablet Take 1 tablet (50 mg total) by mouth daily. 01/06/22 04/06/22  Camillia Herter, NP  ibuprofen (ADVIL) 800 MG tablet Take 800 mg by mouth every 6 (six) hours as needed. 09/26/20   [provider]  Liraglutide -Weight Management 18 MG/3ML SOPN Inject 0.6 mg into the skin daily for 7 days, THEN 1.2 mg daily for 7 days, THEN 1.8 mg daily for 7 days, THEN 2.4 mg daily for 7 days. 03/02/22 03/30/22  Camillia Herter, NP  nystatin (MYCOSTATIN/NYSTOP) powder Apply 1 Application topically 3 (three) times daily. 01/06/22   Camillia Herter, NP      Allergies    Penicillins    Review of Systems   Review of Systems  Constitutional:  Positive for chills and fever.  HENT:  Negative for sore throat.   Respiratory:  Positive for cough. Negative for shortness of breath.   Cardiovascular:  Negative for chest pain.  Gastrointestinal:  Negative for abdominal pain, nausea and vomiting.  Genitourinary:   Negative for dysuria.    Physical Exam Updated Vital Signs BP (!) 163/94 (BP Location: Left Arm)   Pulse 88   Temp 100.2 F (37.9 C) (Oral) Comment: I made nurse aware  Resp 20   SpO2 100%  Physical Exam Vitals and nursing note reviewed.  Constitutional:      General: She is not in acute distress.    Appearance: She is well-developed.  HENT:     Head: Normocephalic and atraumatic.     Mouth/Throat:     Mouth: Mucous membranes are moist.     Pharynx: Oropharynx is clear. Uvula midline. No oropharyngeal exudate.     Tonsils: No tonsillar exudate.  Eyes:     Conjunctiva/sclera: Conjunctivae normal.  Cardiovascular:     Rate and Rhythm: Normal rate and regular rhythm.     Heart sounds: No murmur heard. Pulmonary:     Effort: Pulmonary effort is normal. No respiratory distress.     Breath sounds: Normal breath sounds.  Abdominal:     Palpations: Abdomen is soft.     Tenderness: There is no abdominal tenderness.  Musculoskeletal:        General: No swelling.     Cervical back: Neck supple.  Skin:    General: Skin is warm and dry.     Capillary Refill: Capillary refill takes less than 2 seconds.  Neurological:     Mental Status: She is alert.  Psychiatric:        Mood and Affect: Mood normal.     ED Results / Procedures / Treatments   Labs (all labs ordered are listed, but only abnormal results are displayed) Labs Reviewed  RESP PANEL BY RT-PCR (FLU A&B, COVID) ARPGX2    EKG None  Radiology No results found.  Procedures Procedures    Medications Ordered in ED Medications - No data to display  ED Course/ Medical Decision Making/ A&P                           Medical Decision Making  Patient presents with a chief complaint of body aches, cough, chills.  Differential diagnosis includes but is not limited to COVID-19, influenza, other viral illnesses, and others  I reviewed the patient's past medical history and found no relevant documents.  I ordered  and reviewed labs.  Patient was negative for SARS coronavirus 2, influenza A, influenza B  The patient has no neck stiffness, no photophobia to suggest any sort of meningitis etiology.  The patient is febrile but is negative for COVID and influenza testing.  Oropharynx does not appear suggestive of a strep etiology and the patient is not complaining of sore throat at this time.  Lungs are clear to auscultation with no signs of pneumonia at this time.  This appears to be some sort of viral upper respiratory illness.  Supportive care discussed with patient.  She may use over-the-counter medications as needed.  Patient understands they will take her body some time to fight the illness.  Discharge home        Final Clinical Impression(s) / ED Diagnoses Final diagnoses:  Viral upper respiratory tract infection    Rx / DC Orders ED Discharge Orders     None         Ronny Bacon 03/11/22 1211    Sherwood Gambler, MD 03/15/22 1610

## 2022-03-11 NOTE — ED Triage Notes (Signed)
Pt c/o fever, cough, and "just do not feel good" since yesterday evening. Pt took Nyquil last night with some relief.

## 2022-03-11 NOTE — Discharge Instructions (Signed)
You were seen today for an upper respiratory infection.  Your testing was negative for COVID-19 and influenza at this time.  Please take over-the-counter medications for fever and pain control as needed.  Hydrate as you are able.  Follow-up with your primary care provider as needed.  If you develop any life-threatening additions please return to the emergency department

## 2022-05-02 ENCOUNTER — Encounter: Payer: Self-pay | Admitting: Family

## 2022-05-17 NOTE — Progress Notes (Signed)
Patient ID: Audrey Avery, female    DOB: April 17, 1971  MRN: [id number]  CC: Chronic Care Management   Subjective: Audrey Avery is a 51 y.o. female who presents for chronic care management.   Her concerns today include:  - Doing well on blood pressure medications, no issues/concerns.  - History of diabetes. Was taking Trulicity which helped with weight loss. Not currently taking any diabetic medications.  - Sinus/nasal congestion for several days. Using over-the-counter medications without much relief.  - Reports doing well on Saxenda for weight management, denies side effects. However, concerned because she hasn't lost weight since last visit. Usually eating a small portion once daily at 6 pm. She does not exercise but plans to begin regimen soon.  Patient Active Problem List   Diagnosis Date Noted   Essential hypertension 10/02/2019   Syncope and collapse 09/30/2019   DOE (dyspnea on exertion) 09/30/2019   OSA (obstructive sleep apnea) 08/02/2019   Anemia 08/15/2012   Menorrhagia 08/15/2012   Refusal of blood transfusions as patient is Jehovah's Witness 08/15/2012     Current Outpatient Medications on File Prior to Visit  Medication Sig Dispense Refill   ibuprofen (ADVIL) 800 MG tablet Take 800 mg by mouth every 6 (six) hours as needed.     nystatin (MYCOSTATIN/NYSTOP) powder Apply 1 Application topically 3 (three) times daily. 60 g 2   No current facility-administered medications on file prior to visit.    Allergies  Allergen Reactions   Penicillins Anaphylaxis and Swelling    Has patient had a PCN reaction causing immediate rash, facial/tongue/throat swelling, SOB or lightheadedness with hypotension: Yes Has patient had a PCN reaction causing severe rash involving mucus membranes or skin necrosis: No Has patient had a PCN reaction that required hospitalization No Has patient had a PCN reaction occurring within the last 10 years: No If all of the above answers are  "NO", then may proceed with Cephalosporin use.     Social History   Socioeconomic History   Marital status: Single    Spouse name: Not on file   Number of children: Not on file   Years of education: Not on file   Highest education level: Not on file  Occupational History    Employer: [employer]  Tobacco Use   Smoking status: Never    Passive exposure: Never   Smokeless tobacco: Never  Vaping Use   Vaping Use: Never used  Substance and Sexual Activity   Alcohol use: No   Drug use: No   Sexual activity: Yes  Other Topics Concern   Not on file  Social History Narrative   Not on file   Social Determinants of Health   Financial Resource Strain: Not on file  Food Insecurity: Not on file  Transportation Needs: Not on file  Physical Activity: Not on file  Stress: Not on file  Social Connections: Not on file  Intimate Partner Violence: Not on file    Family History  Problem Relation Age of Onset   Cancer Mother    Hypertension Mother    Diabetes Father    Cancer Father    Hypertension Father    Heart attack Father 34   CAD Father 74       She does not know details   Cancer Sister    Hypertension Sister     Past Surgical History:  Procedure Laterality Date   ABDOMINAL HYSTERECTOMY     BREAST SURGERY     CESAREAN  SECTION     CHOLECYSTECTOMY     TRANSTHORACIC ECHOCARDIOGRAM  09/2019   EF 60 to 65%.  Mild LVH,  normal valves..  Normal echo    ROS: Review of Systems Negative except as stated above  PHYSICAL EXAM: BP 124/87 (BP Location: Left Arm, Patient Position: Sitting, Cuff Size: Large)   Pulse 79   Temp 98.3 F (36.8 C)   Resp 16   Ht 5' 5.98" (1.676 m)   Wt 230 lb (104.3 kg)   SpO2 98%   BMI 37.14 kg/m   Wt Readings from Last 3 Encounters:  05/20/22 230 lb (104.3 kg)  01/21/22 229 lb (103.9 kg)  01/06/22 220 lb (99.8 kg)    Physical Exam HENT:     Head: Normocephalic and atraumatic.  Eyes:     Extraocular Movements:  Extraocular movements intact.     Conjunctiva/sclera: Conjunctivae normal.     Pupils: Pupils are equal, round, and reactive to light.  Cardiovascular:     Rate and Rhythm: Normal rate and regular rhythm.     Pulses: Normal pulses.     Heart sounds: Normal heart sounds.  Pulmonary:     Effort: Pulmonary effort is normal.     Breath sounds: Normal breath sounds.  Musculoskeletal:     Cervical back: Normal range of motion and neck supple.  Neurological:     General: No focal deficit present.     Mental Status: She is alert and oriented to person, place, and time.  Psychiatric:        Mood and Affect: Mood normal.        Behavior: Behavior normal.    ASSESSMENT AND PLAN: 1. Primary hypertension - Continue Amlodipine and Hydrochlorothiazide as prescribed.  - Counseled on blood pressure goal of less than 130/80, low-sodium, DASH diet, medication compliance, and 150 minutes of moderate intensity exercise per week as tolerated. Counseled on medication adherence and adverse effects. - Follow-up with primary provider in 3 months or sooner if needed.  - amLODipine (NORVASC) 10 MG tablet; Take 1 tablet (10 mg total) by mouth daily.  Dispense: 30 tablet; Refill: 2 - hydrochlorothiazide (HYDRODIURIL) 50 MG tablet; Take 1 tablet (50 mg total) by mouth daily.  Dispense: 30 tablet; Refill: 2  2. History of type 2 diabetes mellitus 3. Diabetes mellitus screening - Routine screening.  - Hemoglobin A1c  4. Screening cholesterol level - Routine screening.  - Lipid Panel  5. Upper respiratory symptom 6. Congestion of nasal sinus - Prednisone as prescribed. Counseled on medication adherence.  - Respiratory panel pending.  - Follow-up with primary provider as scheduled. - predniSONE (DELTASONE) 10 MG tablet; Take 6 tablets (60 mg total) by mouth daily with breakfast for 1 day, THEN 5 tablets (50 mg total) daily with breakfast for 1 day, THEN 4 tablets (40 mg total) daily with breakfast for 1 day,  THEN 3 tablets (30 mg total) daily with breakfast for 1 day, THEN 2 tablets (20 mg total) daily with breakfast for 1 day, THEN 1 tablet (10 mg total) daily with breakfast for 1 day.  Dispense: 21 tablet; Refill: 0 - COVID-19, Flu A+B and RSV  7. Encounter for weight management - Increase Liraglutide-Weight Management to 3 mg dose as prescribed.  - Discussed with patient importance of balanced meals and routine exercise as tolerated.  - Patient declined referral to Medical Weight Management/dietician.  - Follow-up with primary provider in 4 weeks or sooner if needed for weight check. - Liraglutide -Weight  Management 18 MG/3ML SOPN; Inject 3 mg into the skin daily.  Dispense: 15 mL; Refill: 0   Patient was given the opportunity to ask questions.  Patient verbalized understanding of the plan and was able to repeat key elements of the plan. Patient was given clear instructions to go to Emergency Department or return to medical center if symptoms don't improve, worsen, or new problems develop.The patient verbalized understanding.   Orders Placed This Encounter  Procedures   COVID-19, Flu A+B and RSV   Lipid Panel   Hemoglobin A1c     Requested Prescriptions   Signed Prescriptions Disp Refills   amLODipine (NORVASC) 10 MG tablet 30 tablet 2    Sig: Take 1 tablet (10 mg total) by mouth daily.   hydrochlorothiazide (HYDRODIURIL) 50 MG tablet 30 tablet 2    Sig: Take 1 tablet (50 mg total) by mouth daily.   predniSONE (DELTASONE) 10 MG tablet 21 tablet 0    Sig: Take 6 tablets (60 mg total) by mouth daily with breakfast for 1 day, THEN 5 tablets (50 mg total) daily with breakfast for 1 day, THEN 4 tablets (40 mg total) daily with breakfast for 1 day, THEN 3 tablets (30 mg total) daily with breakfast for 1 day, THEN 2 tablets (20 mg total) daily with breakfast for 1 day, THEN 1 tablet (10 mg total) daily with breakfast for 1 day.   Liraglutide -Weight Management 18 MG/3ML SOPN 15 mL 0    Sig:  Inject 3 mg into the skin daily.    Return in about 3 months (around 08/19/2022) for Follow-Up or next available chronic care mgmt and 4 weeks weight check.  Camillia Herter, NP

## 2022-05-20 ENCOUNTER — Ambulatory Visit: Payer: 59 | Admitting: Family

## 2022-05-20 ENCOUNTER — Encounter: Payer: Self-pay | Admitting: Family

## 2022-05-20 VITALS — BP 124/87 | HR 79 | Temp 98.3°F | Resp 16 | Ht 65.98 in | Wt 230.0 lb

## 2022-05-20 DIAGNOSIS — Z7689 Persons encountering health services in other specified circumstances: Secondary | ICD-10-CM

## 2022-05-20 DIAGNOSIS — R0981 Nasal congestion: Secondary | ICD-10-CM | POA: Diagnosis not present

## 2022-05-20 DIAGNOSIS — E6609 Other obesity due to excess calories: Secondary | ICD-10-CM | POA: Diagnosis not present

## 2022-05-20 DIAGNOSIS — R0989 Other specified symptoms and signs involving the circulatory and respiratory systems: Secondary | ICD-10-CM

## 2022-05-20 DIAGNOSIS — Z8639 Personal history of other endocrine, nutritional and metabolic disease: Secondary | ICD-10-CM

## 2022-05-20 DIAGNOSIS — I10 Essential (primary) hypertension: Secondary | ICD-10-CM | POA: Diagnosis not present

## 2022-05-20 DIAGNOSIS — Z13220 Encounter for screening for lipoid disorders: Secondary | ICD-10-CM | POA: Diagnosis not present

## 2022-05-20 DIAGNOSIS — Z131 Encounter for screening for diabetes mellitus: Secondary | ICD-10-CM | POA: Diagnosis not present

## 2022-05-20 DIAGNOSIS — Z6837 Body mass index (BMI) 37.0-37.9, adult: Secondary | ICD-10-CM

## 2022-05-20 MED ORDER — HYDROCHLOROTHIAZIDE 50 MG PO TABS: 50.0000 mg | ORAL_TABLET | Freq: Every day | ORAL | 2 refills | Status: DC

## 2022-05-20 MED ORDER — PREDNISONE 10 MG PO TABS: ORAL_TABLET | ORAL | 0 refills | Status: AC

## 2022-05-20 MED ORDER — AMLODIPINE BESYLATE 10 MG PO TABS: 10.0000 mg | ORAL_TABLET | Freq: Every day | ORAL | 2 refills | Status: DC

## 2022-05-20 MED ORDER — LIRAGLUTIDE -WEIGHT MANAGEMENT 18 MG/3ML ~~LOC~~ SOPN: 3.0000 mg | PEN_INJECTOR | Freq: Every day | SUBCUTANEOUS | 0 refills | Status: AC

## 2022-05-20 NOTE — Progress Notes (Signed)
.  Pt presents for chronic care management   -sinus congestion -using Afrin  -states that she wants to try the CPAP machine she seen on Facebook, adv she would need to speak w/Pulmonologist in regards to the machine

## 2022-05-21 ENCOUNTER — Other Ambulatory Visit: Payer: Self-pay | Admitting: Family

## 2022-05-21 DIAGNOSIS — Z1211 Encounter for screening for malignant neoplasm of colon: Secondary | ICD-10-CM

## 2022-05-21 DIAGNOSIS — E785 Hyperlipidemia, unspecified: Secondary | ICD-10-CM | POA: Insufficient documentation

## 2022-05-21 LAB — LIPID PANEL
Chol/HDL Ratio: 3.3 ratio (ref 0.0–4.4)
Cholesterol, Total: 243 mg/dL — ABNORMAL HIGH (ref 100–199)
HDL: 74 mg/dL (ref >39)
LDL Chol Calc (NIH): 160 mg/dL — ABNORMAL HIGH (ref 0–99)
Triglycerides: 58 mg/dL (ref 0–149)
VLDL Cholesterol Cal: 9 mg/dL (ref 5–40)

## 2022-05-21 LAB — HEMOGLOBIN A1C
Est. average glucose Bld gHb Est-mCnc: 131 mg/dL
Hgb A1c MFr Bld: 6.2 % — ABNORMAL HIGH (ref 4.8–5.6)

## 2022-05-21 MED ORDER — ATORVASTATIN CALCIUM 20 MG PO TABS: 20.0000 mg | ORAL_TABLET | Freq: Every day | ORAL | 2 refills | Status: DC

## 2022-05-22 LAB — COVID-19, FLU A+B AND RSV
Influenza A, NAA: NOT DETECTED
Influenza B, NAA: NOT DETECTED
RSV, NAA: NOT DETECTED
SARS-CoV-2, NAA: NOT DETECTED

## 2022-07-07 ENCOUNTER — Emergency Department (HOSPITAL_COMMUNITY)
Admission: EM | Admit: 2022-07-07 | Discharge: 2022-07-07 | Disposition: A | Discharge status: Home / Self Care | Payer: 59 | Attending: Emergency Medicine | Admitting: Emergency Medicine

## 2022-07-07 ENCOUNTER — Emergency Department (HOSPITAL_COMMUNITY): Payer: 59

## 2022-07-07 DIAGNOSIS — Z79899 Other long term (current) drug therapy: Secondary | ICD-10-CM | POA: Insufficient documentation

## 2022-07-07 DIAGNOSIS — R911 Solitary pulmonary nodule: Secondary | ICD-10-CM | POA: Diagnosis not present

## 2022-07-07 DIAGNOSIS — R109 Unspecified abdominal pain: Secondary | ICD-10-CM | POA: Diagnosis present

## 2022-07-07 DIAGNOSIS — I10 Essential (primary) hypertension: Secondary | ICD-10-CM | POA: Insufficient documentation

## 2022-07-07 LAB — COMPREHENSIVE METABOLIC PANEL
ALT: 18 U/L (ref 0–44)
AST: 21 U/L (ref 15–41)
Albumin: 3.6 g/dL (ref 3.5–5.0)
Alkaline Phosphatase: 73 U/L (ref 38–126)
Anion gap: 9 (ref 5–15)
BUN: 12 mg/dL (ref 6–20)
CO2: 26 mmol/L (ref 22–32)
Calcium: 8.7 mg/dL — ABNORMAL LOW (ref 8.9–10.3)
Chloride: 104 mmol/L (ref 98–111)
Creatinine, Ser: 0.56 mg/dL (ref 0.44–1.00)
GFR, Estimated: >60 mL/min (ref >60)
Glucose, Bld: 106 mg/dL — ABNORMAL HIGH (ref 70–99)
Potassium: 3.6 mmol/L (ref 3.5–5.1)
Sodium: 139 mmol/L (ref 135–145)
Total Bilirubin: 0.5 mg/dL (ref 0.3–1.2)
Total Protein: 8.5 g/dL — ABNORMAL HIGH (ref 6.5–8.1)

## 2022-07-07 LAB — URINALYSIS, ROUTINE W REFLEX MICROSCOPIC
Bilirubin Urine: NEGATIVE
Glucose, UA: NEGATIVE mg/dL
Hgb urine dipstick: NEGATIVE
Ketones, ur: NEGATIVE mg/dL
Leukocytes,Ua: NEGATIVE
Nitrite: NEGATIVE
Protein, ur: NEGATIVE mg/dL
Specific Gravity, Urine: 1.026 (ref 1.005–1.030)
pH: 5 (ref 5.0–8.0)

## 2022-07-07 LAB — CBC WITH DIFFERENTIAL/PLATELET
Abs Immature Granulocytes: 0.02 10*3/uL (ref 0.00–0.07)
Basophils Absolute: 0.1 10*3/uL (ref 0.0–0.1)
Basophils Relative: 1 %
Eosinophils Absolute: 0.1 10*3/uL (ref 0.0–0.5)
Eosinophils Relative: 1 %
HCT: 38.3 % (ref 36.0–46.0)
Hemoglobin: 11.8 g/dL — ABNORMAL LOW (ref 12.0–15.0)
Immature Granulocytes: 0 %
Lymphocytes Relative: 31 %
Lymphs Abs: 2.3 10*3/uL (ref 0.7–4.0)
MCH: 24.8 pg — ABNORMAL LOW (ref 26.0–34.0)
MCHC: 30.8 g/dL (ref 30.0–36.0)
MCV: 80.5 fL (ref 80.0–100.0)
Monocytes Absolute: 0.5 10*3/uL (ref 0.1–1.0)
Monocytes Relative: 7 %
Neutro Abs: 4.4 10*3/uL (ref 1.7–7.7)
Neutrophils Relative %: 60 %
Platelets: 345 10*3/uL (ref 150–400)
RBC: 4.76 MIL/uL (ref 3.87–5.11)
RDW: 14.9 % (ref 11.5–15.5)
WBC: 7.5 10*3/uL (ref 4.0–10.5)
nRBC: 0 % (ref 0.0–0.2)

## 2022-07-07 LAB — LIPASE, BLOOD: Lipase: 27 U/L (ref 11–51)

## 2022-07-07 MED ORDER — KETOROLAC TROMETHAMINE 60 MG/2ML IM SOLN
15.0000 mg | Freq: Once | INTRAMUSCULAR | Status: AC
  Administered 2022-07-07: 15 mg via INTRAMUSCULAR
  Filled 2022-07-07: qty 2

## 2022-07-07 MED ORDER — MELOXICAM 7.5 MG PO TABS: 7.5000 mg | ORAL_TABLET | Freq: Every day | ORAL | 0 refills | Status: DC

## 2022-07-07 MED ORDER — LIDOCAINE 5 % EX PTCH
1.0000 | MEDICATED_PATCH | CUTANEOUS | Status: DC
  Administered 2022-07-07: 1 via TRANSDERMAL
  Filled 2022-07-07: qty 1

## 2022-07-07 NOTE — ED Provider Notes (Signed)
[facility] DEPT Provider Note   CSN: [id number] Arrival date & time: 07/07/22  1417     History  Chief Complaint  Patient presents with   Flank Pain    Audrey Avery is a 52 y.o. female.  Past history of anemia, hypertension, chronic cough.  She is complaining of pain to right lateral ribs and right mid back for the past couple of days.  She states she is moving and has been lifting some boxes but denies any injury or trauma.  The urinary complaints, pain is worse with range of motion. Notes that she has significant URI with severe cough that had resolved just prior to the onset of the pain and thinks that could have been due to pulled muscle from coughing is no abdominal pain nausea or vomiting.   Flank Pain       Home Medications Prior to Admission medications   Medication Sig Start Date End Date Taking? Authorizing Provider  atorvastatin (LIPITOR) 20 MG tablet Take 1 tablet (20 mg total) by mouth daily. 05/21/22 08/19/22  Camillia Herter, NP  meloxicam (MOBIC) 7.5 MG tablet Take 1 tablet (7.5 mg total) by mouth daily. 07/07/22  Yes Nicholus Chandran A, PA-C  amLODipine (NORVASC) 10 MG tablet Take 1 tablet (10 mg total) by mouth daily. 05/20/22 08/18/22  Camillia Herter, NP  hydrochlorothiazide (HYDRODIURIL) 50 MG tablet Take 1 tablet (50 mg total) by mouth daily. 05/20/22 08/18/22  Camillia Herter, NP  ibuprofen (ADVIL) 800 MG tablet Take 800 mg by mouth every 6 (six) hours as needed. 09/26/20   [provider]  nystatin (MYCOSTATIN/NYSTOP) powder Apply 1 Application topically 3 (three) times daily. 01/06/22   Camillia Herter, NP      Allergies    Penicillins    Review of Systems   Review of Systems  Genitourinary:  Positive for flank pain. Negative for frequency, hematuria and pelvic pain.    Physical Exam Updated Vital Signs BP (!) 127/92   Pulse 65   Temp 98.2 F (36.8 C) (Oral)   Resp 16   SpO2 100%  Physical Exam Vitals and  nursing note reviewed.  Constitutional:      General: She is not in acute distress.    Appearance: She is well-developed.  HENT:     Head: Normocephalic and atraumatic.  Eyes:     Conjunctiva/sclera: Conjunctivae normal.  Cardiovascular:     Rate and Rhythm: Normal rate and regular rhythm.     Heart sounds: No murmur heard. Pulmonary:     Effort: Pulmonary effort is normal. No respiratory distress.     Breath sounds: Normal breath sounds.  Abdominal:     General: There is no distension.     Palpations: Abdomen is soft. There is no mass.     Tenderness: There is no abdominal tenderness. There is no right CVA tenderness, left CVA tenderness, guarding or rebound.  Musculoskeletal:        General: No swelling.     Cervical back: Neck supple.  Skin:    General: Skin is warm and dry.     Capillary Refill: Capillary refill takes less than 2 seconds.  Neurological:     Mental Status: She is alert.  Psychiatric:        Mood and Affect: Mood normal.     ED Results / Procedures / Treatments   Labs (all labs ordered are listed, but only abnormal results are displayed) Labs Reviewed  COMPREHENSIVE  METABOLIC PANEL - Abnormal; Notable for the following components:      Result Value   Glucose, Bld 106 (*)    Calcium 8.7 (*)    Total Protein 8.5 (*)    All other components within normal limits  CBC WITH DIFFERENTIAL/PLATELET - Abnormal; Notable for the following components:   Hemoglobin 11.8 (*)    MCH 24.8 (*)    All other components within normal limits  LIPASE, BLOOD  URINALYSIS, ROUTINE W REFLEX MICROSCOPIC    EKG None  Radiology CT Renal Stone Study  Result Date: 07/07/2022 CLINICAL DATA:  Flank pain.  Right flank pain for 4 days. EXAM: CT ABDOMEN AND PELVIS WITHOUT CONTRAST TECHNIQUE: Multidetector CT imaging of the abdomen and pelvis was performed following the standard protocol without IV contrast. RADIATION DOSE REDUCTION: This exam was performed according to the  departmental dose-optimization program which includes automated exposure control, adjustment of the mA and/or kV according to patient size and/or use of iterative reconstruction technique. COMPARISON:  CT chest 07/13/2017 FINDINGS: Lower chest: Right posterior subpleural nodule measuring 3 mm diameter. No change. Hepatobiliary: No focal liver abnormality is seen. Status post cholecystectomy. No biliary dilatation. Pancreas: Unremarkable. No pancreatic ductal dilatation or surrounding inflammatory changes. Spleen: Normal in size without focal abnormality. Adrenals/Urinary Tract: Adrenal glands are unremarkable. Kidneys are normal, without renal calculi, focal lesion, or hydronephrosis. Bladder is unremarkable. Stomach/Bowel: Stomach is within normal limits. Appendix appears normal. No evidence of bowel wall thickening, distention, or inflammatory changes. Vascular/Lymphatic: Normal caliber abdominal aorta. Moderately prominent lymph nodes in the mesentery and right lower quadrant with mild stranding. This could represent reactive or inflammatory nodes. Lymphadenopathy in the external iliac chains bilaterally in the pelvis. Largest nodes measure up to 1.2 cm short axis dimension. Reproductive: Status post hysterectomy. No adnexal masses. Other: No free air or free fluid in the abdomen. Small periumbilical hernia containing fat. Musculoskeletal: No acute or significant osseous findings. IMPRESSION: 1. No renal or ureteral stone or obstruction. 2. No evidence of bowel obstruction or inflammation. 3. Nonspecific lymphadenopathy in the pelvis with mild prominence of mesenteric lymph nodes. 4. 3 mm right solid pulmonary nodule. Per Fleischner Society Guidelines, no routine follow-up imaging is recommended. These guidelines do not apply to immunocompromised patients and patients with cancer. Follow up in patients with significant comorbidities as clinically warranted. For lung cancer screening, adhere to Lung-RADS  guidelines. Reference: Radiology. 2017; 284(1):228-43. Electronically Signed   By: Lucienne Capers M.D.   On: 07/07/2022 16:14    Procedures Procedures    Medications Ordered in ED Medications  ketorolac (TORADOL) injection 15 mg (15 mg Intramuscular Given 07/07/22 1800)    ED Course/ Medical Decision Making/ A&P                             Medical Decision Making This patient presents to the ED for concern of right flank pain, this involves an extensive number of treatment options, and is a complaint that carries with it a high risk of complications and morbidity.  The differential diagnosis includes, kidney stone muscle strain, nephritis, appendicitis, other   Co morbidities that complicate the patient evaluation  Obesity, chronic cough, pretension   Additional history obtained:  Additional history obtained from EMR External records from outside source obtained and reviewed including outpatient visits   Lab Tests:  I Ordered, and personally interpreted labs.  The pertinent results include: Female with hemoglobin 11.8 otherwise overall very reassuring,  no UTI or hematuria   Imaging Studies ordered:  I ordered imaging studies including the CT abdomen pelvis I independently visualized and interpreted imaging which showed mesenteric adenopathy with mild surrounding stranding-has no abdominal tenderness and I will do not think this is the cause of her pain.  She is reporting findings and outpatient follow-up though no specific treatment is needed at this time  Also showed pulmonary nodule which no specific follow-up was recommended patient also advised of this finding. I agree with the radiologist interpretation     Problem List / ED Course / Critical interventions / Medication management  Leg pain, this seems musculoskeletal in nature, labs and CT were reassuring.  She did have some mesenteric adenopathy but has no abdominal tenderness and this seems to be unrelated.  She  was advised of the finding and outpatient follow-up though will likely resolve on its own.  Feeling much better after Toradol, vitals are reassuring, advised on follow-up and return precautions. I ordered medication including ketorolac for pain Reevaluation of the patient after these medicines showed that the patient improved I have reviewed the patients home medicines and have made adjustments as needed    Risk Prescription drug management.           Final Clinical Impression(s) / ED Diagnoses Final diagnoses:  Right flank pain    Rx / DC Orders ED Discharge Orders          Ordered    meloxicam (MOBIC) 7.5 MG tablet  Daily        07/07/22 1813              Gwenevere Abbot, PA-C 07/07/22 2356    Margette Fast, MD 07/14/22 [phone]

## 2022-07-07 NOTE — Discharge Instructions (Addendum)
You were seen today for pain in your back and right upper abdomen area. You CT showed a small nodule in your lung that does not need any additional follow up per the radiologist report.   You also had some ,mildly inflamed lymph nodes in your abdomen which can cause pain, but do not seem to be in the area you are having pain and In general are not dangerous and should go away on their own, though you should be reevaluated by your PCP to make sure you aren't having any worsened pain in this area.  Follow up closely with your PCP, come back to the ER for any new or worsening symptoms, especially fever, vomiting or worsened pain.

## 2022-07-07 NOTE — ED Triage Notes (Signed)
Pt c/o R sided flank pain radiating to her lower back. Pt denies any known injury/trauma. No urinary symptoms per pt.

## 2022-07-07 NOTE — ED Provider Triage Note (Signed)
Emergency Medicine Provider Triage Evaluation Note  Audrey Avery , a 52 y.o. female  was evaluated in triage.  Pt complains of R flank pain present for 4 days.  Patient has tight Tylenol but that did not relieve symptoms.  Patient states pain is constant but exacerbated with movements.  Pain is 10/10 dull.  Patient does deny chest pain, shortness of breath, dysuria, hematuria, changes in sensation/motor skills, fever.  Review of Systems  Positive: See HPI Negative: See HPI  Physical Exam  BP (!) 170/110 (BP Location: Left Arm)   Pulse 83   Temp 98 F (36.7 C)   Resp 12   SpO2 99%  Gen:   Awake, no distress   Resp:  Normal effort  MSK:   Moves extremities without difficulty  Abd:  no TTP Back:   Negative CVA tenderness Skin:  No rash  Medical Decision Making  Medically screening exam initiated at 2:52 PM.  Appropriate orders placed.  [name] was informed that the remainder of the evaluation will be completed by another provider, this initial triage assessment does not replace that evaluation, and the importance of remaining in the ED until their evaluation is complete.   Chuck Hint, PA-C 07/07/22 1501

## 2022-07-14 ENCOUNTER — Ambulatory Visit: Payer: 59 | Admitting: Family

## 2022-07-22 NOTE — Progress Notes (Unsigned)
Patient ID: LINDIE ROBERSON, female    DOB: [birth year]  MRN: [id number]  CC: Dizziness  Subjective: Audrey Avery is a 52 y.o. female who presents for dizziness.   Her concerns today include: ***  Patient Active Problem List   Diagnosis Date Noted   Hyperlipidemia 05/21/2022   Essential hypertension 10/02/2019   Syncope and collapse 09/30/2019   DOE (dyspnea on exertion) 09/30/2019   OSA (obstructive sleep apnea) 08/02/2019   Anemia 08/15/2012   Menorrhagia 08/15/2012   Refusal of blood transfusions as patient is Jehovah's Witness 08/15/2012     Current Outpatient Medications on File Prior to Visit  Medication Sig Dispense Refill   atorvastatin (LIPITOR) 20 MG tablet Take 1 tablet (20 mg total) by mouth daily. 30 tablet 2   amLODipine (NORVASC) 10 MG tablet Take 1 tablet (10 mg total) by mouth daily. 30 tablet 2   hydrochlorothiazide (HYDRODIURIL) 50 MG tablet Take 1 tablet (50 mg total) by mouth daily. 30 tablet 2   ibuprofen (ADVIL) 800 MG tablet Take 800 mg by mouth every 6 (six) hours as needed.     meloxicam (MOBIC) 7.5 MG tablet Take 1 tablet (7.5 mg total) by mouth daily. 5 tablet 0   nystatin (MYCOSTATIN/NYSTOP) powder Apply 1 Application topically 3 (three) times daily. 60 g 2   No current facility-administered medications on file prior to visit.    Allergies  Allergen Reactions   Penicillins Anaphylaxis and Swelling    Has patient had a PCN reaction causing immediate rash, facial/tongue/throat swelling, SOB or lightheadedness with hypotension: Yes Has patient had a PCN reaction causing severe rash involving mucus membranes or skin necrosis: No Has patient had a PCN reaction that required hospitalization No Has patient had a PCN reaction occurring within the last 10 years: No If all of the above answers are "NO", then may proceed with Cephalosporin use.     Social History   Socioeconomic History   Marital status: Single    Spouse name: Not on file    Number of children: Not on file   Years of education: Not on file   Highest education level: Not on file  Occupational History    Employer: [employer]  Tobacco Use   Smoking status: Never    Passive exposure: Never   Smokeless tobacco: Never  Vaping Use   Vaping Use: Never used  Substance and Sexual Activity   Alcohol use: No   Drug use: No   Sexual activity: Yes  Other Topics Concern   Not on file  Social History Narrative   Not on file   Social Determinants of Health   Financial Resource Strain: Not on file  Food Insecurity: Not on file  Transportation Needs: Not on file  Physical Activity: Not on file  Stress: Not on file  Social Connections: Not on file  Intimate Partner Violence: Not on file    Family History  Problem Relation Age of Onset   Cancer Mother    Hypertension Mother    Diabetes Father    Cancer Father    Hypertension Father    Heart attack Father 14   CAD Father 81       She does not know details   Cancer Sister    Hypertension Sister     Past Surgical History:  Procedure Laterality Date   ABDOMINAL HYSTERECTOMY     BREAST SURGERY     CESAREAN SECTION     CHOLECYSTECTOMY  TRANSTHORACIC ECHOCARDIOGRAM  09/2019   EF 60 to 65%.  Mild LVH,  normal valves..  Normal echo    ROS: Review of Systems Negative except as stated above  PHYSICAL EXAM: There were no vitals taken for this visit.  Physical Exam  {female adult master:310786} {female adult master:310785}     Latest Ref Rng & Units 07/07/2022    3:08 PM 01/07/2022    9:45 AM 06/29/2020    4:37 PM  CMP  Glucose 70 - 99 mg/dL 106  97  83   BUN 6 - 20 mg/dL '12  9  12   '$ Creatinine 0.44 - 1.00 mg/dL 0.56  0.74  0.64   Sodium 135 - 145 mmol/L 139  139  138   Potassium 3.5 - 5.1 mmol/L 3.6  4.3  4.3   Chloride 98 - 111 mmol/L 104  100  102   CO2 22 - 32 mmol/L '26  23  23   '$ Calcium 8.9 - 10.3 mg/dL 8.7  9.4  9.2   Total Protein 6.5 - 8.1 g/dL 8.5     Total Bilirubin  0.3 - 1.2 mg/dL 0.5     Alkaline Phos 38 - 126 U/L 73     AST 15 - 41 U/L 21     ALT 0 - 44 U/L 18      Lipid Panel     Component Value Date/Time   CHOL 243 (H) 05/20/2022 1511   TRIG 58 05/20/2022 1511   HDL 74 05/20/2022 1511   CHOLHDL 3.3 05/20/2022 1511   LDLCALC 160 (H) 05/20/2022 1511    CBC    Component Value Date/Time   WBC 7.5 07/07/2022 1508   RBC 4.76 07/07/2022 1508   HGB 11.8 (L) 07/07/2022 1508   HGB 12.6 03/25/2019 1705   HGB 9.6 (L) 02/14/2013 1552   HCT 38.3 07/07/2022 1508   HCT 40.0 03/25/2019 1705   HCT 30.6 (L) 02/14/2013 1552   PLT 345 07/07/2022 1508   PLT 356 03/25/2019 1705   MCV 80.5 07/07/2022 1508   MCV 77.7 09/06/2019 1535   MCV 76 (L) 03/25/2019 1705   MCV 73.9 (L) 02/14/2013 1552   MCH 24.8 (L) 07/07/2022 1508   MCHC 30.8 07/07/2022 1508   RDW 14.9 07/07/2022 1508   RDW 15.2 03/25/2019 1705   RDW 17.8 (H) 02/14/2013 1552   LYMPHSABS 2.3 07/07/2022 1508   LYMPHSABS 2.3 02/14/2013 1552   MONOABS 0.5 07/07/2022 1508   MONOABS 0.6 02/14/2013 1552   EOSABS 0.1 07/07/2022 1508   EOSABS 0.1 02/15/2017 1632   BASOSABS 0.1 07/07/2022 1508   BASOSABS 0.1 02/14/2013 1552    ASSESSMENT AND PLAN:  There are no diagnoses linked to this encounter.   Patient was given the opportunity to ask questions.  Patient verbalized understanding of the plan and was able to repeat key elements of the plan. Patient was given clear instructions to go to Emergency Department or return to medical center if symptoms don't improve, worsen, or new problems develop.The patient verbalized understanding.   No orders of the defined types were placed in this encounter.    Requested Prescriptions    No prescriptions requested or ordered in this encounter    No follow-ups on file.  Camillia Herter, NP

## 2022-07-25 ENCOUNTER — Ambulatory Visit: Payer: 59 | Admitting: Family

## 2022-07-25 ENCOUNTER — Encounter: Payer: Self-pay | Admitting: Family

## 2022-07-25 VITALS — BP 128/87 | HR 76 | Temp 98.3°F | Resp 16 | Ht 65.98 in | Wt 234.0 lb

## 2022-07-25 DIAGNOSIS — Z90711 Acquired absence of uterus with remaining cervical stump: Secondary | ICD-10-CM

## 2022-07-25 DIAGNOSIS — H5789 Other specified disorders of eye and adnexa: Secondary | ICD-10-CM

## 2022-07-25 DIAGNOSIS — R232 Flushing: Secondary | ICD-10-CM | POA: Diagnosis not present

## 2022-07-25 DIAGNOSIS — R109 Unspecified abdominal pain: Secondary | ICD-10-CM | POA: Diagnosis not present

## 2022-07-25 DIAGNOSIS — R42 Dizziness and giddiness: Secondary | ICD-10-CM

## 2022-07-25 MED ORDER — ONDANSETRON 4 MG PO TBDP: 4.0000 mg | ORAL_TABLET | Freq: Three times a day (TID) | ORAL | 1 refills | Status: DC | PRN

## 2022-07-25 MED ORDER — ERYTHROMYCIN 5 MG/GM OP OINT: 1.0000 | TOPICAL_OINTMENT | Freq: Every day | OPHTHALMIC | 0 refills | Status: DC

## 2022-07-25 MED ORDER — MECLIZINE HCL 25 MG PO TABS: 25.0000 mg | ORAL_TABLET | Freq: Three times a day (TID) | ORAL | 1 refills | Status: DC | PRN

## 2022-07-25 NOTE — Progress Notes (Signed)
Pt presents for dizziness  -states that on Tuesday last week she experienced rooming spinning and faint spell  -symptoms include nausea, sweaty, denies vomiting and headache -pt experiencing red eye since last Thursday pt states seems like it was pinkeye

## 2022-07-25 NOTE — Patient Instructions (Signed)
Vertigo Vertigo is the feeling that you or the things around you are moving when they are not. This feeling can come and go at any time. Vertigo often goes away on its own. This condition can be dangerous if it happens when you are doing activities like driving or working with machines. Your doctor will do tests to find the cause of your vertigo. These tests will also help your doctor decide on the best treatment for you. Follow these instructions at home: Eating and drinking     Drink enough fluid to keep your pee (urine) pale yellow. Do not drink alcohol. Activity Return to your normal activities when your doctor says that it is safe. In the morning, first sit up on the side of the bed. When you feel okay, stand slowly while you hold onto something until you know that your balance is fine. Move slowly. Avoid sudden body or head movements or certain positions, as told by your doctor. Use a cane if you have trouble standing or walking. Sit down right away if you feel dizzy. Avoid doing any tasks or activities that can cause danger to you or others if you get dizzy. Avoid bending down if you feel dizzy. Place items in your home so that they are easy for you to reach without bending or leaning over. Do not drive or use machinery if you feel dizzy. General instructions Take over-the-counter and prescription medicines only as told by your doctor. Keep all follow-up visits. Contact a doctor if: Your medicine does not help your vertigo. Your problems get worse or you have new symptoms. You have a fever. You feel like you may vomit (nauseous), or this feeling gets worse. You start to vomit. Your family or friends see changes in how you act. You lose feeling (have numbness) in part of your body. You feel prickling and tingling in a part of your body. Get help right away if: You are always dizzy. You faint. You get very bad headaches. You get a stiff neck. Bright light starts to bother  you. You have trouble moving or talking. You feel weak in your hands, arms, or legs. You have changes in your hearing or in how you see (vision). These symptoms may be an emergency. Get help right away. Call your local emergency services (911 in the [place] Do not wait to see if the symptoms will go away. Do not drive yourself to the hospital. Summary Vertigo is the feeling that you or the things around you are moving when they are not. Your doctor will do tests to find the cause of your vertigo. You may be told to avoid some tasks, positions, or movements. Contact a doctor if your medicine is not helping, or if you have a fever, new symptoms, or a change in how you act. Get help right away if you get very bad headaches, or if you have changes in how you speak, hear, or see. This information is not intended to replace advice given to you by your health care provider. Make sure you discuss any questions you have with your health care provider. Document Revised: 05/06/2020 Document Reviewed: 05/06/2020 Elsevier Patient Education  2023 Elsevier Inc.  

## 2022-08-18 ENCOUNTER — Ambulatory Visit: Payer: 59 | Admitting: Radiology

## 2022-08-18 ENCOUNTER — Encounter: Payer: Self-pay | Admitting: Radiology

## 2022-08-18 VITALS — BP 122/84 | Ht 66.0 in | Wt 232.0 lb

## 2022-08-18 DIAGNOSIS — Z1211 Encounter for screening for malignant neoplasm of colon: Secondary | ICD-10-CM

## 2022-08-18 DIAGNOSIS — N951 Menopausal and female climacteric states: Secondary | ICD-10-CM

## 2022-08-18 MED ORDER — ESTRADIOL 0.025 MG/24HR TD PTTW: 1.0000 | MEDICATED_PATCH | TRANSDERMAL | 0 refills | Status: DC

## 2022-08-18 NOTE — Progress Notes (Signed)
   Audrey Avery 15-Oct-1970 [id number]   History:  52 y.o. G4P2 Referred from PCP for hot flashes. Complains of hot flashes x's 1 year, now more consistent and severe. Denies vaginal dryness. HX partial hysterectomy (has bilateral ovaries--per patient).   Pap 2014 normal M 01/2022 per patient  C never    Past medical history, past surgical history, family history and social history were all reviewed and documented in the EPIC chart.  ROS:  A ROS was performed and pertinent positives and negatives are included.  Exam:  Vitals:   08/18/22 0938  BP: 122/84  Weight: 232 lb (105.2 kg)  Height: 5' 6"$  (1.676 m)   Body mass index is 37.45 kg/m.    Patient informed chaperone available to be present for breast and pelvic exam. Patient has requested no chaperone to be present. Patient has been advised what will be completed during breast and pelvic exam.   Assessment/Plan:   1. Vasomotor symptoms due to menopause Discussed HRT vr veozah Risks and benefits reviewed Will monitor BP closely and let us know of any increase Warning signs reviewed - estradiol (VIVELLE-DOT) 0.025 MG/24HR; Place 1 patch onto the skin 2 (two) times a week.  Dispense: 24 patch; Refill: 0    Follow up 3 months for med check and AEX  [place] B WHNP-BC 10:20 AM 08/18/2022

## 2022-08-24 ENCOUNTER — Other Ambulatory Visit: Payer: Self-pay | Admitting: Family

## 2022-08-24 DIAGNOSIS — E785 Hyperlipidemia, unspecified: Secondary | ICD-10-CM

## 2022-08-24 NOTE — Telephone Encounter (Signed)
Requested Prescriptions  Pending Prescriptions Disp Refills   atorvastatin (LIPITOR) 20 MG tablet [Pharmacy Med Name: ATORVASTATIN '20MG'$  TABLETS] 30 tablet 0    Sig: TAKE 1 TABLET BY MOUTH DAILY     Cardiovascular:  Antilipid - Statins Failed - 08/24/2022  8:01 AM      Failed - Lipid Panel in normal range within the last 12 months    Cholesterol, Total  Date Value Ref Range Status  05/20/2022 243 (H) 100 - 199 mg/dL Final   LDL Chol Calc (NIH)  Date Value Ref Range Status  05/20/2022 160 (H) 0 - 99 mg/dL Final   HDL  Date Value Ref Range Status  05/20/2022 74 >39 mg/dL Final   Triglycerides  Date Value Ref Range Status  05/20/2022 58 0 - 149 mg/dL Final         Passed - Patient is not pregnant      Passed - Valid encounter within last 12 months    Recent Outpatient Visits           1 month ago Vertigo   [facility], Amy J, NP   3 months ago Primary hypertension   [facility], Amy J, NP   7 months ago Essential (primary) hypertension   [facility], Amy J, NP   7 months ago Essential (primary) hypertension   [facility], [place], NP   11 months ago Flu-like symptoms   [facility], Flonnie Hailstone, NP

## 2022-09-18 ENCOUNTER — Other Ambulatory Visit: Payer: Self-pay | Admitting: Family

## 2022-09-18 DIAGNOSIS — E785 Hyperlipidemia, unspecified: Secondary | ICD-10-CM

## 2022-09-19 NOTE — Progress Notes (Signed)
Patient ID: Audrey Avery, female    DOB: August 08, 1970  MRN: [id number]  CC: Chronic Care Management   Subjective: Audrey Avery is a 52 y.o. female who presents for chronic care management.   Her concerns today include:  - Doing well on blood pressure medications, no issues/concerns. She denies red flag symptoms such as but not limited to chest pain, shortness of breath, worst headache of life, nausea/vomiting. She does not check blood pressures outside of office.  - Doing well on cholesterol medication, no issues/concerns.   Patient Active Problem List   Diagnosis Date Noted   Hyperlipidemia 05/21/2022   Essential hypertension 10/02/2019   Syncope and collapse 09/30/2019   DOE (dyspnea on exertion) 09/30/2019   OSA (obstructive sleep apnea) 08/02/2019   Anemia 08/15/2012   Menorrhagia 08/15/2012   Refusal of blood transfusions as patient is Jehovah's Witness 08/15/2012     Current Outpatient Medications on File Prior to Visit  Medication Sig Dispense Refill   erythromycin ophthalmic ointment Place 1 Application into the left eye at bedtime. (Patient not taking: Reported on 08/18/2022) 3.5 g 0   estradiol (VIVELLE-DOT) 0.025 MG/24HR Place 1 patch onto the skin 2 (two) times a week. 24 patch 0   ibuprofen (ADVIL) 800 MG tablet Take 800 mg by mouth every 6 (six) hours as needed. (Patient not taking: Reported on 08/18/2022)     meloxicam (MOBIC) 7.5 MG tablet Take 1 tablet (7.5 mg total) by mouth daily. (Patient not taking: Reported on 08/18/2022) 5 tablet 0   nystatin (MYCOSTATIN/NYSTOP) powder Apply 1 Application topically 3 (three) times daily. 60 g 2   No current facility-administered medications on file prior to visit.    Allergies  Allergen Reactions   Penicillins Anaphylaxis and Swelling    Has patient had a PCN reaction causing immediate rash, facial/tongue/throat swelling, SOB or lightheadedness with hypotension: Yes Has patient had a PCN reaction causing severe  rash involving mucus membranes or skin necrosis: No Has patient had a PCN reaction that required hospitalization No Has patient had a PCN reaction occurring within the last 10 years: No If all of the above answers are "NO", then may proceed with Cephalosporin use.     Social History   Socioeconomic History   Marital status: Single    Spouse name: Not on file   Number of children: Not on file   Years of education: Not on file   Highest education level: Not on file  Occupational History    Employer: [employer]  Tobacco Use   Smoking status: Never    Passive exposure: Past   Smokeless tobacco: Never  Vaping Use   Vaping Use: Never used  Substance and Sexual Activity   Alcohol use: Yes    Comment: occasionally   Drug use: No   Sexual activity: Yes    Partners: Male    Birth control/protection: Post-menopausal    Comment: menarche 52yo, sexual debut 52yo  Other Topics Concern   Not on file  Social History Narrative   Not on file   Social Determinants of Health   Financial Resource Strain: Not on file  Food Insecurity: Not on file  Transportation Needs: Not on file  Physical Activity: Not on file  Stress: Not on file  Social Connections: Not on file  Intimate Partner Violence: Not on file    Family History  Problem Relation Age of Onset   Cancer Mother    Hypertension Mother    Diabetes Father  Cancer Father    Hypertension Father    Heart attack Father 61   CAD Father 71       She does not know details   Cancer Sister    Hypertension Sister     Past Surgical History:  Procedure Laterality Date   ABDOMINAL HYSTERECTOMY     BREAST SURGERY     CESAREAN SECTION     CHOLECYSTECTOMY     TRANSTHORACIC ECHOCARDIOGRAM  09/2019   EF 60 to 65%.  Mild LVH,  normal valves..  Normal echo    ROS: Review of Systems Negative except as stated above  PHYSICAL EXAM: BP 124/80 (BP Location: Right Arm, Patient Position: Sitting, Cuff Size: Normal)    Pulse 85   Temp 98.5 F (36.9 C)   Resp 14   Ht 5\' 6"  (1.676 m)   Wt 236 lb (107 kg)   SpO2 98%   BMI 38.09 kg/m   Physical Exam HENT:     Head: Normocephalic and atraumatic.  Eyes:     Extraocular Movements: Extraocular movements intact.     Conjunctiva/sclera: Conjunctivae normal.     Pupils: Pupils are equal, round, and reactive to light.  Cardiovascular:     Rate and Rhythm: Normal rate and regular rhythm.     Pulses: Normal pulses.     Heart sounds: Normal heart sounds.  Pulmonary:     Effort: Pulmonary effort is normal.     Breath sounds: Normal breath sounds.  Musculoskeletal:     Cervical back: Normal range of motion and neck supple.  Neurological:     General: No focal deficit present.     Mental Status: She is alert and oriented to person, place, and time.  Psychiatric:        Mood and Affect: Mood normal.        Behavior: Behavior normal.    ASSESSMENT AND PLAN: 1. Primary hypertension - Continue Amlodipine and Hydrochlorothiazide as prescribed.  - Counseled on blood pressure goal of less than 130/80, low-sodium, DASH diet, medication compliance, and 150 minutes of moderate intensity exercise per week as tolerated. Counseled on medication adherence and adverse effects. - Follow-up with primary provider in 6 months or sooner if needed.  - amLODipine (NORVASC) 10 MG tablet; Take 1 tablet (10 mg total) by mouth daily.  Dispense: 90 tablet; Refill: 0 - hydrochlorothiazide (HYDRODIURIL) 50 MG tablet; Take 1 tablet (50 mg total) by mouth daily.  Dispense: 90 tablet; Refill: 0  2. Hyperlipidemia, unspecified hyperlipidemia type - Continue Atorvastatin as prescribed.  - Routine screening.  - Follow-up with primary provider as scheduled.  - Lipid panel - atorvastatin (LIPITOR) 20 MG tablet; Take 1 tablet (20 mg total) by mouth daily.  Dispense: 90 tablet; Refill: 0  3. Prediabetes - Hemoglobin A1c 6.2% remaining consistent with prediabetes.  - Follow-up with  primary provider in 6 months or sooner if needed.  - POCT glycosylated hemoglobin (Hb A1C)   Patient was given the opportunity to ask questions.  Patient verbalized understanding of the plan and was able to repeat key elements of the plan. Patient was given clear instructions to go to Emergency Department or return to medical center if symptoms don't improve, worsen, or new problems develop.The patient verbalized understanding.   Orders Placed This Encounter  Procedures   Lipid panel   POCT glycosylated hemoglobin (Hb A1C)     Requested Prescriptions   Signed Prescriptions Disp Refills   amLODipine (NORVASC) 10 MG tablet 90 tablet 0  Sig: Take 1 tablet (10 mg total) by mouth daily.   hydrochlorothiazide (HYDRODIURIL) 50 MG tablet 90 tablet 0    Sig: Take 1 tablet (50 mg total) by mouth daily.   atorvastatin (LIPITOR) 20 MG tablet 90 tablet 0    Sig: Take 1 tablet (20 mg total) by mouth daily.    Return in about 6 months (around 03/25/2023) for Follow-Up or next available chronic care mgmt .  Rema FendtAmy J Janell Keeling, NP

## 2022-09-23 ENCOUNTER — Ambulatory Visit: Payer: 59 | Admitting: Family

## 2022-09-23 ENCOUNTER — Encounter: Payer: Self-pay | Admitting: Family

## 2022-09-23 VITALS — BP 124/80 | HR 85 | Temp 98.5°F | Resp 14 | Ht 66.0 in | Wt 236.0 lb

## 2022-09-23 DIAGNOSIS — E785 Hyperlipidemia, unspecified: Secondary | ICD-10-CM

## 2022-09-23 DIAGNOSIS — I10 Essential (primary) hypertension: Secondary | ICD-10-CM

## 2022-09-23 DIAGNOSIS — R7303 Prediabetes: Secondary | ICD-10-CM

## 2022-09-23 LAB — POCT GLYCOSYLATED HEMOGLOBIN (HGB A1C): HbA1c, POC (prediabetic range): 6.2 % (ref 5.7–6.4)

## 2022-09-23 MED ORDER — ATORVASTATIN CALCIUM 20 MG PO TABS: 20.0000 mg | ORAL_TABLET | Freq: Every day | ORAL | 0 refills | Status: DC

## 2022-09-23 MED ORDER — HYDROCHLOROTHIAZIDE 50 MG PO TABS: 50.0000 mg | ORAL_TABLET | Freq: Every day | ORAL | 0 refills | Status: DC

## 2022-09-23 MED ORDER — AMLODIPINE BESYLATE 10 MG PO TABS: 10.0000 mg | ORAL_TABLET | Freq: Every day | ORAL | 0 refills | Status: DC

## 2022-09-23 NOTE — Progress Notes (Signed)
Pt here for chronic care management   Needs refill on her cholesterol medication

## 2022-09-24 LAB — LIPID PANEL
Chol/HDL Ratio: 3 ratio (ref 0.0–4.4)
Cholesterol, Total: 146 mg/dL (ref 100–199)
HDL: 48 mg/dL (ref >39)
LDL Chol Calc (NIH): 78 mg/dL (ref 0–99)
Triglycerides: 109 mg/dL (ref 0–149)
VLDL Cholesterol Cal: 20 mg/dL (ref 5–40)

## 2022-10-24 ENCOUNTER — Encounter: Payer: Self-pay | Admitting: Family

## 2022-10-24 NOTE — Telephone Encounter (Signed)
Call patient with update. Report to the Emergency Department or Urgent Care for immediate evaluation/management. Follow-up with primary provider at discharge.

## 2022-10-24 NOTE — Telephone Encounter (Signed)
Please call patient

## 2022-11-10 ENCOUNTER — Other Ambulatory Visit: Payer: Self-pay | Admitting: Radiology

## 2022-11-10 DIAGNOSIS — N951 Menopausal and female climacteric states: Secondary | ICD-10-CM

## 2022-11-10 NOTE — Telephone Encounter (Signed)
Med refill request: Estradiol Patch Last AEX: 08/18/22 Next AEX: 12/02/22 Last MMG (if hormonal med) 01/21/22 Breast Density Cat C, BI-RADS CAT 1 neg Refill authorized: Please Advise?

## 2022-12-01 ENCOUNTER — Other Ambulatory Visit: Payer: Self-pay

## 2022-12-01 DIAGNOSIS — B3789 Other sites of candidiasis: Secondary | ICD-10-CM

## 2022-12-01 MED ORDER — NYSTATIN 100000 UNIT/GM EX POWD: 1.0000 | Freq: Three times a day (TID) | CUTANEOUS | 2 refills | Status: DC

## 2022-12-02 ENCOUNTER — Encounter: Payer: Self-pay | Admitting: Radiology

## 2022-12-02 ENCOUNTER — Ambulatory Visit (INDEPENDENT_AMBULATORY_CARE_PROVIDER_SITE_OTHER): Payer: 59 | Admitting: Radiology

## 2022-12-02 VITALS — BP 120/72 | Ht 66.0 in | Wt 234.0 lb

## 2022-12-02 DIAGNOSIS — Z01419 Encounter for gynecological examination (general) (routine) without abnormal findings: Secondary | ICD-10-CM

## 2022-12-02 DIAGNOSIS — N951 Menopausal and female climacteric states: Secondary | ICD-10-CM

## 2022-12-02 MED ORDER — ESTRADIOL 0.05 MG/24HR TD PTTW: 1.0000 | MEDICATED_PATCH | TRANSDERMAL | 12 refills | Status: DC

## 2022-12-02 NOTE — Progress Notes (Signed)
   Audrey Avery May 14, 1971 [id number]   History:  52 y.o. G4P2 presents for annual exam. Doing well on vivelle, no elevating in BP. Hot flashes have improved. Interested in increasing the dose. No other gyn concerns.  Gynecologic History Hysterectomy: 2014 for fibroids   Health Maintenance Last Pap: 09/24/12. Results were: normal Last mammogram:01/21/22  Last colonoscopy: never   Past medical history, past surgical history, family history and social history were all reviewed and documented in the EPIC chart.  ROS:  A ROS was performed and pertinent positives and negatives are included.  Exam:  Vitals:   12/02/22 1150  BP: 120/72  Weight: 234 lb (106.1 kg)  Height: 5\' 6"  (1.676 m)   Body mass index is 37.77 kg/m.  General appearance:  Normal obese Thyroid:  Symmetrical, normal in size, without palpable masses or nodularity. Respiratory  Auscultation:  Clear without wheezing or rhonchi Cardiovascular  Auscultation:  Regular rate, without rubs, murmurs or gallops  Edema/varicosities:  Not grossly evident Abdominal  Soft,nontender, without masses, guarding or rebound.  Liver/spleen:  No organomegaly noted  Hernia:  None appreciated  Skin  Inspection:  Grossly normal Breasts: Examined lying and sitting.   Right: Without masses, retractions, nipple discharge or axillary adenopathy.   Left: Without masses, retractions, nipple discharge or axillary adenopathy. Genitourinary   Inguinal/mons:  Normal without inguinal adenopathy  External genitalia:  Normal appearing vulva with no masses, tenderness, or lesions  BUS/Urethra/Skene's glands:  Normal  Vagina:  Normal appearing with normal color and discharge, no lesions.   Cervix:  absent  Uterus:  absent  Adnexa/parametria:     Rt: Normal in size, without masses or tenderness.   Lt: Normal in size, without masses or tenderness.  Anus and perineum: Normal   Raynelle Fanning, CMA present for exam  Assessment/Plan:   1. Well  woman exam with routine gynecological exam Mammo up to date Needs colon cancer screening Labs with PCP  2. Vasomotor symptoms due to menopause - estradiol (VIVELLE-DOT) 0.05 MG/24HR patch; Place 1 patch (0.05 mg total) onto the skin 2 (two) times a week.  Dispense: 8 patch; Refill: 12  F/u 3 months for BP check   Discussed SBE, colonoscopy and DEXA screening as appropriate. Encouraged 123mins/week of cardiovascular and weight bearing exercise minimum. Recommend the use of seatbelts and sunscreen consistently.   Return in 1 year for annual or sooner prn.  Arlie Solomons B WHNP-BC 12:01 PM 12/02/2022

## 2022-12-12 ENCOUNTER — Other Ambulatory Visit: Payer: Self-pay | Admitting: Obstetrics and Gynecology

## 2022-12-12 DIAGNOSIS — N951 Menopausal and female climacteric states: Secondary | ICD-10-CM

## 2022-12-20 ENCOUNTER — Other Ambulatory Visit: Payer: Self-pay | Admitting: Family

## 2022-12-20 DIAGNOSIS — Z1231 Encounter for screening mammogram for malignant neoplasm of breast: Secondary | ICD-10-CM

## 2022-12-21 ENCOUNTER — Other Ambulatory Visit: Payer: Self-pay | Admitting: Family

## 2022-12-21 DIAGNOSIS — I10 Essential (primary) hypertension: Secondary | ICD-10-CM

## 2022-12-21 DIAGNOSIS — E785 Hyperlipidemia, unspecified: Secondary | ICD-10-CM

## 2023-01-19 ENCOUNTER — Emergency Department (HOSPITAL_COMMUNITY)
Admission: EM | Admit: 2023-01-19 | Discharge: 2023-01-19 | Disposition: A | Discharge status: Home / Self Care | Payer: 59 | Attending: Emergency Medicine | Admitting: Emergency Medicine

## 2023-01-19 ENCOUNTER — Other Ambulatory Visit: Payer: Self-pay

## 2023-01-19 ENCOUNTER — Ambulatory Visit: Payer: Self-pay | Admitting: *Deleted

## 2023-01-19 ENCOUNTER — Emergency Department (HOSPITAL_COMMUNITY): Payer: 59

## 2023-01-19 ENCOUNTER — Encounter (HOSPITAL_COMMUNITY): Payer: Self-pay | Admitting: *Deleted

## 2023-01-19 DIAGNOSIS — R0602 Shortness of breath: Secondary | ICD-10-CM | POA: Diagnosis present

## 2023-01-19 DIAGNOSIS — I10 Essential (primary) hypertension: Secondary | ICD-10-CM | POA: Diagnosis not present

## 2023-01-19 DIAGNOSIS — T59891A Toxic effect of other specified gases, fumes and vapors, accidental (unintentional), initial encounter: Secondary | ICD-10-CM | POA: Diagnosis not present

## 2023-01-19 DIAGNOSIS — Z79899 Other long term (current) drug therapy: Secondary | ICD-10-CM | POA: Insufficient documentation

## 2023-01-19 NOTE — Telephone Encounter (Signed)
Report to the Emergency Department/call 911 for immediate medical evaluation. Follow-up with Primary Care at discharge.
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
## 2023-01-19 NOTE — Telephone Encounter (Signed)
FYI

## 2023-01-19 NOTE — Telephone Encounter (Signed)
Patient at ED 
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
## 2023-01-27 ENCOUNTER — Ambulatory Visit
Admission: RE | Admit: 2023-01-27 | Discharge: 2023-01-27 | Disposition: A | Discharge status: Home / Self Care | Payer: 59 | Source: Ambulatory Visit | Attending: Family | Admitting: Family

## 2023-01-27 ENCOUNTER — Other Ambulatory Visit: Payer: Self-pay | Admitting: Family

## 2023-01-27 ENCOUNTER — Ambulatory Visit: Payer: 59

## 2023-01-27 DIAGNOSIS — I10 Essential (primary) hypertension: Secondary | ICD-10-CM

## 2023-01-27 DIAGNOSIS — E785 Hyperlipidemia, unspecified: Secondary | ICD-10-CM

## 2023-01-27 DIAGNOSIS — Z1231 Encounter for screening mammogram for malignant neoplasm of breast: Secondary | ICD-10-CM

## 2023-01-31 ENCOUNTER — Other Ambulatory Visit: Payer: Self-pay

## 2023-01-31 DIAGNOSIS — E785 Hyperlipidemia, unspecified: Secondary | ICD-10-CM

## 2023-01-31 MED ORDER — ATORVASTATIN CALCIUM 20 MG PO TABS
20.0000 mg | ORAL_TABLET | Freq: Every day | ORAL | 0 refills | Status: DC
Start: 2023-01-31 — End: 2023-04-28

## 2023-03-07 ENCOUNTER — Ambulatory Visit: Payer: 59 | Admitting: Radiology

## 2023-03-20 ENCOUNTER — Encounter: Payer: Self-pay | Admitting: Family

## 2023-03-24 ENCOUNTER — Ambulatory Visit: Payer: 59 | Admitting: Family

## 2023-03-24 ENCOUNTER — Ambulatory Visit: Payer: 59 | Admitting: Radiology

## 2023-04-13 ENCOUNTER — Telehealth: Payer: Self-pay

## 2023-04-13 ENCOUNTER — Other Ambulatory Visit: Payer: Self-pay

## 2023-04-13 NOTE — Telephone Encounter (Signed)
Good afternoon Tresa Endo, can you help me with this prior auth please.  Saxenda 18MG /3ML pen-injectors

## 2023-04-14 ENCOUNTER — Other Ambulatory Visit: Payer: Self-pay

## 2023-04-14 NOTE — Telephone Encounter (Signed)
error 

## 2023-04-17 ENCOUNTER — Other Ambulatory Visit: Payer: Self-pay

## 2023-04-28 ENCOUNTER — Ambulatory Visit (INDEPENDENT_AMBULATORY_CARE_PROVIDER_SITE_OTHER): Payer: 59 | Admitting: Family

## 2023-04-28 VITALS — BP 117/78 | HR 75 | Temp 98.6°F | Ht 66.0 in | Wt 233.8 lb

## 2023-04-28 DIAGNOSIS — Z0100 Encounter for examination of eyes and vision without abnormal findings: Secondary | ICD-10-CM

## 2023-04-28 DIAGNOSIS — E785 Hyperlipidemia, unspecified: Secondary | ICD-10-CM

## 2023-04-28 DIAGNOSIS — Z6837 Body mass index (BMI) 37.0-37.9, adult: Secondary | ICD-10-CM

## 2023-04-28 DIAGNOSIS — Z7984 Long term (current) use of oral hypoglycemic drugs: Secondary | ICD-10-CM

## 2023-04-28 DIAGNOSIS — R7303 Prediabetes: Secondary | ICD-10-CM

## 2023-04-28 DIAGNOSIS — I10 Essential (primary) hypertension: Secondary | ICD-10-CM

## 2023-04-28 DIAGNOSIS — Z7689 Persons encountering health services in other specified circumstances: Secondary | ICD-10-CM

## 2023-04-28 DIAGNOSIS — E119 Type 2 diabetes mellitus without complications: Secondary | ICD-10-CM | POA: Diagnosis not present

## 2023-04-28 MED ORDER — HYDROCHLOROTHIAZIDE 50 MG PO TABS: 50.0000 mg | ORAL_TABLET | Freq: Every day | ORAL | 0 refills | Status: DC

## 2023-04-28 MED ORDER — PHENTERMINE HCL 15 MG PO CAPS
15.0000 mg | ORAL_CAPSULE | ORAL | 0 refills | Status: DC
Start: 2023-04-28 — End: 2023-06-30

## 2023-04-28 MED ORDER — AMLODIPINE BESYLATE 10 MG PO TABS: 10.0000 mg | ORAL_TABLET | Freq: Every day | ORAL | 0 refills | Status: DC

## 2023-04-28 MED ORDER — ATORVASTATIN CALCIUM 20 MG PO TABS
20.0000 mg | ORAL_TABLET | Freq: Every day | ORAL | 0 refills | Status: DC
Start: 2023-04-28 — End: 2023-06-06

## 2023-04-28 NOTE — Progress Notes (Signed)
Patient ID: Audrey Avery, female    DOB: 02/02/1971  MRN: [id number]  CC: Chronic Conditions Follow-Up  Subjective: Audrey Avery is a 52 y.o. female who presents for chronic conditions follow-up.  Her concerns today include:  - Doing well on Amlodipine and Hydrochlorothiazide, no issues/concerns. She does not complain of red flag symptoms such as but not limited to chest pain, shortness of breath, worst headache of life, nausea/vomiting.  - Doing well on Atorvastatin, no issues/concerns.  - States she would like to try a weight loss pill due to states she has to pay out of pocket for weight loss injections. She is trying to watch what she eats. She does not exercise outside of her normal routine.    Patient Active Problem List   Diagnosis Date Noted   Hyperlipidemia 05/21/2022   Essential hypertension 10/02/2019   Syncope and collapse 09/30/2019   DOE (dyspnea on exertion) 09/30/2019   OSA (obstructive sleep apnea) 08/02/2019   Anemia 08/15/2012   Menorrhagia 08/15/2012   Refusal of blood transfusions as patient is Jehovah's Witness 08/15/2012     Current Outpatient Medications on File Prior to Visit  Medication Sig Dispense Refill   estradiol (VIVELLE-DOT) 0.05 MG/24HR patch Place 1 patch (0.05 mg total) onto the skin 2 (two) times a week. 8 patch 12   ibuprofen (ADVIL) 800 MG tablet Take 800 mg by mouth every 6 (six) hours as needed.     nystatin (MYCOSTATIN/NYSTOP) powder Apply 1 Application topically 3 (three) times daily. 60 g 2   meloxicam (MOBIC) 7.5 MG tablet Take 1 tablet (7.5 mg total) by mouth daily. (Patient not taking: Reported on 08/18/2022) 5 tablet 0   No current facility-administered medications on file prior to visit.    Allergies  Allergen Reactions   Penicillins Anaphylaxis and Swelling    Has patient had a PCN reaction causing immediate rash, facial/tongue/throat swelling, SOB or lightheadedness with hypotension: Yes Has patient had a PCN  reaction causing severe rash involving mucus membranes or skin necrosis: No Has patient had a PCN reaction that required hospitalization No Has patient had a PCN reaction occurring within the last 10 years: No If all of the above answers are "NO", then may proceed with Cephalosporin use.     Social History   Socioeconomic History   Marital status: Single    Spouse name: Not on file   Number of children: Not on file   Years of education: Not on file   Highest education level: Some college, no degree  Occupational History    Employer: [employer]  Tobacco Use   Smoking status: Never    Passive exposure: Past   Smokeless tobacco: Never  Vaping Use   Vaping status: Never Used  Substance and Sexual Activity   Alcohol use: Yes    Comment: occasionally   Drug use: No   Sexual activity: Yes    Partners: Male    Birth control/protection: Post-menopausal    Comment: menarche 52yo, sexual debut 52yo  Other Topics Concern   Not on file  Social History Narrative   Not on file   Social Determinants of Health   Financial Resource Strain: Medium Risk (04/28/2023)   Overall Financial Resource Strain (CARDIA)    Difficulty of Paying Living Expenses: Somewhat hard  Food Insecurity: Food Insecurity Present (04/28/2023)   Hunger Vital Sign    Worried About Running Out of Food in the Last Year: Often true    Ran Out of  Food in the Last Year: Often true  Transportation Needs: No Transportation Needs (04/28/2023)   PRAPARE - Administrator, Civil Service (Medical): No    Lack of Transportation (Non-Medical): No  Physical Activity: Sufficiently Active (04/28/2023)   Exercise Vital Sign    Days of Exercise per Week: 3 days    Minutes of Exercise per Session: 60 min  Stress: No Stress Concern Present (04/28/2023)   [employer] of Occupational Health - Occupational Stress Questionnaire    Feeling of Stress : Only a little  Social Connections: Moderately Integrated  (04/28/2023)   Social Connection and Isolation Panel [NHANES]    Frequency of Communication with Friends and Family: More than three times a week    Frequency of Social Gatherings with Friends and Family: Once a week    Attends Religious Services: More than 4 times per year    Active Member of [employer] or Organizations: No    Attends Engineer, structural: Not on file    Marital Status: Living with partner  Intimate Partner Violence: Not on file    Family History  Problem Relation Age of Onset   Cancer Mother    Hypertension Mother    Diabetes Father    Cancer Father    Hypertension Father    Heart attack Father 53   CAD Father 45       She does not know details   Cancer Sister    Hypertension Sister    Colon polyps Sister     Past Surgical History:  Procedure Laterality Date   ABDOMINAL HYSTERECTOMY     BREAST SURGERY     CESAREAN SECTION     CHOLECYSTECTOMY     TRANSTHORACIC ECHOCARDIOGRAM  09/2019   EF 60 to 65%.  Mild LVH,  normal valves..  Normal echo    ROS: Review of Systems Negative except as stated above  PHYSICAL EXAM: BP 117/78   Pulse 75   Temp 98.6 F (37 C) (Oral)   Ht 5\' 6"  (1.676 m)   Wt 233 lb 12.8 oz (106.1 kg)   SpO2 96%   BMI 37.74 kg/m   Physical Exam HENT:     Head: Normocephalic and atraumatic.     Nose: Nose normal.     Mouth/Throat:     Mouth: Mucous membranes are moist.     Pharynx: Oropharynx is clear.  Eyes:     Extraocular Movements: Extraocular movements intact.     Conjunctiva/sclera: Conjunctivae normal.     Pupils: Pupils are equal, round, and reactive to light.  Cardiovascular:     Rate and Rhythm: Normal rate and regular rhythm.     Pulses: Normal pulses.     Heart sounds: Normal heart sounds.  Pulmonary:     Effort: Pulmonary effort is normal.     Breath sounds: Normal breath sounds.  Musculoskeletal:        General: Normal range of motion.     Cervical back: Normal range of motion and neck supple.   Neurological:     General: No focal deficit present.     Mental Status: She is alert and oriented to person, place, and time.  Psychiatric:        Mood and Affect: Mood normal.        Behavior: Behavior normal.     ASSESSMENT AND PLAN: 1. Primary hypertension - Continue Amlodipine and Hydrochlorothiazide as prescribed.  - Routine screening.  - Counseled on blood pressure goal of  less than 130/80, low-sodium, DASH diet, medication compliance, and 150 minutes of moderate intensity exercise per week as tolerated. Counseled on medication adherence and adverse effects. -  Follow-up with primary provider in 3 months or sooner if needed.  - amLODipine (NORVASC) 10 MG tablet; Take 1 tablet (10 mg total) by mouth daily.  Dispense: 90 tablet; Refill: 0 - hydrochlorothiazide (HYDRODIURIL) 50 MG tablet; Take 1 tablet (50 mg total) by mouth daily.  Dispense: 90 tablet; Refill: 0 - Basic Metabolic Panel  2. Hyperlipidemia, unspecified hyperlipidemia type - Continue Atorvastatin as prescribed. Counseled on medication adherence/adverse effects.  - Follow-up with primary provider as scheduled. - atorvastatin (LIPITOR) 20 MG tablet; Take 1 tablet (20 mg total) by mouth daily.  Dispense: 90 tablet; Refill: 0  3. Prediabetes - Routine screening.  - Microalbumin / creatinine urine ratio - Hemoglobin A1c  4. Diabetic eye exam [facility]) - Referral to Ophthalmology for evaluation/management. - Ambulatory referral to Ophthalmology  5. Encounter for diabetic foot exam [facility]) - Referral to Podiatry for evaluation/management. - Ambulatory referral to Podiatry  6. Encounter for weight management 7. BMI 37.0-37.9, adult - Phentermine as prescribed. Counseled on medication adherence and adverse effects.  - I did check the [facility] prescription drug database. - Counseled on low-sodium DASH diet and 150 minutes of moderate intensity exercise per week as tolerated to assist with weight management.  -  Follow-up with primary provider in 4 weeks or sooner if needed. - phentermine 15 MG capsule; Take 1 capsule (15 mg total) by mouth every morning.  Dispense: 30 capsule; Refill: 0   Patient was given the opportunity to ask questions.  Patient verbalized understanding of the plan and was able to repeat key elements of the plan. Patient was given clear instructions to go to Emergency Department or return to medical center if symptoms don't improve, worsen, or new problems develop.The patient verbalized understanding.   Orders Placed This Encounter  Procedures   Microalbumin / creatinine urine ratio   Hemoglobin A1c   Basic Metabolic Panel   Ambulatory referral to Podiatry   Ambulatory referral to Ophthalmology     Requested Prescriptions   Signed Prescriptions Disp Refills   amLODipine (NORVASC) 10 MG tablet 90 tablet 0    Sig: Take 1 tablet (10 mg total) by mouth daily.   hydrochlorothiazide (HYDRODIURIL) 50 MG tablet 90 tablet 0    Sig: Take 1 tablet (50 mg total) by mouth daily.   atorvastatin (LIPITOR) 20 MG tablet 90 tablet 0    Sig: Take 1 tablet (20 mg total) by mouth daily.   phentermine 15 MG capsule 30 capsule 0    Sig: Take 1 capsule (15 mg total) by mouth every morning.    Return in about 3 months (around 07/29/2023) for Follow-Up or next available chronic conditions and 4 weeks weight check.  Rema Fendt, NP

## 2023-04-28 NOTE — Progress Notes (Signed)
Patient states she couldn't get her weight loss medication filled, asking for pills for weight loss instead.   Patient declined Flu vaccine.

## 2023-04-29 LAB — BASIC METABOLIC PANEL
BUN/Creatinine Ratio: 19 (ref 9–23)
BUN: 14 mg/dL (ref 6–24)
CO2: 26 mmol/L (ref 20–29)
Calcium: 9.8 mg/dL (ref 8.7–10.2)
Chloride: 97 mmol/L (ref 96–106)
Creatinine, Ser: 0.72 mg/dL (ref 0.57–1.00)
Sodium: 142 mmol/L (ref 134–144)
eGFR: 102 mL/min/{1.73_m2} (ref >59)

## 2023-04-29 LAB — HEMOGLOBIN A1C
Est. average glucose Bld gHb Est-mCnc: 148 mg/dL
Hgb A1c MFr Bld: 6.8 % — ABNORMAL HIGH (ref 4.8–5.6)

## 2023-04-29 LAB — MICROALBUMIN / CREATININE URINE RATIO
Creatinine, Urine: 183.9 mg/dL
Microalb/Creat Ratio: 16 mg/g{creat} (ref 0–29)
Microalbumin, Urine: 29.3 ug/mL

## 2023-05-01 ENCOUNTER — Other Ambulatory Visit: Payer: Self-pay | Admitting: Family

## 2023-05-01 DIAGNOSIS — E1165 Type 2 diabetes mellitus with hyperglycemia: Secondary | ICD-10-CM

## 2023-05-01 DIAGNOSIS — Z13228 Encounter for screening for other metabolic disorders: Secondary | ICD-10-CM

## 2023-05-01 MED ORDER — METFORMIN HCL ER 500 MG PO TB24
500.0000 mg | ORAL_TABLET | Freq: Every day | ORAL | 1 refills | Status: DC
Start: 2023-05-01 — End: 2023-06-23

## 2023-05-26 ENCOUNTER — Ambulatory Visit: Payer: 59 | Admitting: Podiatry

## 2023-05-26 ENCOUNTER — Other Ambulatory Visit: Payer: 59

## 2023-05-26 DIAGNOSIS — Z0189 Encounter for other specified special examinations: Secondary | ICD-10-CM

## 2023-05-26 DIAGNOSIS — Z13228 Encounter for screening for other metabolic disorders: Secondary | ICD-10-CM

## 2023-05-26 DIAGNOSIS — E119 Type 2 diabetes mellitus without complications: Secondary | ICD-10-CM | POA: Diagnosis not present

## 2023-05-26 DIAGNOSIS — Z794 Long term (current) use of insulin: Secondary | ICD-10-CM

## 2023-05-26 NOTE — Progress Notes (Unsigned)
Subjective: Audrey Avery presents today referred by Rema Fendt, NP for diabetic foot evaluation.  Patient relates many year history of diabetes.  Patient denies any history of foot wounds.  Patient denies any history of numbness, tingling, burning, pins/needles sensations.  Past Medical History:  Diagnosis Date   Anemia    Chronic cough 05/2018   With intermittent cough related syncope   Hypertension    Morbid obesity with BMI of 40.0-44.9, adult (HCC)    Obstructive sleep apnea of adult 2021   Recently diagnosed.  AHI 127.8, O2 nadir 51% (severe OSA) CPAP titration pending   PSVT (paroxysmal supraventricular tachycardia) (HCC) 09/2019   Cardiac monitor showed 62 episodes of short bursts of SVT ranging from 4-19 beats.   Syncope, tussive     Patient Active Problem List   Diagnosis Date Noted   Hyperlipidemia 05/21/2022   Essential hypertension 10/02/2019   Syncope and collapse 09/30/2019   DOE (dyspnea on exertion) 09/30/2019   OSA (obstructive sleep apnea) 08/02/2019   Anemia 08/15/2012   Menorrhagia 08/15/2012   Refusal of blood transfusions as patient is Jehovah's Witness 08/15/2012    Past Surgical History:  Procedure Laterality Date   ABDOMINAL HYSTERECTOMY     BREAST SURGERY     CESAREAN SECTION     CHOLECYSTECTOMY     TRANSTHORACIC ECHOCARDIOGRAM  09/2019   EF 60 to 65%.  Mild LVH,  normal valves..  Normal echo    Current Outpatient Medications on File Prior to Visit  Medication Sig Dispense Refill   amLODipine (NORVASC) 10 MG tablet Take 1 tablet (10 mg total) by mouth daily. 90 tablet 0   atorvastatin (LIPITOR) 20 MG tablet Take 1 tablet (20 mg total) by mouth daily. 90 tablet 0   estradiol (VIVELLE-DOT) 0.05 MG/24HR patch Place 1 patch (0.05 mg total) onto the skin 2 (two) times a week. 8 patch 12   hydrochlorothiazide (HYDRODIURIL) 50 MG tablet Take 1 tablet (50 mg total) by mouth daily. 90 tablet 0   ibuprofen (ADVIL) 800 MG tablet Take 800 mg  by mouth every 6 (six) hours as needed.     metFORMIN (GLUCOPHAGE-XR) 500 MG 24 hr tablet Take 1 tablet (500 mg total) by mouth daily with breakfast. 30 tablet 1   nystatin (MYCOSTATIN/NYSTOP) powder Apply 1 Application topically 3 (three) times daily. 60 g 2   phentermine 15 MG capsule Take 1 capsule (15 mg total) by mouth every morning. 30 capsule 0   No current facility-administered medications on file prior to visit.     Allergies  Allergen Reactions   Penicillins Anaphylaxis and Swelling    Has patient had a PCN reaction causing immediate rash, facial/tongue/throat swelling, SOB or lightheadedness with hypotension: Yes Has patient had a PCN reaction causing severe rash involving mucus membranes or skin necrosis: No Has patient had a PCN reaction that required hospitalization No Has patient had a PCN reaction occurring within the last 10 years: No If all of the above answers are "NO", then may proceed with Cephalosporin use.     Social History   Occupational History    Employer: [employer]  Tobacco Use   Smoking status: Never    Passive exposure: Past   Smokeless tobacco: Never  Vaping Use   Vaping status: Never Used  Substance and Sexual Activity   Alcohol use: Yes    Comment: occasionally   Drug use: No   Sexual activity: Yes    Partners: Male  Birth control/protection: Post-menopausal    Comment: menarche 52yo, sexual debut 52yo    Family History  Problem Relation Age of Onset   Cancer Mother    Hypertension Mother    Diabetes Father    Cancer Father    Hypertension Father    Heart attack Father 87   CAD Father 35       She does not know details   Cancer Sister    Hypertension Sister    Colon polyps Sister     Immunization History  Administered Date(s) Administered   PFIZER(Purple Top)SARS-COV-2 Vaccination 01/11/2020, 02/01/2020   PPD Test 06/17/2016, 09/06/2019    Review of systems: Positive Findings in bold print.  Constitutional:   chills, fatigue, fever, sweats, weight change Communication: Nurse, learning disability, sign Presenter, broadcasting, hand writing, iPad/Android device Head: headaches, head injury Eyes: changes in vision, eye pain, glaucoma, cataracts, macular degeneration, diplopia, glare,  light sensitivity, eyeglasses or contacts, blindness Ears nose mouth throat: hearing impaired, hearing aids,  ringing in ears, deaf, sign language,  vertigo, nosebleeds,  rhinitis,  cold sores, snoring, swollen glands Cardiovascular: HTN, edema, arrhythmia, pacemaker in place, defibrillator in place, chest pain/tightness, chronic anticoagulation, blood clot, heart failure, MI Peripheral Vascular: leg cramps, varicose veins, blood clots, lymphedema, varicosities Respiratory:  asthma, difficulty breathing, denies congestion, SOB, wheezing, cough, emphysema Gastrointestinal: change in appetite or weight, abdominal pain, constipation, diarrhea, nausea, vomiting, vomiting blood, change in bowel habits, abdominal pain, jaundice, rectal bleeding, hemorrhoids, GERD Genitourinary:  nocturia,  pain on urination, polyuria,  blood in urine, Foley catheter, urinary urgency, ESRD on hemodialysis Musculoskeletal: amputation, cramping, stiff joints, painful joints, decreased joint motion, fractures, OA, gout, hemiplegia, paraplegia, uses cane, wheelchair bound, uses walker, uses rollator Skin: +changes in toenails, color change, dryness, itching, mole changes,  rash, wound(s) Neurological: headaches, numbness in feet, paresthesias in feet, burning in feet, fainting,  seizures, change in speech, migraines, memory problems/poor historian, cerebral palsy, weakness, paralysis, CVA, TIA Endocrine: diabetes, hypothyroidism, hyperthyroidism,  goiter, dry mouth, flushing, heat intolerance, cold intolerance,  excessive thirst, denies polyuria,  nocturia Hematological:  easy bleeding, excessive bleeding, easy bruising, enlarged lymph nodes, on long term blood thinner,  history of past transusions Allergy/immunological:  hives, eczema, frequent infections, multiple drug allergies, seasonal allergies, transplant recipient, multiple food allergies Psychiatric:  anxiety, depression, mood disorder, suicidal ideations, hallucinations, insomnia  Objective: There were no vitals filed for this visit. Vascular Examination: Capillary refill time less than 3 seconds x 10 digits.  Dorsalis pedis pulses palpable 2 out of 4.  Posterior tibial pulses palpable 2 out of 4.  Digital hair not present x 10 digits.  Skin temperature gradient WNL b/l.  Dermatological Examination: Skin with normal turgor, texture and tone b/l  Toenails 1-5 b/l discolored, thick, dystrophic with subungual debris and pain with palpation to nailbeds due to thickness of nails.  Musculoskeletal: Muscle strength 5/5 to all LE muscle groups.  Neurological: Sensation intact with 10 gram monofilament.  Vibratory sensation intact.  Assessment: NIDDM Encounter for diabetic foot examination  Plan: Discussed diabetic foot care principles. Literature dispensed on today. Patient to continue soft, supportive shoe gear daily. Patient to report any pedal injuries to medical professional immediately. Follow up one year. Patient/POA to call should there be a concern in the interim.

## 2023-05-27 ENCOUNTER — Other Ambulatory Visit: Payer: Self-pay | Admitting: Family

## 2023-05-27 DIAGNOSIS — Z7689 Persons encountering health services in other specified circumstances: Secondary | ICD-10-CM

## 2023-05-27 DIAGNOSIS — Z6837 Body mass index (BMI) 37.0-37.9, adult: Secondary | ICD-10-CM

## 2023-05-27 LAB — POTASSIUM: Potassium: 3.7 mmol/L (ref 3.5–5.2)

## 2023-05-29 NOTE — Telephone Encounter (Signed)
Schedule appointment?

## 2023-05-30 ENCOUNTER — Telehealth: Payer: Self-pay | Admitting: Family

## 2023-05-30 NOTE — Telephone Encounter (Signed)
Schedule appointment?

## 2023-06-03 ENCOUNTER — Other Ambulatory Visit: Payer: Self-pay | Admitting: Family

## 2023-06-03 DIAGNOSIS — Z6837 Body mass index (BMI) 37.0-37.9, adult: Secondary | ICD-10-CM

## 2023-06-03 DIAGNOSIS — E785 Hyperlipidemia, unspecified: Secondary | ICD-10-CM

## 2023-06-03 DIAGNOSIS — Z7689 Persons encountering health services in other specified circumstances: Secondary | ICD-10-CM

## 2023-06-06 ENCOUNTER — Other Ambulatory Visit: Payer: Self-pay | Admitting: Family

## 2023-06-06 DIAGNOSIS — E785 Hyperlipidemia, unspecified: Secondary | ICD-10-CM

## 2023-06-06 MED ORDER — ATORVASTATIN CALCIUM 20 MG PO TABS: 20.0000 mg | ORAL_TABLET | Freq: Every day | ORAL | 0 refills | Status: DC

## 2023-06-06 NOTE — Telephone Encounter (Signed)
-   Atorvastatin prescribed 06/06/2023.  - Phentermine schedule appointment.

## 2023-06-23 ENCOUNTER — Other Ambulatory Visit: Payer: Self-pay | Admitting: Family

## 2023-06-23 ENCOUNTER — Other Ambulatory Visit: Payer: Self-pay

## 2023-06-23 DIAGNOSIS — E1165 Type 2 diabetes mellitus with hyperglycemia: Secondary | ICD-10-CM

## 2023-06-23 MED ORDER — METFORMIN HCL ER 500 MG PO TB24
500.0000 mg | ORAL_TABLET | Freq: Every day | ORAL | 1 refills | Status: DC
Start: 2023-06-23 — End: 2023-08-07

## 2023-06-30 ENCOUNTER — Ambulatory Visit (INDEPENDENT_AMBULATORY_CARE_PROVIDER_SITE_OTHER): Payer: 59 | Admitting: Family

## 2023-06-30 VITALS — BP 115/78 | HR 70 | Temp 97.7°F | Ht 66.0 in | Wt 230.4 lb

## 2023-06-30 DIAGNOSIS — E785 Hyperlipidemia, unspecified: Secondary | ICD-10-CM | POA: Diagnosis not present

## 2023-06-30 DIAGNOSIS — Z5941 Food insecurity: Secondary | ICD-10-CM

## 2023-06-30 DIAGNOSIS — E669 Obesity, unspecified: Secondary | ICD-10-CM | POA: Diagnosis not present

## 2023-06-30 DIAGNOSIS — E1165 Type 2 diabetes mellitus with hyperglycemia: Secondary | ICD-10-CM

## 2023-06-30 DIAGNOSIS — Z5986 Financial insecurity: Secondary | ICD-10-CM

## 2023-06-30 DIAGNOSIS — E119 Type 2 diabetes mellitus without complications: Secondary | ICD-10-CM

## 2023-06-30 DIAGNOSIS — E66812 Obesity, class 2: Secondary | ICD-10-CM

## 2023-06-30 DIAGNOSIS — Z7984 Long term (current) use of oral hypoglycemic drugs: Secondary | ICD-10-CM

## 2023-06-30 DIAGNOSIS — I10 Essential (primary) hypertension: Secondary | ICD-10-CM | POA: Diagnosis not present

## 2023-06-30 DIAGNOSIS — Z1211 Encounter for screening for malignant neoplasm of colon: Secondary | ICD-10-CM

## 2023-06-30 DIAGNOSIS — Z7689 Persons encountering health services in other specified circumstances: Secondary | ICD-10-CM

## 2023-06-30 DIAGNOSIS — Z6837 Body mass index (BMI) 37.0-37.9, adult: Secondary | ICD-10-CM

## 2023-06-30 LAB — POCT GLYCOSYLATED HEMOGLOBIN (HGB A1C): HbA1c, POC (controlled diabetic range): 6.4 % (ref 0.0–7.0)

## 2023-06-30 MED ORDER — AMLODIPINE BESYLATE 10 MG PO TABS: 10.0000 mg | ORAL_TABLET | Freq: Every day | ORAL | 0 refills | Status: DC

## 2023-06-30 MED ORDER — HYDROCHLOROTHIAZIDE 50 MG PO TABS
50.0000 mg | ORAL_TABLET | Freq: Every day | ORAL | 0 refills | Status: DC
Start: 2023-06-30 — End: 2023-09-29

## 2023-06-30 MED ORDER — ATORVASTATIN CALCIUM 20 MG PO TABS
20.0000 mg | ORAL_TABLET | Freq: Every day | ORAL | 0 refills | Status: DC
Start: 2023-06-30 — End: 2023-09-29

## 2023-06-30 MED ORDER — METFORMIN HCL ER 500 MG PO TB24: 500.0000 mg | ORAL_TABLET | Freq: Every day | ORAL | 0 refills | Status: DC

## 2023-06-30 MED ORDER — PHENTERMINE HCL 30 MG PO CAPS
30.0000 mg | ORAL_CAPSULE | ORAL | 0 refills | Status: DC
Start: 2023-06-30 — End: 2023-08-04

## 2023-06-30 NOTE — Progress Notes (Signed)
 Patient states she needs new referral for colonoscopy.

## 2023-06-30 NOTE — Progress Notes (Signed)
 Patient ID: Audrey Avery, female    DOB: [birth year]  MRN: [id number]  CC: Chronic Conditions Follow-Up  Subjective: Audrey Avery is a 53 y.o. female who presents for chronic conditions follow-up.   Her concerns today include:  - Doing well on Amlodipine  and Hydrochlorothiazide , no issues/concerns. She does not complain of red flag symptoms such as but not limited to chest pain, shortness of breath, worst headache of life, nausea/vomiting.  - Doing well on Metformin  XR, no issues/concerns. Denies red flag symptoms associated with diabetes.  - Doing well on Atorvastatin , no issues/concerns.  - Doing well on Phentermine , no issues/concerns.  - Colonoscopy referral. Denies related symptoms.  Patient Active Problem List   Diagnosis Date Noted   Hyperlipidemia 05/21/2022   Essential hypertension 10/02/2019   Syncope and collapse 09/30/2019   DOE (dyspnea on exertion) 09/30/2019   OSA (obstructive sleep apnea) 08/02/2019   Anemia 08/15/2012   Menorrhagia 08/15/2012   Refusal of blood transfusions as patient is Jehovah's Witness 08/15/2012     Current Outpatient Medications on File Prior to Visit  Medication Sig Dispense Refill   estradiol  (VIVELLE -DOT) 0.05 MG/24HR patch Place 1 patch (0.05 mg total) onto the skin 2 (two) times a week. 8 patch 12   nystatin  (MYCOSTATIN /NYSTOP ) powder Apply 1 Application topically 3 (three) times daily. 60 g 2   ibuprofen  (ADVIL ) 800 MG tablet Take 800 mg by mouth every 6 (six) hours as needed. (Patient not taking: Reported on 06/30/2023)     metFORMIN  (GLUCOPHAGE -XR) 500 MG 24 hr tablet Take 1 tablet (500 mg total) by mouth daily with breakfast. (Patient not taking: Reported on 06/30/2023) 30 tablet 1   No current facility-administered medications on file prior to visit.    Allergies  Allergen Reactions   Penicillins Anaphylaxis and Swelling    Has patient had a PCN reaction causing immediate rash, facial/tongue/throat swelling, SOB or  lightheadedness with hypotension: Yes Has patient had a PCN reaction causing severe rash involving mucus membranes or skin necrosis: No Has patient had a PCN reaction that required hospitalization No Has patient had a PCN reaction occurring within the last 10 years: No If all of the above answers are NO, then may proceed with Cephalosporin use.     Social History   Socioeconomic History   Marital status: Single    Spouse name: Not on file   Number of children: Not on file   Years of education: Not on file   Highest education level: Associate degree: occupational, scientist, product/process development, or vocational program  Occupational History    Employer: [employer]  Tobacco Use   Smoking status: Never    Passive exposure: Past   Smokeless tobacco: Never  Vaping Use   Vaping status: Never Used  Substance and Sexual Activity   Alcohol use: Yes    Comment: occasionally   Drug use: No   Sexual activity: Yes    Partners: Male    Birth control/protection: Post-menopausal    Comment: menarche 53yo, sexual debut 53yo  Other Topics Concern   Not on file  Social History Narrative   Not on file   Social Drivers of Health   Financial Resource Strain: Medium Risk (06/27/2023)   Overall Financial Resource Strain (CARDIA)    Difficulty of Paying Living Expenses: Somewhat hard  Food Insecurity: Food Insecurity Present (06/27/2023)   Hunger Vital Sign    Worried About Running Out of Food in the Last Year: Often true    Ran Out of  Food in the Last Year: Sometimes true  Transportation Needs: No Transportation Needs (06/27/2023)   PRAPARE - Administrator, Civil Service (Medical): No    Lack of Transportation (Non-Medical): No  Physical Activity: Inactive (06/27/2023)   Exercise Vital Sign    Days of Exercise per Week: 0 days    Minutes of Exercise per Session: 60 min  Stress: No Stress Concern Present (06/27/2023)   [employer] of Occupational Health - Occupational Stress  Questionnaire    Feeling of Stress : Not at all  Social Connections: Moderately Integrated (06/27/2023)   Social Connection and Isolation Panel [NHANES]    Frequency of Communication with Friends and Family: More than three times a week    Frequency of Social Gatherings with Friends and Family: Never    Attends Religious Services: More than 4 times per year    Active Member of [employer] or Organizations: No    Attends Engineer, Structural: Not on file    Marital Status: Living with partner  Intimate Partner Violence: Not on file    Family History  Problem Relation Age of Onset   Cancer Mother    Hypertension Mother    Diabetes Father    Cancer Father    Hypertension Father    Heart attack Father 58   CAD Father 83       She does not know details   Cancer Sister    Hypertension Sister    Colon polyps Sister     Past Surgical History:  Procedure Laterality Date   ABDOMINAL HYSTERECTOMY     BREAST SURGERY     CESAREAN SECTION     CHOLECYSTECTOMY     TRANSTHORACIC ECHOCARDIOGRAM  09/2019   EF 60 to 65%.  Mild LVH,  normal valves..  Normal echo    ROS: Review of Systems Negative except as stated above  PHYSICAL EXAM: BP 115/78   Pulse 70   Temp 97.7 F (36.5 C) (Oral)   Ht 5' 6 (1.676 m)   Wt 230 lb 6.4 oz (104.5 kg)   SpO2 92%   BMI 37.19 kg/m   Physical Exam HENT:     Head: Normocephalic and atraumatic.     Nose: Nose normal.     Mouth/Throat:     Mouth: Mucous membranes are moist.     Pharynx: Oropharynx is clear.  Eyes:     Extraocular Movements: Extraocular movements intact.     Conjunctiva/sclera: Conjunctivae normal.     Pupils: Pupils are equal, round, and reactive to light.  Cardiovascular:     Rate and Rhythm: Normal rate and regular rhythm.     Pulses: Normal pulses.     Heart sounds: Normal heart sounds.  Pulmonary:     Effort: Pulmonary effort is normal.     Breath sounds: Normal breath sounds.  Musculoskeletal:        General:  Normal range of motion.     Cervical back: Normal range of motion and neck supple.  Neurological:     General: No focal deficit present.     Mental Status: She is alert and oriented to person, place, and time.  Psychiatric:        Mood and Affect: Mood normal.        Behavior: Behavior normal.    Results for orders placed or performed in visit on 06/30/23  POCT glycosylated hemoglobin (Hb A1C)  Result Value Ref Range   Hemoglobin A1C  HbA1c POC (<> result, manual entry)     HbA1c, POC (prediabetic range)     HbA1c, POC (controlled diabetic range) 6.4 0.0 - 7.0 %    ASSESSMENT AND PLAN: 1. Primary hypertension (Primary) - Continue Amlodipine  and Hydrochlorothiazide  as prescribed. - Counseled on blood pressure goal of less than 130/80, low-sodium, DASH diet, medication compliance, and 150 minutes of moderate intensity exercise per week as tolerated. Counseled on medication adherence and adverse effects. - Follow-up with primary provider in 3 months or sooner if needed.  - amLODipine  (NORVASC ) 10 MG tablet; Take 1 tablet (10 mg total) by mouth daily.  Dispense: 90 tablet; Refill: 0 - hydrochlorothiazide  (HYDRODIURIL ) 50 MG tablet; Take 1 tablet (50 mg total) by mouth daily.  Dispense: 90 tablet; Refill: 0  2. Type 2 diabetes mellitus with hyperglycemia, without long-term current use of insulin (HCC) - Hemoglobin A1c at goal at 6.4%, goal 7%.  - Continue Metformin  XR as prescribed. Counseled on medication adherence/adverse effects.  - Discussed the importance of healthy eating habits, low-carbohydrate diet, low-sugar diet, regular aerobic exercise (at least 150 minutes a week as tolerated) and medication compliance to achieve or maintain control of diabetes. - Follow-up with primary provider in 3 months or sooner if needed.  - POCT glycosylated hemoglobin (Hb A1C) - metFORMIN  (GLUCOPHAGE -XR) 500 MG 24 hr tablet; Take 1 tablet (500 mg total) by mouth daily with breakfast.  Dispense: 90  tablet; Refill: 0  3. Diabetic eye exam [facility]) - Referral to Ophthalmology for evaluation/management.  - Ambulatory referral to Ophthalmology  4. Hyperlipidemia, unspecified hyperlipidemia type - Continue Atorvastatin  as prescribed. Counseled on medication adherence/adverse effects. - Follow-up with primary provider in 3 months or sooner if needed.  - atorvastatin  (LIPITOR) 20 MG tablet; Take 1 tablet (20 mg total) by mouth daily.  Dispense: 90 tablet; Refill: 0  5. Encounter for weight management 6. BMI 37.0-37.9, adult - Increase Phentermine  from 15 mg to 30 mg as prescribed. Counseled on medication adherence/adverse effects.  - Follow-up with primary provider in 4 weeks or sooner if needed for weight check. - phentermine  30 MG capsule; Take 1 capsule (30 mg total) by mouth every morning.  Dispense: 30 capsule; Refill: 0  7. Colon cancer screening - Referral to Gastroenterology for colon cancer screening by colonoscopy. - Ambulatory referral to Gastroenterology   Patient was given the opportunity to ask questions.  Patient verbalized understanding of the plan and was able to repeat key elements of the plan. Patient was given clear instructions to go to Emergency Department or return to medical center if symptoms don't improve, worsen, or new problems develop.The patient verbalized understanding.   Orders Placed This Encounter  Procedures   Ambulatory referral to Gastroenterology   Ambulatory referral to Ophthalmology   POCT glycosylated hemoglobin (Hb A1C)     Requested Prescriptions   Signed Prescriptions Disp Refills   amLODipine  (NORVASC ) 10 MG tablet 90 tablet 0    Sig: Take 1 tablet (10 mg total) by mouth daily.   hydrochlorothiazide  (HYDRODIURIL ) 50 MG tablet 90 tablet 0    Sig: Take 1 tablet (50 mg total) by mouth daily.   atorvastatin  (LIPITOR) 20 MG tablet 90 tablet 0    Sig: Take 1 tablet (20 mg total) by mouth daily.   metFORMIN  (GLUCOPHAGE -XR) 500 MG 24 hr  tablet 90 tablet 0    Sig: Take 1 tablet (500 mg total) by mouth daily with breakfast.   phentermine  30 MG capsule 30 capsule 0  Sig: Take 1 capsule (30 mg total) by mouth every morning.    Return in about 3 months (around 09/28/2023) for Follow-Up or next available chronic conditions and 4 weeks weight check .  Greig JINNY Drones, NP

## 2023-07-04 ENCOUNTER — Other Ambulatory Visit: Payer: Self-pay

## 2023-07-04 ENCOUNTER — Telehealth: Payer: Self-pay

## 2023-07-04 NOTE — Telephone Encounter (Signed)
 Pharmacy Patient Advocate Encounter  Received notification from [facility] MEDICAID that Prior Authorization for PHENTERMINE has been APPROVED from 07/04/2023 to 10/02/2023   PA #/Case ID/Reference #: [id number]

## 2023-07-04 NOTE — Telephone Encounter (Signed)
 Pharmacy Patient Advocate Encounter   Received notification from Physician's Office that prior authorization for PHENTERMINE  is required/requested.   Insurance verification completed.   The patient is insured through [facility] MEDICAID .   Per test claim: PA required; PA submitted to above mentioned insurance via CoverMyMeds Key/confirmation #/EOC [id number] Status is pending

## 2023-07-07 ENCOUNTER — Encounter: Payer: Self-pay | Admitting: Gastroenterology

## 2023-07-07 ENCOUNTER — Telehealth: Payer: Self-pay

## 2023-07-07 NOTE — Telephone Encounter (Signed)
The pt was notified and voiced understanding/appreciation for the cb.  Pt scheduled for OV on 07/12/2023.

## 2023-07-07 NOTE — Telephone Encounter (Signed)
She cancelled her 59month follow up twice.... schedule OV. Needs BP check before increasing dose.

## 2023-07-07 NOTE — Telephone Encounter (Signed)
Pt LVM in triage line stating that she initially was rx'd a lower dose patch to help w/ her vasomotor sxs of menopause. However, even with her current dose, she is still experiencing night sweats and voiced frustration with feeling like she is waisting her money on this medication and described her sxs as "suffering." Inquired if there was anything else that she could be prescribed alternatively to the patch?  Please advise.

## 2023-07-12 ENCOUNTER — Telehealth: Payer: Self-pay | Admitting: Family

## 2023-07-12 ENCOUNTER — Ambulatory Visit: Payer: 59 | Admitting: Radiology

## 2023-07-12 VITALS — BP 104/70 | HR 81

## 2023-07-12 DIAGNOSIS — N951 Menopausal and female climacteric states: Secondary | ICD-10-CM

## 2023-07-12 MED ORDER — ESTRADIOL 0.1 MG/24HR TD PTTW: 1.0000 | MEDICATED_PATCH | TRANSDERMAL | 12 refills | Status: DC

## 2023-07-12 NOTE — Progress Notes (Signed)
   Audrey Avery 1971-06-10 [id number]   History:  53 y.o. Audrey Avery presents for HRT follow up. Began having breakthrough hot flashes 2 months ago (around the time when she started phentermine) Trouble sleeping and night sweats. Previously she had been doing well on HRT.   Past medical history, past surgical history, family history and social history were all reviewed and documented in the EPIC chart.  ROS:  A ROS was performed and pertinent positives and negatives are included.  Exam:  Vitals:   07/12/23 0826  BP: 104/70  Pulse: 81  SpO2: 91%   There is no height or weight on file to calculate BMI.  Physical Exam Vitals and nursing note reviewed.  Constitutional:      Appearance: She is obese.  Pulmonary:     Effort: Pulmonary effort is normal.  Neurological:     Mental Status: She is alert.  Psychiatric:        Mood and Affect: Mood normal.        Thought Content: Thought content normal.        Judgment: Judgment normal.      Assessment/Plan:   1. Vasomotor symptoms due to menopause (Primary) Will increase dose Discuss switching to a GLP-1 with PCP to manage DM and obesity - estradiol (VIVELLE-DOT) 0.1 MG/24HR patch; Place 1 patch (0.1 mg total) onto the skin 2 (two) times a week.  Dispense: 8 patch; Refill: 12    Davine Sweney B WHNP-BC 8:35 AM 07/12/2023

## 2023-07-12 NOTE — Telephone Encounter (Unsigned)
Copied from CRM [id number]. Topic: Clinical - Medication Question >> Jul 12, 2023  8:58 AM Dondra Prader A wrote: Reason for CRM: Pt states that she went to her OBGYN and was told to reach back out to her PCP. Per pt OBGYN she does not need to take phentermine 30 MG capsule no more because it is increasing her menopausal symptoms and the medication is not good for your heart. Pt OBGYN recommended for her her to take G1P shot which will work better for her menopause and work better for her heart and her PCP would need to prescribe this medication for her. Please advise.   [id number] [place], [place] - [phone] AT [facility] OF [facility] & [address] Phone: [phone] Fax: [phone]

## 2023-07-13 ENCOUNTER — Telehealth: Payer: Self-pay | Admitting: Family

## 2023-07-13 NOTE — Telephone Encounter (Signed)
Copied from CRM [id number]. Topic: Clinical - Medication Question >> Jul 13, 2023  1:45 PM Yolanda T wrote: Reason for CRM: patient called again stated she went to her OBGYN who advised she does not need to take phentermine 30 MG capsule anymore because it is increasing her menopausal symptoms and the medication is not good for your heart. Pt OBGYN recommended for her her to take G1P shot which will work better for her heart and PCP would need to prescribe this medication to her. Please advise. Please f/u with patient

## 2023-07-18 NOTE — Telephone Encounter (Signed)
Schedule appointment?

## 2023-07-18 NOTE — Telephone Encounter (Signed)
Appt had already been scheduled for 02/05 for med concern

## 2023-07-20 ENCOUNTER — Telehealth: Payer: 59 | Admitting: Family Medicine

## 2023-07-20 ENCOUNTER — Encounter: Payer: Self-pay | Admitting: *Deleted

## 2023-07-20 ENCOUNTER — Other Ambulatory Visit: Payer: Self-pay

## 2023-07-20 ENCOUNTER — Ambulatory Visit
Admission: EM | Admit: 2023-07-20 | Discharge: 2023-07-20 | Disposition: A | Discharge status: Home / Self Care | Payer: 59 | Attending: Family Medicine | Admitting: Family Medicine

## 2023-07-20 DIAGNOSIS — R6889 Other general symptoms and signs: Secondary | ICD-10-CM

## 2023-07-20 DIAGNOSIS — J101 Influenza due to other identified influenza virus with other respiratory manifestations: Secondary | ICD-10-CM | POA: Diagnosis not present

## 2023-07-20 LAB — POCT INFLUENZA A/B
Influenza A, POC: POSITIVE — AB
Influenza B, POC: NEGATIVE

## 2023-07-20 MED ORDER — HYDROCODONE BIT-HOMATROP MBR 5-1.5 MG/5ML PO SOLN: 5.0000 mL | Freq: Four times a day (QID) | ORAL | 0 refills | Status: DC | PRN

## 2023-07-20 NOTE — Discharge Instructions (Signed)
Be aware, your cough medication may cause drowsiness. Please do not drive, operate heavy machinery or make important decisions while on this medication, it can cloud your judgement.

## 2023-07-20 NOTE — ED Provider Notes (Signed)
[facility]   [id number] 07/20/23 Arrival Time: 1150  ASSESSMENT & PLAN:  1. Influenza A    Discussed typical duration of viral illness. Results for orders placed or performed during the hospital encounter of 07/20/23  POCT Influenza A/B   Collection Time: 07/20/23 12:36 PM  Result Value Ref Range   Influenza A, POC Positive (A) Negative   Influenza B, POC Negative Negative   OTC symptom care as needed.  Meds ordered this encounter  Medications   HYDROcodone bit-homatropine (HYCODAN) 5-1.5 MG/5ML syrup    Sig: Take 5 mLs by mouth every 6 (six) hours as needed for cough.    Dispense:  90 mL    Refill:  0     Follow-up Information     Rema Fendt, NP.   Specialty: Nurse Practitioner Why: As needed. Contact information: [address] [employer] [address] [place] [address] [phone]         [facility]).   Specialty: Urgent Care Why: If worsening or failing to improve as anticipated. Contact information: [address] [place] [address] [phone]                Reviewed expectations re: course of current medical issues. Questions answered. Outlined signs and symptoms indicating need for more acute intervention. Understanding verbalized. After Visit Summary given.   SUBJECTIVE: History from: Patient. Audrey Avery is a 53 y.o. female. Pt reports cough, body aches, and fever x 3 days. Taking nyquil and dayquil, last dose this morning. Reports temp of 104 on tuesday Denies: difficulty breathing. Normal PO intake without n/v/d.  OBJECTIVE:  General appearance: alert; no distress Eyes: PERRLA; EOMI; conjunctiva normal HENT: [place]; AT; with nasal congestion Neck: supple  Lungs: speaks full sentences without difficulty; unlabored Extremities: no edema Skin: warm and dry Neurologic: normal gait Psychological: alert and cooperative; normal mood and  affect  Labs: Results for orders placed or performed during the hospital encounter of 07/20/23  POCT Influenza A/B   Collection Time: 07/20/23 12:36 PM  Result Value Ref Range   Influenza A, POC Positive (A) Negative   Influenza B, POC Negative Negative   Labs Reviewed  POCT INFLUENZA A/B - Abnormal; Notable for the following components:      Result Value   Influenza A, POC Positive (*)    All other components within normal limits    Imaging: No results found.  Allergies  Allergen Reactions   Penicillins Anaphylaxis and Swelling    Has patient had a PCN reaction causing immediate rash, facial/tongue/throat swelling, SOB or lightheadedness with hypotension: Yes Has patient had a PCN reaction causing severe rash involving mucus membranes or skin necrosis: No Has patient had a PCN reaction that required hospitalization No Has patient had a PCN reaction occurring within the last 10 years: No If all of the above answers are "NO", then may proceed with Cephalosporin use.     Past Medical History:  Diagnosis Date   Anemia    Chronic cough 05/2018   With intermittent cough related syncope   Hypertension    Morbid obesity with BMI of 40.0-44.9, adult (HCC)    Obstructive sleep apnea of adult 2021   Recently diagnosed.  AHI 127.8, O2 nadir 51% (severe OSA) CPAP titration pending   PSVT (paroxysmal supraventricular tachycardia) (HCC) 09/2019   Cardiac monitor showed 62 episodes of short bursts of SVT ranging from 4-19 beats.   Syncope, tussive  Social History   Socioeconomic History   Marital status: Single    Spouse name: Not on file   Number of children: Not on file   Years of education: Not on file   Highest education level: Associate degree: occupational, Scientist, product/process development, or vocational program  Occupational History    Employer: [employer]  Tobacco Use   Smoking status: Never    Passive exposure: Past   Smokeless tobacco: Never  Vaping Use   Vaping status:  Never Used  Substance and Sexual Activity   Alcohol use: Yes    Comment: occasionally   Drug use: No   Sexual activity: Yes    Partners: Male    Birth control/protection: Post-menopausal    Comment: menarche 53yo, sexual debut 53yo  Other Topics Concern   Not on file  Social History Narrative   Not on file   Social Drivers of Health   Financial Resource Strain: Medium Risk (06/27/2023)   Overall Financial Resource Strain (CARDIA)    Difficulty of Paying Living Expenses: Somewhat hard  Food Insecurity: Food Insecurity Present (06/27/2023)   Hunger Vital Sign    Worried About Running Out of Food in the Last Year: Often true    Ran Out of Food in the Last Year: Sometimes true  Transportation Needs: No Transportation Needs (06/27/2023)   PRAPARE - Administrator, Civil Service (Medical): No    Lack of Transportation (Non-Medical): No  Physical Activity: Inactive (06/27/2023)   Exercise Vital Sign    Days of Exercise per Week: 0 days    Minutes of Exercise per Session: 60 min  Stress: No Stress Concern Present (06/27/2023)   [employer] of Occupational Health - Occupational Stress Questionnaire    Feeling of Stress : Not at all  Social Connections: Moderately Integrated (06/27/2023)   Social Connection and Isolation Panel [NHANES]    Frequency of Communication with Friends and Family: More than three times a week    Frequency of Social Gatherings with Friends and Family: Never    Attends Religious Services: More than 4 times per year    Active Member of [employer] or Organizations: No    Attends Engineer, structural: Not on file    Marital Status: Living with partner  Intimate Partner Violence: Not on file   Family History  Problem Relation Age of Onset   Cancer Mother    Hypertension Mother    Diabetes Father    Cancer Father    Hypertension Father    Heart attack Father 60   CAD Father 55       She does not know details   Cancer Sister    Hypertension  Sister    Colon polyps Sister    Past Surgical History:  Procedure Laterality Date   ABDOMINAL HYSTERECTOMY     BREAST SURGERY     CESAREAN SECTION     CHOLECYSTECTOMY     TRANSTHORACIC ECHOCARDIOGRAM  09/2019   EF 60 to 65%.  Mild LVH,  normal valves..  Normal echo     Mardella Layman, MD 07/20/23 [phone]

## 2023-07-20 NOTE — Progress Notes (Signed)
Virtual Visit Consent   Audrey Avery, you are scheduled for a virtual visit with a [facility] provider today. Just as with appointments in the office, your consent must be obtained to participate. Your consent will be active for this visit and any virtual visit you may have with one of our providers in the next 365 days. If you have a MyChart account, a copy of this consent can be sent to you electronically.  As this is a virtual visit, video technology does not allow for your provider to perform a traditional examination. This may limit your provider's ability to fully assess your condition. If your provider identifies any concerns that need to be evaluated in person or the need to arrange testing (such as labs, EKG, etc.), we will make arrangements to do so. Although advances in technology are sophisticated, we cannot ensure that it will always work on either your end or our end. If the connection with a video visit is poor, the visit may have to be switched to a telephone visit. With either a video or telephone visit, we are not always able to ensure that we have a secure connection.  By engaging in this virtual visit, you consent to the provision of healthcare and authorize for your insurance to be billed (if applicable) for the services provided during this visit. Depending on your insurance coverage, you may receive a charge related to this service.  I need to obtain your verbal consent now. Are you willing to proceed with your visit today? Audrey Avery has provided verbal consent on 07/20/2023 for a virtual visit (video or telephone). Freddy Finner, NP  Date: 07/20/2023 11:05 AM  Virtual Visit via Video Note   I, Freddy Finner, connected with  [name]  ([id number], [birth year]) on 07/20/23 at 11:15 AM EST by a video-enabled telemedicine application and verified that I am speaking with the correct person using two identifiers.  Location: Patient: Virtual Visit Location  Patient: Home Provider: Virtual Visit Location Provider: Home Office   I discussed the limitations of evaluation and management by telemedicine and the availability of in person appointments. The patient expressed understanding and agreed to proceed.    History of Present Illness: Audrey Avery is a 53 y.o. who identifies as a female who was assigned female at birth, and is being seen today for fever for 3 days  Onset was 3 days ago with chest pain when laying down- making her nauseous, woke up with scratch throat, and  fever that is staying over 100.4- 100.6, chills Associated symptoms are stuffy, coughing- non productive, congestion, bodyaches,  Modifying factors are nyquil and day quil, mucinex, cough drops  Denies chest pain- active , shortness of breath  Exposure to sick contacts- unknown  COVID test: no  Vaccines: not UTD    Problems:  Patient Active Problem List   Diagnosis Date Noted   Hyperlipidemia 05/21/2022   Essential hypertension 10/02/2019   Syncope and collapse 09/30/2019   DOE (dyspnea on exertion) 09/30/2019   OSA (obstructive sleep apnea) 08/02/2019   Anemia 08/15/2012   Menorrhagia 08/15/2012   Refusal of blood transfusions as patient is Jehovah's Witness 08/15/2012    Allergies:  Allergies  Allergen Reactions   Penicillins Anaphylaxis and Swelling    Has patient had a PCN reaction causing immediate rash, facial/tongue/throat swelling, SOB or lightheadedness with hypotension: Yes Has patient had a PCN reaction causing severe rash involving mucus membranes or skin necrosis: No Has patient  had a PCN reaction that required hospitalization No Has patient had a PCN reaction occurring within the last 10 years: No If all of the above answers are "NO", then may proceed with Cephalosporin use.    Medications:  Current Outpatient Medications:    amLODipine (NORVASC) 10 MG tablet, Take 1 tablet (10 mg total) by mouth daily., Disp: 90 tablet, Rfl: 0    atorvastatin (LIPITOR) 20 MG tablet, Take 1 tablet (20 mg total) by mouth daily., Disp: 90 tablet, Rfl: 0   estradiol (VIVELLE-DOT) 0.1 MG/24HR patch, Place 1 patch (0.1 mg total) onto the skin 2 (two) times a week., Disp: 8 patch, Rfl: 12   hydrochlorothiazide (HYDRODIURIL) 50 MG tablet, Take 1 tablet (50 mg total) by mouth daily., Disp: 90 tablet, Rfl: 0   ibuprofen (ADVIL) 800 MG tablet, Take 800 mg by mouth every 6 (six) hours as needed., Disp: , Rfl:    metFORMIN (GLUCOPHAGE-XR) 500 MG 24 hr tablet, Take 1 tablet (500 mg total) by mouth daily with breakfast., Disp: 30 tablet, Rfl: 1   metFORMIN (GLUCOPHAGE-XR) 500 MG 24 hr tablet, Take 1 tablet (500 mg total) by mouth daily with breakfast., Disp: 90 tablet, Rfl: 0   nystatin (MYCOSTATIN/NYSTOP) powder, Apply 1 Application topically 3 (three) times daily., Disp: 60 g, Rfl: 2   phentermine 30 MG capsule, Take 1 capsule (30 mg total) by mouth every morning., Disp: 30 capsule, Rfl: 0  Observations/Objective: Patient is well-developed, well-nourished in no acute distress.  Resting comfortably  at home.  Head is normocephalic, atraumatic.  No labored breathing.  Speech is clear and coherent with logical content.  Patient is alert and oriented at baseline.    Assessment and Plan:   1. Flu-like symptoms (Primary)  - Continue OTC symptomatic management of choice -Outside window for antiviral, advised to get covid/flu test and follow up if + for COVID as she is still in that timeframe to start meds.  - Push fluids - Rest as needed - Discussed return precautions and when to seek in-person evaluation, sent via AVS as well   Reviewed side effects, risks and benefits of medication.    Patient acknowledged agreement and understanding of the plan.   Past Medical, Surgical, Social History, Allergies, and Medications have been Reviewed.   Follow Up Instructions: I discussed the assessment and treatment plan with the patient. The patient was  provided an opportunity to ask questions and all were answered. The patient agreed with the plan and demonstrated an understanding of the instructions.  A copy of instructions were sent to the patient via MyChart unless otherwise noted below.    The patient was advised to call back or seek an in-person evaluation if the symptoms worsen or if the condition fails to improve as anticipated.    Freddy Finner, NP

## 2023-07-20 NOTE — ED Triage Notes (Signed)
Pt reports cough, body aches, and fever x 3 days. Taking nyquil and dayquil, last dose this morning. Reports temp of 104 on tuesday

## 2023-07-20 NOTE — Patient Instructions (Signed)
  Audrey Avery, thank you for joining Freddy Finner, NP for today's virtual visit.  While this provider is not your primary care provider (PCP), if your PCP is located in our provider database this encounter information will be shared with them immediately following your visit.   A [facility] MyChart account gives you access to today's visit and all your visits, tests, and labs performed at [facility] " click here if you don't have a [facility] MyChart account or go to [email or web address]  Consent: (Patient) Audrey Avery provided verbal consent for this virtual visit at the beginning of the encounter.  Current Medications:  Current Outpatient Medications:    amLODipine (NORVASC) 10 MG tablet, Take 1 tablet (10 mg total) by mouth daily., Disp: 90 tablet, Rfl: 0   atorvastatin (LIPITOR) 20 MG tablet, Take 1 tablet (20 mg total) by mouth daily., Disp: 90 tablet, Rfl: 0   estradiol (VIVELLE-DOT) 0.1 MG/24HR patch, Place 1 patch (0.1 mg total) onto the skin 2 (two) times a week., Disp: 8 patch, Rfl: 12   hydrochlorothiazide (HYDRODIURIL) 50 MG tablet, Take 1 tablet (50 mg total) by mouth daily., Disp: 90 tablet, Rfl: 0   ibuprofen (ADVIL) 800 MG tablet, Take 800 mg by mouth every 6 (six) hours as needed., Disp: , Rfl:    metFORMIN (GLUCOPHAGE-XR) 500 MG 24 hr tablet, Take 1 tablet (500 mg total) by mouth daily with breakfast., Disp: 30 tablet, Rfl: 1   metFORMIN (GLUCOPHAGE-XR) 500 MG 24 hr tablet, Take 1 tablet (500 mg total) by mouth daily with breakfast., Disp: 90 tablet, Rfl: 0   nystatin (MYCOSTATIN/NYSTOP) powder, Apply 1 Application topically 3 (three) times daily., Disp: 60 g, Rfl: 2   phentermine 30 MG capsule, Take 1 capsule (30 mg total) by mouth every morning., Disp: 30 capsule, Rfl: 0   Medications ordered in this encounter:  No orders of the defined types were placed in this encounter.    *If you need refills on other medications prior to your next  appointment, please contact your pharmacy*  Follow-Up: Call back or seek an in-person evaluation if the symptoms worsen or if the condition fails to improve as anticipated.  [facility] [phone]  Other Instructions Get covid flu combo test and update [place] on results if positive  - Continue OTC symptomatic management of choice  - Push fluids - Rest as needed - Discussed return precautions and when to seek in-person evaluation, sent via AVS as well     If you have been instructed to have an in-person evaluation today at a local Urgent Care facility, please use the link below. It will take you to a list of all of our available [facility], including address, phone number and hours of operation. Please do not delay care.  [facility]  If you or a family member do not have a primary care provider, use the link below to schedule a visit and establish care. When you choose a [facility] primary care physician or advanced practice provider, you gain a long-term partner in health. Find a Primary Care Provider  Learn more about [facility]'s in-office and virtual care options: [facility] - Get Care Now

## 2023-07-26 ENCOUNTER — Ambulatory Visit: Payer: 59 | Admitting: Family

## 2023-08-04 ENCOUNTER — Ambulatory Visit (AMBULATORY_SURGERY_CENTER): Payer: 59

## 2023-08-04 VITALS — Ht 66.0 in | Wt 230.0 lb

## 2023-08-04 DIAGNOSIS — Z1211 Encounter for screening for malignant neoplasm of colon: Secondary | ICD-10-CM

## 2023-08-04 MED ORDER — SUFLAVE 178.7 G PO SOLR
1.0000 | Freq: Once | ORAL | 0 refills | Status: AC
Start: 2023-08-04 — End: 2023-08-04

## 2023-08-04 NOTE — Progress Notes (Signed)

## 2023-08-07 ENCOUNTER — Encounter (HOSPITAL_COMMUNITY): Payer: Self-pay | Admitting: Emergency Medicine

## 2023-08-07 ENCOUNTER — Emergency Department (HOSPITAL_COMMUNITY): Payer: 59

## 2023-08-07 ENCOUNTER — Emergency Department (HOSPITAL_COMMUNITY)
Admission: EM | Admit: 2023-08-07 | Discharge: 2023-08-07 | Disposition: A | Discharge status: Home / Self Care | Payer: 59 | Attending: Emergency Medicine | Admitting: Emergency Medicine

## 2023-08-07 ENCOUNTER — Other Ambulatory Visit: Payer: Self-pay

## 2023-08-07 DIAGNOSIS — R1084 Generalized abdominal pain: Secondary | ICD-10-CM | POA: Insufficient documentation

## 2023-08-07 DIAGNOSIS — E119 Type 2 diabetes mellitus without complications: Secondary | ICD-10-CM | POA: Insufficient documentation

## 2023-08-07 DIAGNOSIS — Z7984 Long term (current) use of oral hypoglycemic drugs: Secondary | ICD-10-CM | POA: Diagnosis not present

## 2023-08-07 DIAGNOSIS — E876 Hypokalemia: Secondary | ICD-10-CM | POA: Diagnosis not present

## 2023-08-07 DIAGNOSIS — Z79899 Other long term (current) drug therapy: Secondary | ICD-10-CM | POA: Insufficient documentation

## 2023-08-07 DIAGNOSIS — K5900 Constipation, unspecified: Secondary | ICD-10-CM | POA: Insufficient documentation

## 2023-08-07 DIAGNOSIS — I10 Essential (primary) hypertension: Secondary | ICD-10-CM | POA: Insufficient documentation

## 2023-08-07 LAB — COMPREHENSIVE METABOLIC PANEL
ALT: 18 U/L (ref 0–44)
AST: 17 U/L (ref 15–41)
Albumin: 3.5 g/dL (ref 3.5–5.0)
Alkaline Phosphatase: 86 U/L (ref 38–126)
Anion gap: 12 (ref 5–15)
BUN: 13 mg/dL (ref 6–20)
CO2: 25 mmol/L (ref 22–32)
Calcium: 8.9 mg/dL (ref 8.9–10.3)
Chloride: 100 mmol/L (ref 98–111)
Creatinine, Ser: 0.75 mg/dL (ref 0.44–1.00)
GFR, Estimated: >60 mL/min (ref >60)
Glucose, Bld: 121 mg/dL — ABNORMAL HIGH (ref 70–99)
Potassium: 2.8 mmol/L — ABNORMAL LOW (ref 3.5–5.1)
Sodium: 137 mmol/L (ref 135–145)
Total Bilirubin: 0.6 mg/dL (ref 0.0–1.2)
Total Protein: 8.3 g/dL — ABNORMAL HIGH (ref 6.5–8.1)

## 2023-08-07 LAB — URINALYSIS, ROUTINE W REFLEX MICROSCOPIC
Bilirubin Urine: NEGATIVE
Glucose, UA: NEGATIVE mg/dL
Hgb urine dipstick: NEGATIVE
Ketones, ur: NEGATIVE mg/dL
Leukocytes,Ua: NEGATIVE
Nitrite: NEGATIVE
Protein, ur: NEGATIVE mg/dL
Specific Gravity, Urine: 1.016 (ref 1.005–1.030)
pH: 7 (ref 5.0–8.0)

## 2023-08-07 LAB — CBC
HCT: 37.2 % (ref 36.0–46.0)
Hemoglobin: 11.7 g/dL — ABNORMAL LOW (ref 12.0–15.0)
MCH: 25.2 pg — ABNORMAL LOW (ref 26.0–34.0)
MCHC: 31.5 g/dL (ref 30.0–36.0)
MCV: 80 fL (ref 80.0–100.0)
Platelets: 360 10*3/uL (ref 150–400)
RBC: 4.65 MIL/uL (ref 3.87–5.11)
RDW: 15.9 % — ABNORMAL HIGH (ref 11.5–15.5)
WBC: 8.3 10*3/uL (ref 4.0–10.5)
nRBC: 0 % (ref 0.0–0.2)

## 2023-08-07 LAB — LIPASE, BLOOD: Lipase: 26 U/L (ref 11–51)

## 2023-08-07 MED ORDER — ALUM & MAG HYDROXIDE-SIMETH 200-200-20 MG/5ML PO SUSP
30.0000 mL | Freq: Once | ORAL | Status: AC
  Administered 2023-08-07: 30 mL via ORAL
  Filled 2023-08-07: qty 30

## 2023-08-07 MED ORDER — POTASSIUM CHLORIDE ER 10 MEQ PO TBCR: 20.0000 meq | EXTENDED_RELEASE_TABLET | Freq: Every day | ORAL | 0 refills | Status: DC

## 2023-08-07 MED ORDER — POTASSIUM CHLORIDE CRYS ER 20 MEQ PO TBCR
40.0000 meq | EXTENDED_RELEASE_TABLET | Freq: Once | ORAL | Status: AC
  Administered 2023-08-07: 40 meq via ORAL
  Filled 2023-08-07: qty 4

## 2023-08-07 MED ORDER — HYOSCYAMINE SULFATE 0.125 MG SL SUBL: 0.1250 mg | SUBLINGUAL_TABLET | Freq: Four times a day (QID) | SUBLINGUAL | 0 refills | Status: DC | PRN

## 2023-08-07 NOTE — ED Provider Notes (Signed)
[place] EMERGENCY DEPARTMENT AT [facility] Provider Note  CSN: [id number] Arrival date & time: 08/07/23 0136  Chief Complaint(s) Abdominal Pain  HPI Audrey Avery is a 53 y.o. female     Abdominal Pain Pain location:  Generalized Pain quality: aching   Pain radiates to:  Does not radiate Pain severity:  Moderate Onset quality:  Gradual Duration:  2 days Timing:  Constant Progression:  Unchanged Chronicity:  New Relieved by:  Nothing Worsened by:  Movement and position changes Associated symptoms: constipation (improved after laxative) and diarrhea (after laxative)   Associated symptoms: no dysuria, no fatigue, no fever, no nausea and no vomiting    Had influenza and was Rx'd Tussionex.  Past Medical History Past Medical History:  Diagnosis Date   Anemia    Chronic cough 05/2018   With intermittent cough related syncope   Diabetes mellitus without complication (HCC)    Hyperlipidemia    Hypertension    Morbid obesity with BMI of 40.0-44.9, adult (HCC)    Obstructive sleep apnea of adult 2021   Recently diagnosed.  AHI 127.8, O2 nadir 51% (severe OSA) CPAP titration pending   PSVT (paroxysmal supraventricular tachycardia) (HCC) 09/2019   Cardiac monitor showed 62 episodes of short bursts of SVT ranging from 4-19 beats.   Sleep apnea    Syncope, tussive    Patient Active Problem List   Diagnosis Date Noted   Hyperlipidemia 05/21/2022   Essential hypertension 10/02/2019   Syncope and collapse 09/30/2019   DOE (dyspnea on exertion) 09/30/2019   OSA (obstructive sleep apnea) 08/02/2019   Anemia 08/15/2012   Menorrhagia 08/15/2012   Refusal of blood transfusions as patient is Jehovah's Witness 08/15/2012   Home Medication(s) Prior to Admission medications   Medication Sig Start Date End Date Taking? Authorizing Provider  amLODipine (NORVASC) 10 MG tablet Take 1 tablet (10 mg total) by mouth daily. 06/30/23  Yes Zonia Kief, Amy J, NP  atorvastatin  (LIPITOR) 20 MG tablet Take 1 tablet (20 mg total) by mouth daily. 06/30/23  Yes Zonia Kief, Amy J, NP  estradiol (VIVELLE-DOT) 0.1 MG/24HR patch Place 1 patch (0.1 mg total) onto the skin 2 (two) times a week. 07/13/23  Yes Chrzanowski, Jami B, NP  hydrochlorothiazide (HYDRODIURIL) 50 MG tablet Take 1 tablet (50 mg total) by mouth daily. 06/30/23 09/28/23 Yes Zonia Kief, Amy J, NP  hyoscyamine (LEVSIN/SL) 0.125 MG SL tablet Place 1 tablet (0.125 mg total) under the tongue every 6 (six) hours as needed for up to 5 days. 08/07/23 08/12/23 Yes Scotty Weigelt, Amadeo Garnet, MD  metFORMIN (GLUCOPHAGE-XR) 500 MG 24 hr tablet Take 1 tablet (500 mg total) by mouth daily with breakfast. 06/30/23 09/28/23 Yes Zonia Kief, Amy J, NP  nystatin (MYCOSTATIN/NYSTOP) powder Apply 1 Application topically 3 (three) times daily. 12/01/22  Yes Zonia Kief, Amy J, NP  potassium chloride (KLOR-CON) 10 MEQ tablet Take 2 tablets (20 mEq total) by mouth daily for 7 days. 08/07/23 08/14/23 Yes Layann Bluett, Amadeo Garnet, MD  Allergies Penicillins  Review of Systems Review of Systems  Constitutional:  Negative for fatigue and fever.  Gastrointestinal:  Positive for abdominal pain, constipation (improved after laxative) and diarrhea (after laxative). Negative for nausea and vomiting.  Genitourinary:  Negative for dysuria.   As noted in HPI  Physical Exam Vital Signs  I have reviewed the triage vital signs BP (!) 106/93 (BP Location: Right Arm)   Pulse 71   Temp 98.7 F (37.1 C) (Oral)   Resp 16   SpO2 94%   Physical Exam Vitals reviewed.  Constitutional:      General: She is not in acute distress.    Appearance: She is well-developed. She is not diaphoretic.  HENT:     Head: Normocephalic and atraumatic.     Right Ear: External ear normal.     Left Ear: External ear normal.     Nose: Nose normal.  Eyes:      General: No scleral icterus.    Conjunctiva/sclera: Conjunctivae normal.  Neck:     Trachea: Phonation normal.  Cardiovascular:     Rate and Rhythm: Normal rate and regular rhythm.  Pulmonary:     Effort: Pulmonary effort is normal. No respiratory distress.     Breath sounds: No stridor.  Abdominal:     General: There is no distension.     Tenderness: There is generalized abdominal tenderness. There is no guarding or rebound.  Musculoskeletal:        General: Normal range of motion.     Cervical back: Normal range of motion.  Neurological:     Mental Status: She is alert and oriented to person, place, and time.  Psychiatric:        Behavior: Behavior normal.     ED Results and Treatments Labs (all labs ordered are listed, but only abnormal results are displayed) Labs Reviewed  COMPREHENSIVE METABOLIC PANEL - Abnormal; Notable for the following components:      Result Value   Potassium 2.8 (*)    Glucose, Bld 121 (*)    Total Protein 8.3 (*)    All other components within normal limits  CBC - Abnormal; Notable for the following components:   Hemoglobin 11.7 (*)    MCH 25.2 (*)    RDW 15.9 (*)    All other components within normal limits  URINALYSIS, ROUTINE W REFLEX MICROSCOPIC  LIPASE, BLOOD                                                                                                                         EKG  EKG Interpretation Date/Time:    Ventricular Rate:    PR Interval:    QRS Duration:    QT Interval:    QTC Calculation:   R Axis:      Text Interpretation:         Radiology DG ABD ACUTE 2+V W 1V CHEST Result Date: 08/07/2023 CLINICAL DATA:  Abdominal pain for 3 days EXAM: DG  ABDOMEN ACUTE WITH 1 VIEW CHEST COMPARISON:  01/19/2023 chest radiograph FINDINGS: Cluster of pills over the stomach. The bowel gas pattern is normal. No concerning mass effect or calcification. Cholecystectomy clips. Elevated right diaphragm. There is no edema, consolidation,  effusion, or pneumothorax. Stable heart size and mediastinal contours. IMPRESSION: No acute finding. Electronically Signed   By: Tiburcio Pea M.D.   On: 08/07/2023 05:42    Medications Ordered in ED Medications  alum & mag hydroxide-simeth (MAALOX/MYLANTA) 200-200-20 MG/5ML suspension 30 mL (30 mLs Oral Given 08/07/23 0459)  potassium chloride SA (KLOR-CON M) CR tablet 40 mEq (40 mEq Oral Given 08/07/23 0459)   Procedures Procedures  (including critical care time) Medical Decision Making / ED Course   Medical Decision Making Amount and/or Complexity of Data Reviewed Labs: ordered. Radiology: ordered.  Risk OTC drugs. Prescription drug management.    Abdominal pain  Differential diagnosis considered and workup below  CBC without leukocytosis or anemia.  CMP with hypokalemia 2.8 likely secondary to bowel movements status post laxative use.  No other electrolyte derangements or renal sufficiency.  No evidence of bili obstruction or pancreatitis.  UA without evidence of infection.  Low suspicion for serious intra-abdominal inflammatory/infectious process.  Recent CT in January of last year did not reveal evidence of mass or renal stones.  Acute abdominal series with normal gas pattern not concerning for obstruction.  Patient able to tolerate p.o. intake. Maalox and kdur given.     Final Clinical Impression(s) / ED Diagnoses Final diagnoses:  Generalized abdominal pain  Hypokalemia   The patient appears reasonably screened and/or stabilized for discharge and I doubt any other medical condition or other [facility] requiring further screening, evaluation, or treatment in the ED at this time. I have discussed the findings, Dx and Tx plan with the patient/family who expressed understanding and agree(s) with the plan. Discharge instructions discussed at length. The patient/family was given strict return precautions who verbalized understanding of the instructions. No further questions at  time of discharge.  Disposition: Discharge  Condition: Good  ED Discharge Orders          Ordered    hyoscyamine (LEVSIN/SL) 0.125 MG SL tablet  Every 6 hours PRN        08/07/23 0659    potassium chloride (KLOR-CON) 10 MEQ tablet  Daily        08/07/23 0659             Follow Up: Rema Fendt, NP [address] [place] [address] [phone]  Call  to schedule an appointment for close follow up to recheck potassium level    This chart was dictated using voice recognition software.  Despite best efforts to proofread,  errors can occur which can change the documentation meaning.    Nira Conn, MD 08/07/23 [phone]

## 2023-08-07 NOTE — ED Triage Notes (Signed)
Patient c/o abdominal pain x 3 days. Patient worsening pain tonight. Patient denies N/V. Patient denies fever.

## 2023-08-22 ENCOUNTER — Encounter: Payer: Self-pay | Admitting: Gastroenterology

## 2023-08-24 ENCOUNTER — Ambulatory Visit (AMBULATORY_SURGERY_CENTER): Payer: 59 | Admitting: Gastroenterology

## 2023-08-24 ENCOUNTER — Encounter: Payer: Self-pay | Admitting: Gastroenterology

## 2023-08-24 VITALS — BP 117/75 | HR 73 | Temp 98.1°F | Resp 14 | Ht 66.0 in | Wt 230.0 lb

## 2023-08-24 DIAGNOSIS — Z1211 Encounter for screening for malignant neoplasm of colon: Secondary | ICD-10-CM | POA: Diagnosis present

## 2023-08-24 MED ORDER — SODIUM CHLORIDE 0.9 % IV SOLN: 500.0000 mL | INTRAVENOUS | Status: DC

## 2023-08-24 NOTE — Op Note (Signed)
 [facility] Patient Name: Audrey Avery Procedure Date: 08/24/2023 11:07 AM MRN: [id number] Endoscopist: Lorin Picket E. Tomasa Rand , MD, [id number] Age: 53 Referring MD:  Date of Birth: [birth year] Gender: Female Account #: [id number] Procedure:                Colonoscopy Indications:              Screening for colorectal malignant neoplasm, This                            is the patient's first colonoscopy Medicines:                Monitored Anesthesia Care Procedure:                Pre-Anesthesia Assessment:                           - Prior to the procedure, a History and Physical                            was performed, and patient medications and                            allergies were reviewed. The patient's tolerance of                            previous anesthesia was also reviewed. The risks                            and benefits of the procedure and the sedation                            options and risks were discussed with the patient.                            All questions were answered, and informed consent                            was obtained. Prior Anticoagulants: The patient has                            taken no anticoagulant or antiplatelet agents. ASA                            Grade Assessment: II - A patient with mild systemic                            disease. After reviewing the risks and benefits,                            the patient was deemed in satisfactory condition to                            undergo the procedure.  After obtaining informed consent, the colonoscope                            was passed under direct vision. Throughout the                            procedure, the patient's blood pressure, pulse, and                            oxygen saturations were monitored continuously. The                            CF [id number] #[id number] was introduced through the anus                            and advanced  to the the cecum, identified by                            appendiceal orifice and ileocecal valve. The                            colonoscopy was performed without difficulty. The                            patient tolerated the procedure fairly well, but                            she have upper airway obstruction with frequent                            coughing, sneezing and labored breathing. The                            quality of the bowel preparation was adequate. The                            ileocecal valve, appendiceal orifice, and rectum                            were photographed. The bowel preparation used was                            SUFLAVE via split dose instruction. Scope In: 11:23:44 AM Scope Out: 11:40:05 AM Scope Withdrawal Time: 0 hours 12 minutes 33 seconds  Total Procedure Duration: 0 hours 16 minutes 21 seconds  Findings:                 The perianal and digital rectal examinations were                            normal. Pertinent negatives include normal                            sphincter tone and no palpable rectal lesions.  The colon (entire examined portion) appeared normal.                           The retroflexed view of the distal rectum and anal                            verge was normal and showed no anal or rectal                            abnormalities. Complications:            No immediate complications. Estimated Blood Loss:     Estimated blood loss: none. Impression:               - The entire examined colon is normal.                           - The distal rectum and anal verge are normal on                            retroflexion view.                           - No specimens collected. Recommendation:           - Patient has a contact number available for                            emergencies. The signs and symptoms of potential                            delayed complications were discussed with the                             patient. Return to normal activities tomorrow.                            Written discharge instructions were provided to the                            patient.                           - Resume previous diet.                           - Continue present medications.                           - Repeat colonoscopy in 10 years for screening                            purposes. Masiyah Jorstad E. Tomasa Rand, MD 08/24/2023 11:47:40 AM This report has been signed electronically.

## 2023-08-24 NOTE — Progress Notes (Signed)
 [facility] History and Physical   Primary Care Physician:  Rema Fendt, NP   Reason for Procedure:   Colon cancer screening  Plan:    Screening colonoscopy     HPI: Audrey Avery is a 53 y.o. female undergoing initial average risk screening colonoscopy.  She has no family history of colon cancer and no chronic GI symptoms.    Past Medical History:  Diagnosis Date   Anemia    Chronic cough 05/2018   With intermittent cough related syncope   Diabetes mellitus without complication (HCC)    Hyperlipidemia    Hypertension    Morbid obesity with BMI of 40.0-44.9, adult (HCC)    Obstructive sleep apnea of adult 2021   Recently diagnosed.  AHI 127.8, O2 nadir 51% (severe OSA) CPAP titration pending   PSVT (paroxysmal supraventricular tachycardia) (HCC) 09/2019   Cardiac monitor showed 62 episodes of short bursts of SVT ranging from 4-19 beats.   Sleep apnea    Syncope, tussive     Past Surgical History:  Procedure Laterality Date   ABDOMINAL HYSTERECTOMY     BREAST SURGERY     CESAREAN SECTION     CHOLECYSTECTOMY     TRANSTHORACIC ECHOCARDIOGRAM  09/2019   EF 60 to 65%.  Mild LVH,  normal valves..  Normal echo    Prior to Admission medications   Medication Sig Start Date End Date Taking? Authorizing Provider  amLODipine (NORVASC) 10 MG tablet Take 1 tablet (10 mg total) by mouth daily. 06/30/23  Yes Zonia Kief, Amy J, NP  atorvastatin (LIPITOR) 20 MG tablet Take 1 tablet (20 mg total) by mouth daily. 06/30/23  Yes Zonia Kief, Amy J, NP  estradiol (VIVELLE-DOT) 0.1 MG/24HR patch Place 1 patch (0.1 mg total) onto the skin 2 (two) times a week. 07/13/23  Yes Chrzanowski, Jami B, NP  hydrochlorothiazide (HYDRODIURIL) 50 MG tablet Take 1 tablet (50 mg total) by mouth daily. 06/30/23 09/28/23 Yes Rema Fendt, NP  metFORMIN (GLUCOPHAGE-XR) 500 MG 24 hr tablet Take 1 tablet (500 mg total) by mouth daily with breakfast. 06/30/23 09/28/23 Yes Zonia Kief, Amy J, NP   hyoscyamine (LEVSIN/SL) 0.125 MG SL tablet Place 1 tablet (0.125 mg total) under the tongue every 6 (six) hours as needed for up to 5 days. 08/07/23 08/12/23  Nira Conn, MD  nystatin (MYCOSTATIN/NYSTOP) powder Apply 1 Application topically 3 (three) times daily. 12/01/22   Rema Fendt, NP  potassium chloride (KLOR-CON) 10 MEQ tablet Take 2 tablets (20 mEq total) by mouth daily for 7 days. 08/07/23 08/14/23  Nira Conn, MD    Current Outpatient Medications  Medication Sig Dispense Refill   amLODipine (NORVASC) 10 MG tablet Take 1 tablet (10 mg total) by mouth daily. 90 tablet 0   atorvastatin (LIPITOR) 20 MG tablet Take 1 tablet (20 mg total) by mouth daily. 90 tablet 0   estradiol (VIVELLE-DOT) 0.1 MG/24HR patch Place 1 patch (0.1 mg total) onto the skin 2 (two) times a week. 8 patch 12   hydrochlorothiazide (HYDRODIURIL) 50 MG tablet Take 1 tablet (50 mg total) by mouth daily. 90 tablet 0   metFORMIN (GLUCOPHAGE-XR) 500 MG 24 hr tablet Take 1 tablet (500 mg total) by mouth daily with breakfast. 90 tablet 0   hyoscyamine (LEVSIN/SL) 0.125 MG SL tablet Place 1 tablet (0.125 mg total) under the tongue every 6 (six) hours as needed for up to 5 days. 30 tablet 0   nystatin (MYCOSTATIN/NYSTOP) powder Apply 1 Application topically  3 (three) times daily. 60 g 2   potassium chloride (KLOR-CON) 10 MEQ tablet Take 2 tablets (20 mEq total) by mouth daily for 7 days. 14 tablet 0   Current Facility-Administered Medications  Medication Dose Route Frequency Provider Last Rate Last Admin   0.9 %  sodium chloride infusion  500 mL Intravenous Continuous Jenel Lucks, MD        Allergies as of 08/24/2023 - Review Complete 08/24/2023  Allergen Reaction Noted   Penicillins Anaphylaxis and Swelling 01/16/2013    Family History  Problem Relation Age of Onset   Cancer Mother    Hypertension Mother    Diabetes Father    Cancer Father    Hypertension Father    Heart attack  Father 60   CAD Father 81       She does not know details   Cancer Sister    Hypertension Sister    Colon polyps Sister    Colon polyps Sister    Colon cancer Neg Hx    Esophageal cancer Neg Hx    Rectal cancer Neg Hx    Stomach cancer Neg Hx     Social History   Socioeconomic History   Marital status: Single    Spouse name: Not on file   Number of children: Not on file   Years of education: Not on file   Highest education level: Associate degree: occupational, Scientist, product/process development, or vocational program  Occupational History    Employer: [employer]  Tobacco Use   Smoking status: Never    Passive exposure: Past   Smokeless tobacco: Never  Vaping Use   Vaping status: Never Used  Substance and Sexual Activity   Alcohol use: Yes    Comment: occasionally   Drug use: No   Sexual activity: Yes    Partners: Male    Birth control/protection: Post-menopausal    Comment: menarche 53yo, sexual debut 53yo  Other Topics Concern   Not on file  Social History Narrative   Not on file   Social Drivers of Health   Financial Resource Strain: Medium Risk (06/27/2023)   Overall Financial Resource Strain (CARDIA)    Difficulty of Paying Living Expenses: Somewhat hard  Food Insecurity: Food Insecurity Present (06/27/2023)   Hunger Vital Sign    Worried About Running Out of Food in the Last Year: Often true    Ran Out of Food in the Last Year: Sometimes true  Transportation Needs: No Transportation Needs (06/27/2023)   PRAPARE - Administrator, Civil Service (Medical): No    Lack of Transportation (Non-Medical): No  Physical Activity: Inactive (06/27/2023)   Exercise Vital Sign    Days of Exercise per Week: 0 days    Minutes of Exercise per Session: 60 min  Stress: No Stress Concern Present (06/27/2023)   [employer] of Occupational Health - Occupational Stress Questionnaire    Feeling of Stress : Not at all  Social Connections: Moderately Integrated (06/27/2023)   Social  Connection and Isolation Panel [NHANES]    Frequency of Communication with Friends and Family: More than three times a week    Frequency of Social Gatherings with Friends and Family: Never    Attends Religious Services: More than 4 times per year    Active Member of [employer] or Organizations: No    Attends Engineer, structural: Not on file    Marital Status: Living with partner  Intimate Partner Violence: Not on file    Review  of Systems:  All other review of systems negative except as mentioned in the HPI.  Physical Exam: Vital signs BP (!) 147/95   Pulse 74   Temp 98.1 F (36.7 C)   Resp 16   Ht 5\' 6"  (1.676 m)   Wt 230 lb (104.3 kg)   SpO2 100%   BMI 37.12 kg/m   General:   Alert,  Well-developed, well-nourished, pleasant and cooperative in NAD Airway:  Mallampati 3 Lungs:  Clear throughout to auscultation.   Heart:  Regular rate and rhythm; no murmurs, clicks, rubs,  or gallops. Abdomen:  Soft, nontender and nondistended. Normal bowel sounds.   Neuro/Psych:  Normal mood and affect. A and O x 3   Britiny Defrain E. Tomasa Rand, MD [facility]

## 2023-08-24 NOTE — Progress Notes (Signed)
Pt states no changes to health hx since previsit

## 2023-08-24 NOTE — Progress Notes (Signed)
 Report to PACU, RN, vss, BBS= Clear.

## 2023-08-24 NOTE — Patient Instructions (Addendum)

## 2023-08-25 ENCOUNTER — Telehealth: Payer: Self-pay | Admitting: *Deleted

## 2023-08-25 NOTE — Telephone Encounter (Signed)
 Post procedure follow up call placed, no answer and left VM.

## 2023-09-29 ENCOUNTER — Encounter: Payer: Self-pay | Admitting: Family

## 2023-09-29 ENCOUNTER — Ambulatory Visit: Payer: 59 | Admitting: Family

## 2023-09-29 VITALS — BP 130/84 | HR 78 | Temp 98.5°F | Resp 16 | Ht 66.0 in | Wt 231.8 lb

## 2023-09-29 DIAGNOSIS — E785 Hyperlipidemia, unspecified: Secondary | ICD-10-CM

## 2023-09-29 DIAGNOSIS — E1165 Type 2 diabetes mellitus with hyperglycemia: Secondary | ICD-10-CM

## 2023-09-29 DIAGNOSIS — I10 Essential (primary) hypertension: Secondary | ICD-10-CM

## 2023-09-29 DIAGNOSIS — Z7984 Long term (current) use of oral hypoglycemic drugs: Secondary | ICD-10-CM

## 2023-09-29 MED ORDER — ATORVASTATIN CALCIUM 20 MG PO TABS
20.0000 mg | ORAL_TABLET | Freq: Every day | ORAL | 0 refills | Status: DC
Start: 2023-09-29 — End: 2023-12-28

## 2023-09-29 MED ORDER — METFORMIN HCL ER 500 MG PO TB24: 500.0000 mg | ORAL_TABLET | Freq: Every day | ORAL | 0 refills | Status: DC

## 2023-09-29 MED ORDER — HYDROCHLOROTHIAZIDE 50 MG PO TABS: 50.0000 mg | ORAL_TABLET | Freq: Every day | ORAL | 0 refills | Status: DC

## 2023-09-29 MED ORDER — AMLODIPINE BESYLATE 10 MG PO TABS
10.0000 mg | ORAL_TABLET | Freq: Every day | ORAL | 0 refills | Status: DC
Start: 2023-09-29 — End: 2023-12-28

## 2023-09-29 NOTE — Progress Notes (Signed)
 Patient ID: Audrey Avery, female    DOB: 1970-10-23  MRN: [id number]  CC: Chronic Conditions Follow-Up  Subjective: Audrey Avery is a 53 y.o. female who presents for chronic conditions follow-up.   Her concerns today include:  - Doing well on Amlodipine and Hydrochlorothiazide, no issues/concerns. She does not complain of red flag symptoms such as but not limited to chest pain, shortness of breath, worst headache of life, nausea/vomiting.  - Doing well on Metformin XR, no issues/concerns. She denies red flag symptoms associated with diabetes.  - Reports upcoming appointment for diabetic eye exam. - Doing well on Atorvastatin, no issues/concerns.   Patient Active Problem List   Diagnosis Date Noted   Hyperlipidemia 05/21/2022   Essential hypertension 10/02/2019   Syncope and collapse 09/30/2019   DOE (dyspnea on exertion) 09/30/2019   OSA (obstructive sleep apnea) 08/02/2019   Anemia 08/15/2012   Menorrhagia 08/15/2012   Refusal of blood transfusions as patient is Jehovah's Witness 08/15/2012     Current Outpatient Medications on File Prior to Visit  Medication Sig Dispense Refill   estradiol (VIVELLE-DOT) 0.1 MG/24HR patch Place 1 patch (0.1 mg total) onto the skin 2 (two) times a week. 8 patch 12   nystatin (MYCOSTATIN/NYSTOP) powder Apply 1 Application topically 3 (three) times daily. 60 g 2   potassium chloride (KLOR-CON) 10 MEQ tablet Take 2 tablets (20 mEq total) by mouth daily for 7 days. 14 tablet 0   No current facility-administered medications on file prior to visit.    Allergies  Allergen Reactions   Penicillins Anaphylaxis and Swelling    Has patient had a PCN reaction causing immediate rash, facial/tongue/throat swelling, SOB or lightheadedness with hypotension: Yes Has patient had a PCN reaction causing severe rash involving mucus membranes or skin necrosis: No Has patient had a PCN reaction that required hospitalization No Has patient had a PCN  reaction occurring within the last 10 years: No If all of the above answers are "NO", then may proceed with Cephalosporin use.     Social History   Socioeconomic History   Marital status: Single    Spouse name: Not on file   Number of children: Not on file   Years of education: Not on file   Highest education level: Associate degree: occupational, Scientist, product/process development, or vocational program  Occupational History    Employer: [employer]  Tobacco Use   Smoking status: Never    Passive exposure: Past   Smokeless tobacco: Never  Vaping Use   Vaping status: Never Used  Substance and Sexual Activity   Alcohol use: Yes    Comment: occasionally   Drug use: No   Sexual activity: Yes    Partners: Male    Birth control/protection: Post-menopausal    Comment: menarche 53yo, sexual debut 53yo  Other Topics Concern   Not on file  Social History Narrative   Not on file   Social Drivers of Health   Financial Resource Strain: Medium Risk (06/27/2023)   Overall Financial Resource Strain (CARDIA)    Difficulty of Paying Living Expenses: Somewhat hard  Food Insecurity: Food Insecurity Present (06/27/2023)   Hunger Vital Sign    Worried About Running Out of Food in the Last Year: Often true    Ran Out of Food in the Last Year: Sometimes true  Transportation Needs: No Transportation Needs (06/27/2023)   PRAPARE - Administrator, Civil Service (Medical): No    Lack of Transportation (Non-Medical): No  Physical  Activity: Inactive (06/27/2023)   Exercise Vital Sign    Days of Exercise per Week: 0 days    Minutes of Exercise per Session: 60 min  Stress: No Stress Concern Present (06/27/2023)   [employer] of Occupational Health - Occupational Stress Questionnaire    Feeling of Stress : Not at all  Social Connections: Moderately Integrated (06/27/2023)   Social Connection and Isolation Panel [NHANES]    Frequency of Communication with Friends and Family: More than three times a  week    Frequency of Social Gatherings with Friends and Family: Never    Attends Religious Services: More than 4 times per year    Active Member of [employer] or Organizations: No    Attends Engineer, structural: Not on file    Marital Status: Living with partner  Intimate Partner Violence: Not At Risk (09/29/2023)   Humiliation, Afraid, Rape, and Kick questionnaire    Fear of Current or Ex-Partner: No    Emotionally Abused: No    Physically Abused: No    Sexually Abused: No    Family History  Problem Relation Age of Onset   Cancer Mother    Hypertension Mother    Diabetes Father    Cancer Father    Hypertension Father    Heart attack Father 44   CAD Father 2       She does not know details   Cancer Sister    Hypertension Sister    Colon polyps Sister    Colon polyps Sister    Colon cancer Neg Hx    Esophageal cancer Neg Hx    Rectal cancer Neg Hx    Stomach cancer Neg Hx     Past Surgical History:  Procedure Laterality Date   ABDOMINAL HYSTERECTOMY     BREAST SURGERY     CESAREAN SECTION     CHOLECYSTECTOMY     TRANSTHORACIC ECHOCARDIOGRAM  09/2019   EF 60 to 65%.  Mild LVH,  normal valves..  Normal echo    ROS: Review of Systems Negative except as stated above  PHYSICAL EXAM: BP 130/84   Pulse 78   Temp 98.5 F (36.9 C) (Oral)   Resp 16   Ht 5\' 6"  (1.676 m)   Wt 231 lb 12.8 oz (105.1 kg)   SpO2 95%   BMI 37.41 kg/m   Physical Exam HENT:     Head: Normocephalic and atraumatic.     Nose: Nose normal.     Mouth/Throat:     Mouth: Mucous membranes are moist.     Pharynx: Oropharynx is clear.  Eyes:     Extraocular Movements: Extraocular movements intact.     Conjunctiva/sclera: Conjunctivae normal.     Pupils: Pupils are equal, round, and reactive to light.  Cardiovascular:     Rate and Rhythm: Normal rate and regular rhythm.     Pulses: Normal pulses.     Heart sounds: Normal heart sounds.  Pulmonary:     Effort: Pulmonary effort is  normal.     Breath sounds: Normal breath sounds.  Musculoskeletal:        General: Normal range of motion.     Cervical back: Normal range of motion and neck supple.  Neurological:     General: No focal deficit present.     Mental Status: She is alert and oriented to person, place, and time.  Psychiatric:        Mood and Affect: Mood normal.  Behavior: Behavior normal.     ASSESSMENT AND PLAN: 1. Primary hypertension (Primary) - Continue Amlodipine and Hydrochlorothiazide as prescribed. - Counseled on blood pressure goal of less than 130/80, low-sodium, DASH diet, medication compliance, and 150 minutes of moderate intensity exercise per week as tolerated. Counseled on medication adherence and adverse effects. - Follow-up with primary provider in 3 months or sooner if needed. - amLODipine (NORVASC) 10 MG tablet; Take 1 tablet (10 mg total) by mouth daily.  Dispense: 90 tablet; Refill: 0 - hydrochlorothiazide (HYDRODIURIL) 50 MG tablet; Take 1 tablet (50 mg total) by mouth daily.  Dispense: 90 tablet; Refill: 0  2. Type 2 diabetes mellitus with hyperglycemia, without long-term current use of insulin (HCC) - Continue Metformin XR as prescribed.  - Hemoglobin A1c result pending.  - Discussed the importance of healthy eating habits, low-carbohydrate diet, low-sugar diet, regular aerobic exercise (at least 150 minutes a week as tolerated) and medication compliance to achieve or maintain control of diabetes. Counseled on medication adherence/adverse effects.  - Follow-up with primary provider as scheduled. - metFORMIN (GLUCOPHAGE-XR) 500 MG 24 hr tablet; Take 1 tablet (500 mg total) by mouth daily with breakfast.  Dispense: 90 tablet; Refill: 0 - Hemoglobin A1c  3. Hyperlipidemia, unspecified hyperlipidemia type - Continue Atorvastatin as prescribed. Counseled on medication adherence/adverse effects. - Routine screening.  - Follow-up with primary provider as scheduled. - atorvastatin  (LIPITOR) 20 MG tablet; Take 1 tablet (20 mg total) by mouth daily.  Dispense: 90 tablet; Refill: 0 - Lipid panel   Patient was given the opportunity to ask questions.  Patient verbalized understanding of the plan and was able to repeat key elements of the plan. Patient was given clear instructions to go to Emergency Department or return to medical center if symptoms don't improve, worsen, or new problems develop.The patient verbalized understanding.   Orders Placed This Encounter  Procedures   Hemoglobin A1c   Lipid panel     Requested Prescriptions   Signed Prescriptions Disp Refills   amLODipine (NORVASC) 10 MG tablet 90 tablet 0    Sig: Take 1 tablet (10 mg total) by mouth daily.   atorvastatin (LIPITOR) 20 MG tablet 90 tablet 0    Sig: Take 1 tablet (20 mg total) by mouth daily.   metFORMIN (GLUCOPHAGE-XR) 500 MG 24 hr tablet 90 tablet 0    Sig: Take 1 tablet (500 mg total) by mouth daily with breakfast.   hydrochlorothiazide (HYDRODIURIL) 50 MG tablet 90 tablet 0    Sig: Take 1 tablet (50 mg total) by mouth daily.    Return in about 3 months (around 12/29/2023) for Follow-Up or next available chronic conditions.  Rema Fendt, NP

## 2023-09-29 NOTE — Progress Notes (Signed)
 No concerns.

## 2023-09-30 LAB — LIPID PANEL
Chol/HDL Ratio: 3.2 ratio (ref 0.0–4.4)
Cholesterol, Total: 125 mg/dL (ref 100–199)
HDL: 39 mg/dL — ABNORMAL LOW (ref >39)
LDL Chol Calc (NIH): 51 mg/dL (ref 0–99)
Triglycerides: 221 mg/dL — ABNORMAL HIGH (ref 0–149)
VLDL Cholesterol Cal: 35 mg/dL (ref 5–40)

## 2023-09-30 LAB — HEMOGLOBIN A1C
Est. average glucose Bld gHb Est-mCnc: 137 mg/dL
Hgb A1c MFr Bld: 6.4 % — ABNORMAL HIGH (ref 4.8–5.6)

## 2023-10-02 ENCOUNTER — Encounter: Payer: Self-pay | Admitting: Family

## 2023-10-02 ENCOUNTER — Other Ambulatory Visit: Payer: Self-pay | Admitting: Family

## 2023-10-02 DIAGNOSIS — E785 Hyperlipidemia, unspecified: Secondary | ICD-10-CM

## 2023-12-08 ENCOUNTER — Encounter: Payer: Self-pay | Admitting: Radiology

## 2023-12-08 ENCOUNTER — Ambulatory Visit (INDEPENDENT_AMBULATORY_CARE_PROVIDER_SITE_OTHER): Payer: 59 | Admitting: Radiology

## 2023-12-08 ENCOUNTER — Other Ambulatory Visit (HOSPITAL_COMMUNITY)
Admission: RE | Admit: 2023-12-08 | Discharge: 2023-12-08 | Disposition: A | Discharge status: Home / Self Care | Source: Ambulatory Visit | Attending: Radiology | Admitting: Radiology

## 2023-12-08 VITALS — BP 116/72 | Ht 66.0 in | Wt 231.4 lb

## 2023-12-08 DIAGNOSIS — Z01419 Encounter for gynecological examination (general) (routine) without abnormal findings: Secondary | ICD-10-CM | POA: Diagnosis not present

## 2023-12-08 DIAGNOSIS — E119 Type 2 diabetes mellitus without complications: Secondary | ICD-10-CM | POA: Diagnosis not present

## 2023-12-08 DIAGNOSIS — G4733 Obstructive sleep apnea (adult) (pediatric): Secondary | ICD-10-CM

## 2023-12-08 DIAGNOSIS — Z1331 Encounter for screening for depression: Secondary | ICD-10-CM

## 2023-12-08 DIAGNOSIS — N951 Menopausal and female climacteric states: Secondary | ICD-10-CM | POA: Diagnosis not present

## 2023-12-08 MED ORDER — ESTRADIOL 0.1 MG/24HR TD PTTW: 1.0000 | MEDICATED_PATCH | TRANSDERMAL | 12 refills | Status: AC

## 2023-12-08 MED ORDER — MOUNJARO 2.5 MG/0.5ML ~~LOC~~ SOAJ: 2.5000 mg | SUBCUTANEOUS | 0 refills | Status: DC

## 2023-12-08 NOTE — Progress Notes (Signed)
 Audrey Avery 03-Feb-1971 [id number]   History:  53 y.o. G4P2 presents for annual exam. Frustrtaed her diabetes is not controlled on metformin . She is eating a low card/sugar diet. Having trouble losing weight as well. She does exercise. Still having hot flashes and trouble sleeping. Has a CPAP, needs referral to go back to pulmonology because it has been more than 3 years. AHI was 44.7 in 2021  Gynecologic History Hysterectomy  Sexually active: yes  OB History     Gravida  4   Para  2   Term      Preterm      AB      Living  2      SAB      IAB      Ectopic      Multiple      Live Births  2            Health Maintenance Last Pap: 2014. Results were: normal Last mammogram: 2024. Results were: normal Last colonoscopy: 08/2023. Results were: repeat 10 years      12/08/2023   11:44 AM 09/29/2023    1:34 PM 04/28/2023    2:59 PM  Depression screen PHQ 2/9  Decreased Interest 0 0 0  Down, Depressed, Hopeless 0 0 0  PHQ - 2 Score 0 0 0     Past medical history, past surgical history, family history and social history were all reviewed and documented in the EPIC chart.  ROS:  A ROS was performed and pertinent positives and negatives are included.  Exam:  Vitals:   12/08/23 1144  BP: 116/72  Weight: 231 lb 6.4 oz (105 kg)  Height: 5' 6 (1.676 m)   Body mass index is 37.35 kg/m.  General appearance:  Normal, obese Thyroid :  Symmetrical, normal in size, without palpable masses or nodularity. Respiratory  Auscultation:  Clear without wheezing or rhonchi Cardiovascular  Auscultation:  Regular rate, without rubs, murmurs or gallops  Edema/varicosities:  Not grossly evident Abdominal  Soft,nontender, without masses, guarding or rebound.  Liver/spleen:  No organomegaly noted  Hernia:  None appreciated  Skin  Inspection:  Grossly normal Breasts: Examined lying and sitting.   Right: Without masses, retractions, nipple discharge or axillary  adenopathy.   Left: Without masses, retractions, nipple discharge or axillary adenopathy. Genitourinary   Inguinal/mons:  Normal without inguinal adenopathy  External genitalia:  Normal appearing vulva with no masses, tenderness, or lesions  BUS/Urethra/Skene's glands:  Normal  Vagina:  Normal appearing with normal color and discharge, no lesions. Atrophy mild  Cervix:  absent  Uterus:  absent  Adnexa/parametria:     Rt: Normal in size, without masses or tenderness.   Lt: Normal in size, without masses or tenderness.  Anus and perineum: Normal   [name], CMA present for exam  Assessment/Plan:   1. Well woman exam with routine gynecological exam (Primary) - Schedule mammogram - Cytology - PAP( [place] [facility])  2. Vasomotor symptoms due to menopause Continue current dose, if hot flashes persist will add progesterone - estradiol  (VIVELLE -DOT) 0.1 MG/24HR patch; Place 1 patch (0.1 mg total) onto the skin 2 (two) times a week.  Dispense: 8 patch; Refill: 12  3. Diabetes mellitus type 2, noninsulin dependent (HCC) HgbA1c 6.4, on metformin  without improvement - tirzepatide (MOUNJARO) 2.5 MG/0.5ML Pen; Inject 2.5 mg into the skin once a week.  Dispense: 2 mL; Refill: 0  4. Obstructive sleep apnea syndrome Needs referral to reestablish needs a new CPAP  AHI was 44.7 in 2021 - Ambulatory referral to Pulmonology    Return in 1 year for annual or sooner prn.  Synetta Eves B WHNP-BC 12:09 PM 12/08/2023

## 2023-12-11 ENCOUNTER — Telehealth: Admitting: Physician Assistant

## 2023-12-11 DIAGNOSIS — R3989 Other symptoms and signs involving the genitourinary system: Secondary | ICD-10-CM | POA: Diagnosis not present

## 2023-12-11 MED ORDER — NITROFURANTOIN MONOHYD MACRO 100 MG PO CAPS: 100.0000 mg | ORAL_CAPSULE | Freq: Two times a day (BID) | ORAL | 0 refills | Status: DC

## 2023-12-11 NOTE — Progress Notes (Signed)

## 2023-12-12 ENCOUNTER — Encounter: Payer: Self-pay | Admitting: Nurse Practitioner

## 2023-12-12 ENCOUNTER — Ambulatory Visit (INDEPENDENT_AMBULATORY_CARE_PROVIDER_SITE_OTHER): Admitting: Nurse Practitioner

## 2023-12-12 ENCOUNTER — Ambulatory Visit

## 2023-12-12 VITALS — BP 123/77 | HR 68 | Ht 66.0 in | Wt 233.6 lb

## 2023-12-12 DIAGNOSIS — Z9989 Dependence on other enabling machines and devices: Secondary | ICD-10-CM

## 2023-12-12 DIAGNOSIS — E66812 Obesity, class 2: Secondary | ICD-10-CM | POA: Insufficient documentation

## 2023-12-12 DIAGNOSIS — G4733 Obstructive sleep apnea (adult) (pediatric): Secondary | ICD-10-CM

## 2023-12-12 DIAGNOSIS — Z6837 Body mass index (BMI) 37.0-37.9, adult: Secondary | ICD-10-CM | POA: Diagnosis not present

## 2023-12-12 NOTE — Assessment & Plan Note (Signed)
BMI 37. Healthy weight loss encouraged.  

## 2023-12-12 NOTE — Progress Notes (Signed)
 @Patient  ID: Audrey Avery, female    DOB: 1971-04-03, 53 y.o.   MRN: [id number]  Chief Complaint  Patient presents with   Consult    Needs a new machine     Referring provider: Ginette Shasta NOVAK, NP  HPI: 53 year old female, never smoker followed for OSA on CPAP. She was last seen in office 09/08/2020. Past medical history significant for HTN, obesity, HLD.   TEST/EVENTS:  08/01/2019 PSG: AHI 127.8/h, SpO2 low 51%  09/08/2020: OV with Parrett NP. Very severe OSA on CPAP. Swallowing a lot of air. After she takes CPAP off, she has fullness in her chest and epigastric area. Some throat dryness. Excellent compliance on CPAP. Feels better since she started to use it. Less sleepy. Chronic cough resolved. Does shift work. Adjust CPAP to 5-15 cmH2O.  12/12/2023: Today - follow up Discussed the use of AI scribe software for clinical note transcription with the patient, who gave verbal consent to proceed.  History of Present Illness   Audrey Avery is a 53 year old female with severe sleep apnea who presents for CPAP management and follow-up.  She has been using a CPAP machine since March 2021, with consistent nightly use. She is wondering if there are any alternative options to manage her sleep apnea besides CPAP. She fights with the hose at night and just finds it frustrating to use at times. She does continues to use it as it effectively manages her symptoms, including severe migraines and daytime fatigue. Feels better rested with it. No further issues with pressure settings. No drowsy driving.   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 Lives with her boyfriend. She experiences adequate sleep quality and energy levels during the day, attributing this to her CPAP use. No alcohol intake or use of sleep medications.  She has recently started Mounjaro  for diabetes and obesity management. Her current  weight is 233 pounds, down from 242 pounds at the time of her sleep study. She is tolerating the medication well so far.   She has a history of snoring since a young age, initially thought to be hereditary.   11/11/2023-12/10/2023: CPAP 5-15 cmH2O 30/30 days; 97% >4 hr; average use 8 hr 7 min Pressure 95th 13.6 Leaks 95th 1.8 AHI 1.6      Allergies  Allergen Reactions   Penicillins Anaphylaxis and Swelling    Has patient had a PCN reaction causing immediate rash, facial/tongue/throat swelling, SOB or lightheadedness with hypotension: Yes Has patient had a PCN reaction causing severe rash involving mucus membranes or skin necrosis: No Has patient had a PCN reaction that required hospitalization No Has patient had a PCN reaction occurring within the last 10 years: No If all of the above answers are NO, then may proceed with Cephalosporin use.     Immunization History  Administered Date(s) Administered   PFIZER(Purple Top)SARS-COV-2 Vaccination 01/11/2020, 02/01/2020   PPD Test 06/17/2016, 09/06/2019    Past Medical History:  Diagnosis Date   Anemia    Chronic cough 05/2018   With intermittent cough related syncope   Diabetes mellitus without complication (HCC)    Hyperlipidemia    Hypertension    Morbid obesity with BMI of 40.0-44.9, adult (HCC)    Obstructive sleep apnea of adult 2021   Recently diagnosed.  AHI 127.8, O2 nadir 51% (severe OSA) CPAP titration pending   PSVT (paroxysmal supraventricular  tachycardia) (HCC) 09/2019   Cardiac monitor showed 62 episodes of short bursts of SVT ranging from 4-19 beats.   Sleep apnea    Syncope, tussive     Tobacco History: Social History   Tobacco Use  Smoking Status Never   Passive exposure: Past  Smokeless Tobacco Never   Counseling given: Not Answered   Outpatient Medications Prior to Visit  Medication Sig Dispense Refill   amLODipine  (NORVASC ) 10 MG tablet Take 1 tablet (10 mg total) by mouth daily. 90 tablet 0    atorvastatin  (LIPITOR) 20 MG tablet Take 1 tablet (20 mg total) by mouth daily. 90 tablet 0   estradiol  (VIVELLE -DOT) 0.1 MG/24HR patch Place 1 patch (0.1 mg total) onto the skin 2 (two) times a week. 8 patch 12   hydrochlorothiazide  (HYDRODIURIL ) 50 MG tablet Take 1 tablet (50 mg total) by mouth daily. 90 tablet 0   metFORMIN  (GLUCOPHAGE -XR) 500 MG 24 hr tablet Take 1 tablet (500 mg total) by mouth daily with breakfast. 90 tablet 0   nitrofurantoin, macrocrystal-monohydrate, (MACROBID) 100 MG capsule Take 1 capsule (100 mg total) by mouth 2 (two) times daily. 10 capsule 0   nystatin  (MYCOSTATIN /NYSTOP ) powder Apply 1 Application topically 3 (three) times daily. 60 g 2   tirzepatide  (MOUNJARO ) 2.5 MG/0.5ML Pen Inject 2.5 mg into the skin once a week. 2 mL 0   No facility-administered medications prior to visit.     Review of Systems:   Constitutional: No weight loss or gain, night sweats, fevers, chills, or lassitude. +occasional fatigue  HEENT: No headaches, difficulty swallowing, tooth/dental problems, or sore throat. No sneezing, itching, ear ache, nasal congestion, or post nasal drip CV:  No chest pain, orthopnea, PND, swelling in lower extremities, anasarca, dizziness, palpitations, syncope Resp: No shortness of breath with exertion or at rest. No cough GI:  No heartburn, indigestion GU: No nocturia  Skin: No rash, lesions, ulcerations MSK:  No joint pain or swelling.   Neuro: No dizziness or lightheadedness.  Psych: No depression or anxiety. Mood stable.     Physical Exam:  BP 123/77 (BP Location: Left Arm, Patient Position: Sitting, Cuff Size: Large)   Pulse 68   Ht 5' 6 (1.676 m)   Wt 233 lb 9.6 oz (106 kg)   SpO2 95%   BMI 37.70 kg/m   GEN: Pleasant, interactive, well-appearing;obese; in no acute distress HEENT:  Normocephalic and atraumatic. PERRLA. Sclera white. Nasal turbinates pink, moist and patent bilaterally. No rhinorrhea present. Oropharynx pink and moist,  without exudate or edema. No lesions, ulcerations, or postnasal drip. Mallampati IV NECK:  Supple w/ fair ROM. No lymphadenopathy.   CV: RRR, no m/r/g, no peripheral edema. Pulses intact, +2 bilaterally. No cyanosis, pallor or clubbing. PULMONARY:  Unlabored, regular breathing. Clear bilaterally A&P w/o wheezes/rales/rhonchi. No accessory muscle use.  GI: BS present and normoactive. Soft, non-tender to palpation. No organomegaly or masses detected.  MSK: No erythema, warmth or tenderness. Cap refil <2 sec all extrem.  Neuro: A/Ox3. No focal deficits noted.   Skin: Warm, no lesions or rashe Psych: Normal affect and behavior. Judgement and thought content appropriate.     Lab Results:  CBC    Component Value Date/Time   WBC 8.3 08/07/2023 0156   RBC 4.65 08/07/2023 0156   HGB 11.7 (L) 08/07/2023 0156   HGB 12.6 03/25/2019 1705   HGB 9.6 (L) 02/14/2013 1552   HCT 37.2 08/07/2023 0156   HCT 40.0 03/25/2019 1705   HCT 30.6 (L) 02/14/2013  1552   PLT 360 08/07/2023 0156   PLT 356 03/25/2019 1705   MCV 80.0 08/07/2023 0156   MCV 77.7 09/06/2019 1535   MCV 76 (L) 03/25/2019 1705   MCV 73.9 (L) 02/14/2013 1552   MCH 25.2 (L) 08/07/2023 0156   MCHC 31.5 08/07/2023 0156   RDW 15.9 (H) 08/07/2023 0156   RDW 15.2 03/25/2019 1705   RDW 17.8 (H) 02/14/2013 1552   LYMPHSABS 2.3 07/07/2022 1508   LYMPHSABS 2.3 02/14/2013 1552   MONOABS 0.5 07/07/2022 1508   MONOABS 0.6 02/14/2013 1552   EOSABS 0.1 07/07/2022 1508   EOSABS 0.1 02/15/2017 1632   BASOSABS 0.1 07/07/2022 1508   BASOSABS 0.1 02/14/2013 1552    BMET    Component Value Date/Time   NA 137 08/07/2023 0156   NA 142 04/28/2023 1541   NA 141 01/16/2013 1500   K 2.8 (L) 08/07/2023 0156   K 4.1 01/16/2013 1500   CL 100 08/07/2023 0156   CO2 25 08/07/2023 0156   CO2 25 01/16/2013 1500   GLUCOSE 121 (H) 08/07/2023 0156   GLUCOSE 90 01/16/2013 1500   BUN 13 08/07/2023 0156   BUN 14 04/28/2023 1541   BUN 11.0 01/16/2013 1500    CREATININE 0.75 08/07/2023 0156   CREATININE 0.8 01/16/2013 1500   CALCIUM  8.9 08/07/2023 0156   CALCIUM  9.1 01/16/2013 1500   GFRNONAA >60 08/07/2023 0156   GFRAA 123 06/29/2020 1637    BNP    Component Value Date/Time   BNP 33.8 10/15/2018 1151     Imaging:  No results found.  Administration History     None           No data to display          No results found for: NITRICOXIDE      Assessment & Plan:   OSA (obstructive sleep apnea) Very severe OSA, on CPAP. Excellent compliance and control. Receives benefit from use. Reviewed that given the severity of her sleep apnea, Inspire would not be an option at this time and oral appliance would not be effective. Encouraged healthy weight loss measures - can reassess severity of OSA once she loses 20% of her weight as this may open up some other options for treatment. Encouraged her to continue utilizing CPAP nightly. She was agreeable to this. Understands risks of untreated OSA. Safe driving practices reviewed. Due for new machine 08/2024.   Patient Instructions  Continue to use CPAP every night, minimum of 4-6 hours a night.  Change equipment as directed. Wash your tubing with warm soap and water daily, hang to dry. Wash humidifier portion weekly. Use bottled, distilled water and change daily Be aware of reduced alertness and do not drive or operate heavy machinery if experiencing this or drowsiness.  Exercise encouraged, as tolerated. Healthy weight management discussed.  Avoid or decrease alcohol consumption and medications that make you more sleepy, if possible. Notify if persistent daytime sleepiness occurs even with consistent use of PAP therapy.  Continue working on weight loss measures with the Mounjaro   Let me know if you get 40-50 lb down and we can repeat a sleep study to reassess your sleep apnea  For now with how severe your sleep apnea is, the best option is to continue CPAP therapy. This is going  to be the most effective.  We discussed how untreated sleep apnea puts an individual at risk for cardiac arrhthymias, pulm HTN, DM, stroke and increases their risk for daytime accidents.  You should be able to get a new CPAP come March 2026  Follow up in March 2026 with Katie Deaysia Grigoryan,NP, or sooner, if needed    Class 2 obesity with body mass index (BMI) of 37.0 to 37.9 in adult BMI 37. Healthy weight loss encouraged   Advised if symptoms do not improve or worsen, to please contact office for sooner follow up or seek emergency care.   I spent 35 minutes of dedicated to the care of this patient on the date of this encounter to include pre-visit review of records, face-to-face time with the patient discussing conditions above, post visit ordering of testing, clinical documentation with the electronic health record, making appropriate referrals as documented, and communicating necessary findings to members of the patients care team.  Comer LULLA Rouleau, NP 12/12/2023  Pt aware and understands NP's role.

## 2023-12-12 NOTE — Assessment & Plan Note (Signed)
 Very severe OSA, on CPAP. Excellent compliance and control. Receives benefit from use. Reviewed that given the severity of her sleep apnea, Inspire would not be an option at this time and oral appliance would not be effective. Encouraged healthy weight loss measures - can reassess severity of OSA once she loses 20% of her weight as this may open up some other options for treatment. Encouraged her to continue utilizing CPAP nightly. She was agreeable to this. Understands risks of untreated OSA. Safe driving practices reviewed. Due for new machine 08/2024.   Patient Instructions  Continue to use CPAP every night, minimum of 4-6 hours a night.  Change equipment as directed. Wash your tubing with warm soap and water daily, hang to dry. Wash humidifier portion weekly. Use bottled, distilled water and change daily Be aware of reduced alertness and do not drive or operate heavy machinery if experiencing this or drowsiness.  Exercise encouraged, as tolerated. Healthy weight management discussed.  Avoid or decrease alcohol consumption and medications that make you more sleepy, if possible. Notify if persistent daytime sleepiness occurs even with consistent use of PAP therapy.  Continue working on weight loss measures with the Mounjaro   Let me know if you get 40-50 lb down and we can repeat a sleep study to reassess your sleep apnea  For now with how severe your sleep apnea is, the best option is to continue CPAP therapy. This is going to be the most effective.  We discussed how untreated sleep apnea puts an individual at risk for cardiac arrhthymias, pulm HTN, DM, stroke and increases their risk for daytime accidents.   You should be able to get a new CPAP come March 2026  Follow up in March 2026 with Katie Domnique Vantine,NP, or sooner, if needed

## 2023-12-12 NOTE — Patient Instructions (Signed)
 Continue to use CPAP every night, minimum of 4-6 hours a night.  Change equipment as directed. Wash your tubing with warm soap and water daily, hang to dry. Wash humidifier portion weekly. Use bottled, distilled water and change daily Be aware of reduced alertness and do not drive or operate heavy machinery if experiencing this or drowsiness.  Exercise encouraged, as tolerated. Healthy weight management discussed.  Avoid or decrease alcohol consumption and medications that make you more sleepy, if possible. Notify if persistent daytime sleepiness occurs even with consistent use of PAP therapy.  Continue working on weight loss measures with the Mounjaro   Let me know if you get 40-50 lb down and we can repeat a sleep study to reassess your sleep apnea  For now with how severe your sleep apnea is, the best option is to continue CPAP therapy. This is going to be the most effective.  We discussed how untreated sleep apnea puts an individual at risk for cardiac arrhthymias, pulm HTN, DM, stroke and increases their risk for daytime accidents.   You should be able to get a new CPAP come March 2026  Follow up in March 2026 with Katie Cleo Villamizar,NP, or sooner, if needed

## 2023-12-14 ENCOUNTER — Ambulatory Visit: Payer: Self-pay | Admitting: Radiology

## 2023-12-14 LAB — CYTOLOGY - PAP
Comment: NEGATIVE
Diagnosis: NEGATIVE
High risk HPV: NEGATIVE

## 2023-12-27 ENCOUNTER — Other Ambulatory Visit: Payer: Self-pay | Admitting: Family

## 2023-12-27 DIAGNOSIS — Z1231 Encounter for screening mammogram for malignant neoplasm of breast: Secondary | ICD-10-CM

## 2023-12-28 ENCOUNTER — Ambulatory Visit: Admitting: Family

## 2023-12-28 ENCOUNTER — Encounter: Payer: Self-pay | Admitting: Family

## 2023-12-28 VITALS — BP 124/84 | HR 74 | Temp 98.6°F | Resp 16 | Ht 66.0 in | Wt 226.0 lb

## 2023-12-28 DIAGNOSIS — E119 Type 2 diabetes mellitus without complications: Secondary | ICD-10-CM

## 2023-12-28 DIAGNOSIS — Z7984 Long term (current) use of oral hypoglycemic drugs: Secondary | ICD-10-CM

## 2023-12-28 DIAGNOSIS — I10 Essential (primary) hypertension: Secondary | ICD-10-CM | POA: Diagnosis not present

## 2023-12-28 DIAGNOSIS — E785 Hyperlipidemia, unspecified: Secondary | ICD-10-CM

## 2023-12-28 DIAGNOSIS — E1165 Type 2 diabetes mellitus with hyperglycemia: Secondary | ICD-10-CM | POA: Diagnosis not present

## 2023-12-28 DIAGNOSIS — Z1231 Encounter for screening mammogram for malignant neoplasm of breast: Secondary | ICD-10-CM

## 2023-12-28 MED ORDER — ATORVASTATIN CALCIUM 20 MG PO TABS: 20.0000 mg | ORAL_TABLET | Freq: Every day | ORAL | 0 refills | Status: DC

## 2023-12-28 MED ORDER — AMLODIPINE BESYLATE 10 MG PO TABS: 10.0000 mg | ORAL_TABLET | Freq: Every day | ORAL | 0 refills | Status: DC

## 2023-12-28 MED ORDER — METFORMIN HCL ER 500 MG PO TB24: 500.0000 mg | ORAL_TABLET | Freq: Every day | ORAL | 0 refills | Status: DC

## 2023-12-28 MED ORDER — HYDROCHLOROTHIAZIDE 50 MG PO TABS: 50.0000 mg | ORAL_TABLET | Freq: Every day | ORAL | 0 refills | Status: DC

## 2023-12-28 NOTE — Progress Notes (Signed)
 Patient ID: Audrey Avery, female    DOB: 1971/01/18  MRN: [id number]  CC: Chronic Conditions Follow-Up  Subjective: Audrey Avery is a 53 y.o. female who presents for chronic conditions follow-up.   Her concerns today include:  - Doing well on Amlodipine  and Hydrochlorothiazide , no issues/concerns. She does not complain of red flag symptoms such as but not limited to chest pain, shortness of breath, worst headache of life, nausea/vomiting.  - Doing well on Metformin  XR, no issues/concerns. States Gynecology prescribed her Tirzepatide  and she has lost 5 pounds. Denies red flag symptoms associated with diabetes. - States she is up to date on diabetic eye exam. - Doing well on Atorvastatin , no issues/concerns.  - States she was scheduled to have a routine mammogram. States when they call her to schedule an appointment she stated her boobs were sore and that she probably wear a sports bra to contain them. States they then told her to get a new mammogram order from [facility].   Patient Active Problem List   Diagnosis Date Noted   Class 2 obesity with body mass index (BMI) of 37.0 to 37.9 in adult 12/12/2023   Hyperlipidemia 05/21/2022   Essential hypertension 10/02/2019   Syncope and collapse 09/30/2019   DOE (dyspnea on exertion) 09/30/2019   OSA (obstructive sleep apnea) 08/02/2019   Anemia 08/15/2012   Menorrhagia 08/15/2012   Refusal of blood transfusions as patient is Jehovah's Witness 08/15/2012     Current Outpatient Medications on File Prior to Visit  Medication Sig Dispense Refill   estradiol  (VIVELLE -DOT) 0.1 MG/24HR patch Place 1 patch (0.1 mg total) onto the skin 2 (two) times a week. 8 patch 12   nystatin  (MYCOSTATIN /NYSTOP ) powder Apply 1 Application topically 3 (three) times daily. 60 g 2   tirzepatide  (MOUNJARO ) 2.5 MG/0.5ML Pen Inject 2.5 mg into the skin once a week. 2 mL 0   nitrofurantoin , macrocrystal-monohydrate, (MACROBID ) 100 MG capsule Take 1  capsule (100 mg total) by mouth 2 (two) times daily. 10 capsule 0   No current facility-administered medications on file prior to visit.    Allergies  Allergen Reactions   Penicillins Anaphylaxis and Swelling    Has patient had a PCN reaction causing immediate rash, facial/tongue/throat swelling, SOB or lightheadedness with hypotension: Yes Has patient had a PCN reaction causing severe rash involving mucus membranes or skin necrosis: No Has patient had a PCN reaction that required hospitalization No Has patient had a PCN reaction occurring within the last 10 years: No If all of the above answers are NO, then may proceed with Cephalosporin use.     Social History   Socioeconomic History   Marital status: Single    Spouse name: Not on file   Number of children: Not on file   Years of education: Not on file   Highest education level: Some college, no degree  Occupational History    Employer: [employer]  Tobacco Use   Smoking status: Never    Passive exposure: Past   Smokeless tobacco: Never  Vaping Use   Vaping status: Never Used  Substance and Sexual Activity   Alcohol use: Yes    Comment: rare   Drug use: No   Sexual activity: Yes    Partners: Male    Birth control/protection: Post-menopausal    Comment: menarche 53yo, sexual debut 53yo  Other Topics Concern   Not on file  Social History Narrative   Not on file   Social Drivers of  Health   Financial Resource Strain: Medium Risk (12/27/2023)   Overall Financial Resource Strain (CARDIA)    Difficulty of Paying Living Expenses: Somewhat hard  Food Insecurity: Food Insecurity Present (12/27/2023)   Hunger Vital Sign    Worried About Running Out of Food in the Last Year: Sometimes true    Ran Out of Food in the Last Year: Often true  Transportation Needs: No Transportation Needs (12/27/2023)   PRAPARE - Administrator, Civil Service (Medical): No    Lack of Transportation (Non-Medical): No   Physical Activity: Sufficiently Active (12/27/2023)   Exercise Vital Sign    Days of Exercise per Week: 3 days    Minutes of Exercise per Session: 50 min  Stress: No Stress Concern Present (12/27/2023)   [employer] of Occupational Health - Occupational Stress Questionnaire    Feeling of Stress: Only a little  Social Connections: Socially Integrated (12/27/2023)   Social Connection and Isolation Panel    Frequency of Communication with Friends and Family: More than three times a week    Frequency of Social Gatherings with Friends and Family: Patient declined    Attends Religious Services: More than 4 times per year    Active Member of [employer] or Organizations: Yes    Attends Engineer, structural: More than 4 times per year    Marital Status: Living with partner  Intimate Partner Violence: Not At Risk (09/29/2023)   Humiliation, Afraid, Rape, and Kick questionnaire    Fear of Current or Ex-Partner: No    Emotionally Abused: No    Physically Abused: No    Sexually Abused: No    Family History  Problem Relation Age of Onset   Cancer Mother    Hypertension Mother    Diabetes Father    Cancer Father    Hypertension Father    Heart attack Father 48   CAD Father 13       She does not know details   Cancer Sister    Hypertension Sister    Colon polyps Sister    Colon polyps Sister    Colon cancer Neg Hx    Esophageal cancer Neg Hx    Rectal cancer Neg Hx    Stomach cancer Neg Hx     Past Surgical History:  Procedure Laterality Date   ABDOMINAL HYSTERECTOMY     BREAST SURGERY     CESAREAN SECTION     CHOLECYSTECTOMY     TRANSTHORACIC ECHOCARDIOGRAM  09/2019   EF 60 to 65%.  Mild LVH,  normal valves..  Normal echo    ROS: Review of Systems Negative except as stated above  PHYSICAL EXAM: BP 124/84   Pulse 74   Temp 98.6 F (37 C) (Oral)   Resp 16   Ht 5' 6 (1.676 m)   Wt 226 lb (102.5 kg)   SpO2 95%   BMI 36.48 kg/m   Physical Exam HENT:     Head:  Normocephalic and atraumatic.     Nose: Nose normal.     Mouth/Throat:     Mouth: Mucous membranes are moist.     Pharynx: Oropharynx is clear.  Eyes:     Extraocular Movements: Extraocular movements intact.     Conjunctiva/sclera: Conjunctivae normal.     Pupils: Pupils are equal, round, and reactive to light.  Cardiovascular:     Rate and Rhythm: Normal rate and regular rhythm.     Pulses: Normal pulses.     Heart  sounds: Normal heart sounds.  Pulmonary:     Effort: Pulmonary effort is normal.     Breath sounds: Normal breath sounds.  Musculoskeletal:        General: Normal range of motion.     Cervical back: Normal range of motion and neck supple.  Neurological:     General: No focal deficit present.     Mental Status: She is alert and oriented to person, place, and time.  Psychiatric:        Mood and Affect: Mood normal.        Behavior: Behavior normal.      ASSESSMENT AND PLAN: 1. Primary hypertension (Primary) - Continue Amlodipine  and Hydrochlorothiazide  as prescribed.  - Routine screening.  - Counseled on blood pressure goal of less than 130/80, low-sodium, DASH diet, medication compliance, and 150 minutes of moderate intensity exercise per week as tolerated. Counseled on medication adherence and adverse effects. - Follow-up with primary provider in 3 months or sooner if needed. - Basic Metabolic Panel - amLODipine  (NORVASC ) 10 MG tablet; Take 1 tablet (10 mg total) by mouth daily.  Dispense: 90 tablet; Refill: 0 - hydrochlorothiazide  (HYDRODIURIL ) 50 MG tablet; Take 1 tablet (50 mg total) by mouth daily.  Dispense: 90 tablet; Refill: 0  2. Type 2 diabetes mellitus with hyperglycemia, without long-term current use of insulin (HCC) - Continue Metformin  XR as prescribed.  - Continue Tirzepatide  as prescribed (managed by Gynecology). - Hemoglobin A1c result pending.  - Discussed the importance of healthy eating habits, low-carbohydrate diet, low-sugar diet, regular  aerobic exercise (at least 150 minutes a week as tolerated) and medication compliance to achieve or maintain control of diabetes. Counseled on medication adherence/adverse effects.  - Follow-up with primary provider as scheduled. - Hemoglobin A1c - metFORMIN  (GLUCOPHAGE -XR) 500 MG 24 hr tablet; Take 1 tablet (500 mg total) by mouth daily with breakfast.  Dispense: 90 tablet; Refill: 0  3. Diabetic eye exam (HCC) - Patient declined.  4. Hyperlipidemia, unspecified hyperlipidemia type - Continue Atorvastatin  as prescribed. Counseled on medication adherence/adverse effects.  - Routine screening.  - Follow-up with primary provider as scheduled. - Lipid panel - atorvastatin  (LIPITOR) 20 MG tablet; Take 1 tablet (20 mg total) by mouth daily.  Dispense: 90 tablet; Refill: 0  5. Encounter for screening mammogram for malignant neoplasm of breast - Routine screening.  - MM Digital Screening; Future - MM Digital Diagnostic Bilat; Future  Patient was given the opportunity to ask questions.  Patient verbalized understanding of the plan and was able to repeat key elements of the plan. Patient was given clear instructions to go to Emergency Department or return to medical center if symptoms don't improve, worsen, or new problems develop.The patient verbalized understanding.   Orders Placed This Encounter  Procedures   MM Digital Screening   MM Digital Diagnostic Bilat   Lipid panel   Hemoglobin A1c   Basic Metabolic Panel     Requested Prescriptions   Signed Prescriptions Disp Refills   amLODipine  (NORVASC ) 10 MG tablet 90 tablet 0    Sig: Take 1 tablet (10 mg total) by mouth daily.   atorvastatin  (LIPITOR) 20 MG tablet 90 tablet 0    Sig: Take 1 tablet (20 mg total) by mouth daily.   hydrochlorothiazide  (HYDRODIURIL ) 50 MG tablet 90 tablet 0    Sig: Take 1 tablet (50 mg total) by mouth daily.   metFORMIN  (GLUCOPHAGE -XR) 500 MG 24 hr tablet 90 tablet 0    Sig: Take 1 tablet (  500 mg total)  by mouth daily with breakfast.    Return in about 3 months (around 03/29/2024) for Follow-Up or next available chronic conditions.  Audrey JINNY Drones, NP

## 2023-12-28 NOTE — Progress Notes (Signed)
 3 month follow-up

## 2023-12-29 ENCOUNTER — Ambulatory Visit: Payer: Self-pay | Admitting: Family

## 2023-12-29 LAB — BASIC METABOLIC PANEL WITH GFR
BUN/Creatinine Ratio: 17 (ref 9–23)
BUN: 12 mg/dL (ref 6–24)
CO2: 24 mmol/L (ref 20–29)
Calcium: 9.7 mg/dL (ref 8.7–10.2)
Chloride: 98 mmol/L (ref 96–106)
Creatinine, Ser: 0.72 mg/dL (ref 0.57–1.00)
Glucose: 92 mg/dL (ref 70–99)
Potassium: 3.3 mmol/L — ABNORMAL LOW (ref 3.5–5.2)
Sodium: 138 mmol/L (ref 134–144)
eGFR: 102 mL/min/1.73 (ref >59)

## 2023-12-29 LAB — LIPID PANEL
Chol/HDL Ratio: 2.7 ratio (ref 0.0–4.4)
Cholesterol, Total: 112 mg/dL (ref 100–199)
HDL: 41 mg/dL (ref >39)
LDL Chol Calc (NIH): 53 mg/dL (ref 0–99)
Triglycerides: 95 mg/dL (ref 0–149)
VLDL Cholesterol Cal: 18 mg/dL (ref 5–40)

## 2023-12-29 LAB — HEMOGLOBIN A1C
Est. average glucose Bld gHb Est-mCnc: 137 mg/dL
Hgb A1c MFr Bld: 6.4 % — ABNORMAL HIGH (ref 4.8–5.6)

## 2024-01-01 ENCOUNTER — Ambulatory Visit: Admitting: Family

## 2024-01-05 ENCOUNTER — Encounter: Payer: Self-pay | Admitting: Radiology

## 2024-01-05 ENCOUNTER — Ambulatory Visit: Admitting: Radiology

## 2024-01-05 VITALS — BP 118/68 | HR 74 | Ht 66.93 in | Wt 227.0 lb

## 2024-01-05 DIAGNOSIS — Z7985 Long-term (current) use of injectable non-insulin antidiabetic drugs: Secondary | ICD-10-CM

## 2024-01-05 DIAGNOSIS — E119 Type 2 diabetes mellitus without complications: Secondary | ICD-10-CM | POA: Diagnosis not present

## 2024-01-05 DIAGNOSIS — G4733 Obstructive sleep apnea (adult) (pediatric): Secondary | ICD-10-CM | POA: Diagnosis not present

## 2024-01-05 MED ORDER — MOUNJARO 5 MG/0.5ML ~~LOC~~ SOAJ: 5.0000 mg | SUBCUTANEOUS | 0 refills | Status: DC

## 2024-01-05 MED ORDER — MOUNJARO 7.5 MG/0.5ML ~~LOC~~ SOAJ: 7.5000 mg | SUBCUTANEOUS | 1 refills | Status: DC

## 2024-01-05 NOTE — Progress Notes (Signed)
   Audrey Avery 1971/02/10 [id number]   History:  53 y.o. G4P2 presents for medication management. Was started on Mounjaro  2.5mg  at her AEX for DM. Doing well, no side effects. Has lost 6lbs. Continues to eat well and exercise. No episodes of hyper or hypoglycemia.  Gynecologic History No LMP recorded. Patient has had a hysterectomy.   Obstetric History OB History  Gravida Para Term Preterm AB Living  4 2    2   SAB IAB Ectopic Multiple Live Births      2    # Outcome Date GA Lbr Len/2nd Weight Sex Type Anes PTL Lv  4 Gravida           3 Gravida           2 Para           1 Para                12/28/2023   11:17 AM 12/08/2023   11:44 AM 09/29/2023    1:34 PM  Depression screen PHQ 2/9  Decreased Interest 0 0 0  Down, Depressed, Hopeless 0 0 0  PHQ - 2 Score 0 0 0     The following portions of the patient's history were reviewed and updated as appropriate: allergies, current medications, past family history, past medical history, past social history, past surgical history, and problem list.  Review of Systems  All other systems reviewed and are negative.   Past medical history, past surgical history, family history and social history were all reviewed and documented in the EPIC chart.  Exam:  Vitals:   01/05/24 0832  BP: 118/68  Pulse: 74  SpO2: 98%  Weight: 227 lb (103 kg)  Height: 5' 6.93 (1.7 m)   Body mass index is 35.63 kg/m.  Physical Exam Constitutional:      Appearance: Normal appearance. She is obese.  Pulmonary:     Effort: Pulmonary effort is normal.  Neurological:     Mental Status: She is alert.  Psychiatric:        Mood and Affect: Mood normal.        Thought Content: Thought content normal.        Judgment: Judgment normal.      Darice Hoit, CMA present for exam  Starting weight: Current weight: First goal: Overall weight loss goal:  Assessment/Plan:   1. Diabetes mellitus type 2, noninsulin dependent (HCC) (Primary) -  tirzepatide  (MOUNJARO ) 5 MG/0.5ML Pen; Inject 5 mg into the skin once a week.  Dispense: 2 mL; Refill: 0 - tirzepatide  (MOUNJARO ) 7.5 MG/0.5ML Pen; Inject 7.5 mg into the skin once a week.  Dispense: 2 mL; Refill: 1  2. Obstructive sleep apnea syndrome - tirzepatide  (MOUNJARO ) 5 MG/0.5ML Pen; Inject 5 mg into the skin once a week.  Dispense: 2 mL; Refill: 0 - tirzepatide  (MOUNJARO ) 7.5 MG/0.5ML Pen; Inject 7.5 mg into the skin once a week.  Dispense: 2 mL; Refill: 1    Risks and benefits discussed. Continue to focus on protein and fiber. Small frequent meals. Adequate hydration and electrolyte replacement. Will continue to exercise for at least a week including weight bearing exercise.   Return in about 8 weeks (around 03/01/2024) for Med Follow-up.  GINETTE COZIER B WHNP-BC 8:57 AM 01/05/2024

## 2024-01-29 ENCOUNTER — Other Ambulatory Visit: Payer: Self-pay | Admitting: Family

## 2024-01-29 DIAGNOSIS — B3789 Other sites of candidiasis: Secondary | ICD-10-CM

## 2024-01-29 NOTE — Telephone Encounter (Unsigned)
 Copied from CRM [id number]. Topic: Clinical - Medication Refill >> Jan 29, 2024 12:19 PM Yolanda T wrote: Medication: nystatin  (MYCOSTATIN /NYSTOP ) powder  Has the patient contacted their pharmacy? Yes  This is the patient's preferred pharmacy:  [id number] [place], [place] - [address] AT [facility] OF [facility] & [address] [place] [address] Phone: [phone] Fax: [phone]  Is this the correct pharmacy for this prescription? Yes If no, delete pharmacy and type the correct one.   Has the prescription been filled recently? Yes  Is the patient out of the medication? Yes  Has the patient been seen for an appointment in the last year OR does the patient have an upcoming appointment? Yes  Can we respond through MyChart? Yes  Agent: Please be advised that Rx refills may take up to 3 business days. We ask that you follow-up with your pharmacy.

## 2024-02-01 NOTE — Telephone Encounter (Signed)
 Requested medication (s) are due for refill today: yes  Requested medication (s) are on the active medication list: yes  Last refill:  12/01/22 60 g 2 RF  Future visit scheduled: yes  Notes to clinic:  med not assigned to a protocol   Requested Prescriptions  Pending Prescriptions Disp Refills   nystatin  (MYCOSTATIN /NYSTOP ) powder 60 g 2    Sig: Apply 1 Application topically 3 (three) times daily.     Off-Protocol Failed - 02/01/2024 12:32 PM      Failed - Medication not assigned to a protocol, review manually.      Passed - Valid encounter within last 12 months    Recent Outpatient Visits           1 month ago Primary hypertension   [facility], [place], [place]   4 months ago Primary hypertension   [facility], [place], [place]   7 months ago Primary hypertension   [place] [facility], [place], [place]   9 months ago Primary hypertension   [facility] [place] [facility], [place], [place]   1 year ago Primary hypertension   [place] [facility], [place] PARAS, NP       Future Appointments             In 4 weeks Chrzanowski, Jami B, NP [facility] [place]

## 2024-02-02 ENCOUNTER — Ambulatory Visit
Admission: RE | Admit: 2024-02-02 | Discharge: 2024-02-02 | Disposition: A | Discharge status: Home / Self Care | Source: Ambulatory Visit | Attending: Family | Admitting: Family

## 2024-02-02 DIAGNOSIS — Z1231 Encounter for screening mammogram for malignant neoplasm of breast: Secondary | ICD-10-CM

## 2024-02-06 MED ORDER — NYSTATIN 100000 UNIT/GM EX POWD: 1.0000 | Freq: Three times a day (TID) | CUTANEOUS | 4 refills | Status: AC

## 2024-02-06 NOTE — Telephone Encounter (Signed)
 Complete

## 2024-02-28 ENCOUNTER — Other Ambulatory Visit: Payer: Self-pay | Admitting: Radiology

## 2024-02-28 DIAGNOSIS — E119 Type 2 diabetes mellitus without complications: Secondary | ICD-10-CM

## 2024-02-28 NOTE — Telephone Encounter (Signed)
 Med refill request: Mounjaro  7.5 mg Last OV 01/05/24 Last AEX:  12/08/23 Next OV: 03/01/24 Last MMG (if hormonal med) n/a Refill authorized: Please Advise?

## 2024-03-01 ENCOUNTER — Ambulatory Visit: Admitting: Radiology

## 2024-03-01 ENCOUNTER — Encounter: Payer: Self-pay | Admitting: Radiology

## 2024-03-01 VITALS — BP 102/68 | HR 79 | Wt 215.0 lb

## 2024-03-01 DIAGNOSIS — E119 Type 2 diabetes mellitus without complications: Secondary | ICD-10-CM | POA: Diagnosis not present

## 2024-03-01 DIAGNOSIS — G4733 Obstructive sleep apnea (adult) (pediatric): Secondary | ICD-10-CM | POA: Diagnosis not present

## 2024-03-01 DIAGNOSIS — Z7985 Long-term (current) use of injectable non-insulin antidiabetic drugs: Secondary | ICD-10-CM

## 2024-03-01 MED ORDER — MOUNJARO 10 MG/0.5ML ~~LOC~~ SOAJ: 10.0000 mg | SUBCUTANEOUS | 0 refills | Status: DC

## 2024-03-01 NOTE — Progress Notes (Signed)
   JOANNAH GITLIN 1971/05/28 [id number]   History:  53 y.o. G4P2 presents for medication management. Was started on Mounjaro  for DM. Doing well, no side effects. Has lost 6lbs. Continues to eat well and exercise. No episodes of hyper or hypoglycemia.  Gynecologic History No LMP recorded. Patient has had a hysterectomy.   Obstetric History OB History  Gravida Para Term Preterm AB Living  4 2    2   SAB IAB Ectopic Multiple Live Births      2    # Outcome Date GA Lbr Len/2nd Weight Sex Type Anes PTL Lv  4 Gravida           3 Gravida           2 Para           1 Para                12/28/2023   11:17 AM 12/08/2023   11:44 AM 09/29/2023    1:34 PM  Depression screen PHQ 2/9  Decreased Interest 0 0 0  Down, Depressed, Hopeless 0 0 0  PHQ - 2 Score 0 0 0     The following portions of the patient's history were reviewed and updated as appropriate: allergies, current medications, past family history, past medical history, past social history, past surgical history, and problem list.  Review of Systems  All other systems reviewed and are negative.   Past medical history, past surgical history, family history and social history were all reviewed and documented in the EPIC chart.  Exam:  Vitals:   03/01/24 0835  BP: 102/68  Pulse: 79  SpO2: 99%  Weight: 215 lb (97.5 kg)   Body mass index is 33.75 kg/m.  Physical Exam Constitutional:      Appearance: Normal appearance. She is obese.  Pulmonary:     Effort: Pulmonary effort is normal.  Neurological:     Mental Status: She is alert.  Psychiatric:        Mood and Affect: Mood normal.        Thought Content: Thought content normal.        Judgment: Judgment normal.      Darice Hoit, CMA present for exam  Starting weight:231 Current weight:215 First goal:200 Overall weight loss goal:170  Assessment/Plan:   1. Diabetes mellitus type 2, noninsulin dependent (HCC) (Primary) - tirzepatide  (MOUNJARO ) 10 MG/0.5ML  Pen; Inject 10 mg into the skin once a week.  Dispense: 2 mL; Refill: 0  2. Obstructive sleep apnea syndrome Will contact me after 4 weeks to let me know how she is doing on 10mg  and if she is ready to increase to 12.5mg   Risks and benefits discussed. Continue to focus on protein and fiber. Small frequent meals. Adequate hydration and electrolyte replacement. Will continue to exercise for at least a week including weight bearing exercise.   Return in about 8 weeks (around 04/26/2024) for Med Follow-up.  GINETTE COZIER B WHNP-BC 9:15 AM 03/01/2024

## 2024-03-20 ENCOUNTER — Ambulatory Visit: Admitting: Family

## 2024-03-20 ENCOUNTER — Encounter: Payer: Self-pay | Admitting: Family

## 2024-03-20 VITALS — BP 121/77 | HR 77 | Temp 98.1°F | Resp 16 | Ht 66.93 in | Wt 211.6 lb

## 2024-03-20 DIAGNOSIS — E119 Type 2 diabetes mellitus without complications: Secondary | ICD-10-CM

## 2024-03-20 DIAGNOSIS — E785 Hyperlipidemia, unspecified: Secondary | ICD-10-CM

## 2024-03-20 DIAGNOSIS — Z7984 Long term (current) use of oral hypoglycemic drugs: Secondary | ICD-10-CM

## 2024-03-20 DIAGNOSIS — Z7722 Contact with and (suspected) exposure to environmental tobacco smoke (acute) (chronic): Secondary | ICD-10-CM

## 2024-03-20 DIAGNOSIS — E1165 Type 2 diabetes mellitus with hyperglycemia: Secondary | ICD-10-CM | POA: Diagnosis not present

## 2024-03-20 DIAGNOSIS — Z79899 Other long term (current) drug therapy: Secondary | ICD-10-CM

## 2024-03-20 DIAGNOSIS — G629 Polyneuropathy, unspecified: Secondary | ICD-10-CM | POA: Diagnosis not present

## 2024-03-20 DIAGNOSIS — I10 Essential (primary) hypertension: Secondary | ICD-10-CM | POA: Diagnosis not present

## 2024-03-20 MED ORDER — GABAPENTIN 300 MG PO CAPS: 300.0000 mg | ORAL_CAPSULE | Freq: Every evening | ORAL | 0 refills | Status: DC | PRN

## 2024-03-20 MED ORDER — METFORMIN HCL ER 500 MG PO TB24: 500.0000 mg | ORAL_TABLET | Freq: Every day | ORAL | 0 refills | Status: DC

## 2024-03-20 MED ORDER — HYDROCHLOROTHIAZIDE 50 MG PO TABS: 50.0000 mg | ORAL_TABLET | Freq: Every day | ORAL | 0 refills | Status: DC

## 2024-03-20 MED ORDER — AMLODIPINE BESYLATE 10 MG PO TABS: 10.0000 mg | ORAL_TABLET | Freq: Every day | ORAL | 0 refills | Status: DC

## 2024-03-20 MED ORDER — ATORVASTATIN CALCIUM 20 MG PO TABS: 20.0000 mg | ORAL_TABLET | Freq: Every day | ORAL | 0 refills | Status: DC

## 2024-03-20 NOTE — Progress Notes (Signed)
 Patient ID: AMI MALLY, female    DOB: [birth year]  MRN: [id number]  CC: Chronic Conditions Follow-Up  Subjective: Audrey Avery is a 53 y.o. female who presents for chronic conditions follow-up.   Her concerns today include:  - Doing well on Amlodipine  and Hydrochlorothiazide , no issues/concerns. She does not complain of red flag symptoms such as but not limited to chest pain, shortness of breath, worst headache of life, nausea/vomiting.  - Doing well on Metformin  XR and Tirzepatide , no issues/concerns. Denies red flag symptoms associated with diabetes.  - Due for diabetic eye exam.  - Doing well on Atorvastatin , no issues/concerns.  - Intermittent numbness of bilateral hands and feet. Denies red flag symptoms.   Patient Active Problem List   Diagnosis Date Noted   Class 2 obesity with body mass index (BMI) of 37.0 to 37.9 in adult 12/12/2023   Hyperlipidemia 05/21/2022   Essential hypertension 10/02/2019   Syncope and collapse 09/30/2019   DOE (dyspnea on exertion) 09/30/2019   OSA (obstructive sleep apnea) 08/02/2019   Anemia 08/15/2012   Menorrhagia 08/15/2012   Refusal of blood transfusions as patient is Jehovah's Witness 08/15/2012     Current Outpatient Medications on File Prior to Visit  Medication Sig Dispense Refill   estradiol  (VIVELLE -DOT) 0.1 MG/24HR patch Place 1 patch (0.1 mg total) onto the skin 2 (two) times a week. 8 patch 12   nystatin  (MYCOSTATIN /NYSTOP ) powder Apply 1 Application topically 3 (three) times daily. 60 g 4   tirzepatide  (MOUNJARO ) 10 MG/0.5ML Pen Inject 10 mg into the skin once a week. 2 mL 0   tirzepatide  (MOUNJARO ) 10 MG/0.5ML Pen Inject 10 mg into the skin once a week.     No current facility-administered medications on file prior to visit.    Allergies  Allergen Reactions   Penicillins Anaphylaxis and Swelling    Has patient had a PCN reaction causing immediate rash, facial/tongue/throat swelling, SOB or lightheadedness with  hypotension: Yes Has patient had a PCN reaction causing severe rash involving mucus membranes or skin necrosis: No Has patient had a PCN reaction that required hospitalization No Has patient had a PCN reaction occurring within the last 10 years: No If all of the above answers are NO, then may proceed with Cephalosporin use.     Social History   Socioeconomic History   Marital status: Single    Spouse name: Not on file   Number of children: Not on file   Years of education: Not on file   Highest education level: Some college, no degree  Occupational History    Employer: [employer]  Tobacco Use   Smoking status: Never    Passive exposure: Past   Smokeless tobacco: Never  Vaping Use   Vaping status: Never Used  Substance and Sexual Activity   Alcohol use: Yes    Comment: rare   Drug use: No   Sexual activity: Yes    Partners: Male    Birth control/protection: Post-menopausal    Comment: menarche 53yo, sexual debut 53yo  Other Topics Concern   Not on file  Social History Narrative   Not on file   Social Drivers of Health   Financial Resource Strain: Medium Risk (12/27/2023)   Overall Financial Resource Strain (CARDIA)    Difficulty of Paying Living Expenses: Somewhat hard  Food Insecurity: Food Insecurity Present (12/27/2023)   Hunger Vital Sign    Worried About Running Out of Food in the Last Year: Sometimes true  Ran Out of Food in the Last Year: Often true  Transportation Needs: No Transportation Needs (12/27/2023)   PRAPARE - Administrator, Civil Service (Medical): No    Lack of Transportation (Non-Medical): No  Physical Activity: Sufficiently Active (12/27/2023)   Exercise Vital Sign    Days of Exercise per Week: 3 days    Minutes of Exercise per Session: 50 min  Stress: No Stress Concern Present (12/27/2023)   [employer] of Occupational Health - Occupational Stress Questionnaire    Feeling of Stress: Only a little  Social  Connections: Socially Integrated (12/27/2023)   Social Connection and Isolation Panel    Frequency of Communication with Friends and Family: More than three times a week    Frequency of Social Gatherings with Friends and Family: Patient declined    Attends Religious Services: More than 4 times per year    Active Member of [employer] or Organizations: Yes    Attends Engineer, structural: More than 4 times per year    Marital Status: Living with partner  Intimate Partner Violence: Not At Risk (09/29/2023)   Humiliation, Afraid, Rape, and Kick questionnaire    Fear of Current or Ex-Partner: No    Emotionally Abused: No    Physically Abused: No    Sexually Abused: No    Family History  Problem Relation Age of Onset   Cancer Mother    Hypertension Mother    Diabetes Father    Cancer Father    Hypertension Father    Heart attack Father 37   CAD Father 36       She does not know details   Cancer Sister    Hypertension Sister    Colon polyps Sister    Colon polyps Sister    Colon cancer Neg Hx    Esophageal cancer Neg Hx    Rectal cancer Neg Hx    Stomach cancer Neg Hx     Past Surgical History:  Procedure Laterality Date   ABDOMINAL HYSTERECTOMY     BREAST SURGERY     CESAREAN SECTION     CHOLECYSTECTOMY     TRANSTHORACIC ECHOCARDIOGRAM  09/2019   EF 60 to 65%.  Mild LVH,  normal valves..  Normal echo    ROS: Review of Systems Negative except as stated above  PHYSICAL EXAM: BP 121/77   Pulse 77   Temp 98.1 F (36.7 C) (Oral)   Resp 16   Ht 5' 6.93 (1.7 m)   Wt 211 lb 9.6 oz (96 kg)   SpO2 96%   BMI 33.21 kg/m   Physical Exam HENT:     Head: Normocephalic and atraumatic.     Nose: Nose normal.     Mouth/Throat:     Mouth: Mucous membranes are moist.     Pharynx: Oropharynx is clear.  Eyes:     Extraocular Movements: Extraocular movements intact.     Conjunctiva/sclera: Conjunctivae normal.     Pupils: Pupils are equal, round, and reactive to light.   Cardiovascular:     Rate and Rhythm: Normal rate and regular rhythm.     Pulses: Normal pulses.     Heart sounds: Normal heart sounds.  Pulmonary:     Effort: Pulmonary effort is normal.     Breath sounds: Normal breath sounds.  Musculoskeletal:        General: Normal range of motion.     Right shoulder: Normal.     Left shoulder: Normal.  Right upper arm: Normal.     Left upper arm: Normal.     Right elbow: Normal.     Left elbow: Normal.     Right forearm: Normal.     Left forearm: Normal.     Right wrist: Normal.     Left wrist: Normal.     Right hand: Normal.     Left hand: Normal.     Cervical back: Normal range of motion and neck supple.     Right hip: Normal.     Left hip: Normal.     Right upper leg: Normal.     Left upper leg: Normal.     Right knee: Normal.     Left knee: Normal.     Right lower leg: Normal.     Left lower leg: Normal.     Right ankle: Normal.     Left ankle: Normal.     Right foot: Normal.     Left foot: Normal.  Neurological:     General: No focal deficit present.     Mental Status: She is alert and oriented to person, place, and time.  Psychiatric:        Mood and Affect: Mood normal.        Behavior: Behavior normal.    ASSESSMENT AND PLAN: 1. Primary hypertension (Primary) - Continue Amlodipine  and Hydrochlorothiazide  as prescribed.  - Routine screening.  - Counseled on blood pressure goal of less than 130/80, low-sodium, DASH diet, medication compliance, and 150 minutes of moderate intensity exercise per week as tolerated. Counseled on medication adherence and adverse effects. - Follow-up with primary provider in 3 months or sooner if needed. - amLODipine  (NORVASC ) 10 MG tablet; Take 1 tablet (10 mg total) by mouth daily.  Dispense: 90 tablet; Refill: 0 - hydrochlorothiazide  (HYDRODIURIL ) 50 MG tablet; Take 1 tablet (50 mg total) by mouth daily.  Dispense: 90 tablet; Refill: 0 - Basic Metabolic Panel  2. Type 2 diabetes  mellitus with hyperglycemia, without long-term current use of insulin (HCC) - Hemoglobin A1c result pending.  - Continue Metformin  XR as prescribed.  - Continue Tirzepatide  as prescribed (managed by Gynecology).  - Discussed the importance of healthy eating habits, low-carbohydrate diet, low-sugar diet, regular aerobic exercise (at least 150 minutes a week as tolerated) and medication compliance to achieve or maintain control of diabetes. Counseled on medication adherence/adverse effects.  - Follow-up with primary provider as scheduled. - Hemoglobin A1c - metFORMIN  (GLUCOPHAGE -XR) 500 MG 24 hr tablet; Take 1 tablet (500 mg total) by mouth daily with breakfast.  Dispense: 90 tablet; Refill: 0  3. Diabetic eye exam [facility]) - Referral to Ophthalmology for evaluation/management. - Ambulatory referral to Ophthalmology  4. Neuropathy - Gabapentin as prescribed. Counseled on medication adherence/adverse effects.  - Follow-up with primary provider as scheduled.  - gabapentin (NEURONTIN) 300 MG capsule; Take 1 capsule (300 mg total) by mouth at bedtime as needed.  Dispense: 90 capsule; Refill: 0  5. Hyperlipidemia, unspecified hyperlipidemia type - Continue Atorvastatin  as prescribed. Counseled on medication adherence/adverse effects.  - Follow-up with primary provider in 3 months or sooner if needed.   - atorvastatin  (LIPITOR) 20 MG tablet; Take 1 tablet (20 mg total) by mouth daily.  Dispense: 90 tablet; Refill: 0   Patient was given the opportunity to ask questions.  Patient verbalized understanding of the plan and was able to repeat key elements of the plan. Patient was given clear instructions to go to Emergency Department or return to medical  center if symptoms don't improve, worsen, or new problems develop.The patient verbalized understanding.   Orders Placed This Encounter  Procedures   Hemoglobin A1c   Basic Metabolic Panel   Ambulatory referral to Ophthalmology     Requested  Prescriptions   Signed Prescriptions Disp Refills   gabapentin (NEURONTIN) 300 MG capsule 90 capsule 0    Sig: Take 1 capsule (300 mg total) by mouth at bedtime as needed.   amLODipine  (NORVASC ) 10 MG tablet 90 tablet 0    Sig: Take 1 tablet (10 mg total) by mouth daily.   hydrochlorothiazide  (HYDRODIURIL ) 50 MG tablet 90 tablet 0    Sig: Take 1 tablet (50 mg total) by mouth daily.   atorvastatin  (LIPITOR) 20 MG tablet 90 tablet 0    Sig: Take 1 tablet (20 mg total) by mouth daily.   metFORMIN  (GLUCOPHAGE -XR) 500 MG 24 hr tablet 90 tablet 0    Sig: Take 1 tablet (500 mg total) by mouth daily with breakfast.    Return in about 3 months (around 06/20/2024) for Follow-Up or next available chronic conditions.  Greig JINNY Drones, NP

## 2024-03-20 NOTE — Progress Notes (Signed)
 3 month follow up, medication refill

## 2024-03-21 ENCOUNTER — Ambulatory Visit: Payer: Self-pay | Admitting: Family Medicine

## 2024-03-21 LAB — BASIC METABOLIC PANEL WITH GFR
BUN/Creatinine Ratio: 20 (ref 9–23)
BUN: 12 mg/dL (ref 6–24)
CO2: 25 mmol/L (ref 20–29)
Calcium: 9.3 mg/dL (ref 8.7–10.2)
Chloride: 98 mmol/L (ref 96–106)
Creatinine, Ser: 0.61 mg/dL (ref 0.57–1.00)
Glucose: 81 mg/dL (ref 70–99)
Potassium: 3.1 mmol/L — ABNORMAL LOW (ref 3.5–5.2)
Sodium: 138 mmol/L (ref 134–144)
eGFR: 109 mL/min/1.73 (ref >59)

## 2024-03-21 LAB — HEMOGLOBIN A1C
Est. average glucose Bld gHb Est-mCnc: 126 mg/dL
Hgb A1c MFr Bld: 6 % — ABNORMAL HIGH (ref 4.8–5.6)

## 2024-03-26 ENCOUNTER — Other Ambulatory Visit: Payer: Self-pay | Admitting: Radiology

## 2024-03-26 DIAGNOSIS — G4733 Obstructive sleep apnea (adult) (pediatric): Secondary | ICD-10-CM

## 2024-03-26 DIAGNOSIS — E119 Type 2 diabetes mellitus without complications: Secondary | ICD-10-CM

## 2024-03-26 NOTE — Telephone Encounter (Signed)
.  Med refill request: Mounjaro  Last OV: 03/01/24 Next OV: 05/03/24 Last MMG (if hormonal med) 02/02/24 Refill authorized: Please Advise?

## 2024-04-20 ENCOUNTER — Other Ambulatory Visit: Payer: Self-pay | Admitting: Radiology

## 2024-04-20 DIAGNOSIS — G4733 Obstructive sleep apnea (adult) (pediatric): Secondary | ICD-10-CM

## 2024-04-20 DIAGNOSIS — E119 Type 2 diabetes mellitus without complications: Secondary | ICD-10-CM

## 2024-04-22 NOTE — Telephone Encounter (Signed)
 Med refill request:   MOUNJARO  10 MG/0.5ML Pen  Start:  03/26/24 Disp:   2 mL Refills:  0  Last AEX:  12/08/23 Next AEX:  05/03/24 (med Check) Last MMG (if hormonal med):  N/A Refill authorized? Please Advise.

## 2024-05-03 ENCOUNTER — Ambulatory Visit: Admitting: Radiology

## 2024-05-03 ENCOUNTER — Encounter: Payer: Self-pay | Admitting: Radiology

## 2024-05-03 VITALS — BP 114/66 | Wt 213.0 lb

## 2024-05-03 DIAGNOSIS — E119 Type 2 diabetes mellitus without complications: Secondary | ICD-10-CM

## 2024-05-03 DIAGNOSIS — Z7985 Long-term (current) use of injectable non-insulin antidiabetic drugs: Secondary | ICD-10-CM | POA: Diagnosis not present

## 2024-05-03 DIAGNOSIS — G4733 Obstructive sleep apnea (adult) (pediatric): Secondary | ICD-10-CM

## 2024-05-03 MED ORDER — MOUNJARO 12.5 MG/0.5ML ~~LOC~~ SOAJ: 12.5000 mg | SUBCUTANEOUS | 1 refills | Status: DC

## 2024-05-03 NOTE — Progress Notes (Signed)
   Audrey Avery 1970-12-13 [id number]   History:  53 y.o. G4P2 presents for medication management. Was started on Mounjaro  for DM. Doing well, no side effects. Has lost 19lbs. Continues to eat well and exercise. No episodes of hyper or hypoglycemia.  Gynecologic History No LMP recorded. Patient has had a hysterectomy.   Obstetric History OB History  Gravida Para Term Preterm AB Living  4 2    2   SAB IAB Ectopic Multiple Live Births      2    # Outcome Date GA Lbr Len/2nd Weight Sex Type Anes PTL Lv  4 Gravida           3 Gravida           2 Para           1 Para                03/20/2024   10:33 AM 12/28/2023   11:17 AM 12/08/2023   11:44 AM  Depression screen PHQ 2/9  Decreased Interest 0 0 0  Down, Depressed, Hopeless 0 0 0  PHQ - 2 Score 0 0 0     The following portions of the patient's history were reviewed and updated as appropriate: allergies, current medications, past family history, past medical history, past social history, past surgical history, and problem list.  Review of Systems  All other systems reviewed and are negative.   Past medical history, past surgical history, family history and social history were all reviewed and documented in the EPIC chart.  Exam:  Vitals:   05/03/24 1104  BP: 114/66  Weight: 213 lb (96.6 kg)   Body mass index is 33.43 kg/m.  Physical Exam Constitutional:      Appearance: Normal appearance. She is obese.  Pulmonary:     Effort: Pulmonary effort is normal.  Neurological:     Mental Status: She is alert.  Psychiatric:        Mood and Affect: Mood normal.        Thought Content: Thought content normal.        Judgment: Judgment normal.      Audrey Avery, CMA present for exam  Starting weight:231 Current weight:213 First goal:200 Overall weight loss goal:170  Assessment/Plan:   1. Diabetes mellitus type 2, noninsulin dependent (HCC) (Primary) - tirzepatide  (MOUNJARO ) 12.5 MG/0.5ML Pen; Inject 12.5 mg  into the skin once a week.  Dispense: 2 mL; Refill: 1  2. Obstructive sleep apnea syndrome - tirzepatide  (MOUNJARO ) 12.5 MG/0.5ML Pen; Inject 12.5 mg into the skin once a week.  Dispense: 2 mL; Refill: 1   Risks and benefits discussed. Continue to focus on protein and fiber. Small frequent meals. Adequate hydration and electrolyte replacement. Will continue to exercise for at least a week including weight bearing exercise.   Return in about 8 weeks (around 06/28/2024) for Med Follow-up.  Audrey Avery B WHNP-BC 11:11 AM 05/03/2024

## 2024-05-11 ENCOUNTER — Encounter

## 2024-05-12 ENCOUNTER — Other Ambulatory Visit: Payer: Self-pay | Admitting: Family

## 2024-05-12 DIAGNOSIS — I10 Essential (primary) hypertension: Secondary | ICD-10-CM

## 2024-05-13 NOTE — Telephone Encounter (Signed)
 Complete

## 2024-06-18 ENCOUNTER — Other Ambulatory Visit: Payer: Self-pay | Admitting: Family

## 2024-06-18 DIAGNOSIS — G629 Polyneuropathy, unspecified: Secondary | ICD-10-CM

## 2024-06-18 NOTE — Telephone Encounter (Signed)
 Complete

## 2024-06-21 ENCOUNTER — Ambulatory Visit: Admitting: Family

## 2024-06-21 ENCOUNTER — Encounter: Payer: Self-pay | Admitting: Family

## 2024-06-21 VITALS — BP 116/79 | HR 68 | Temp 98.2°F | Resp 16 | Ht 66.93 in | Wt 205.4 lb

## 2024-06-21 DIAGNOSIS — E119 Type 2 diabetes mellitus without complications: Secondary | ICD-10-CM

## 2024-06-21 DIAGNOSIS — Z0189 Encounter for other specified special examinations: Secondary | ICD-10-CM

## 2024-06-21 DIAGNOSIS — I10 Essential (primary) hypertension: Secondary | ICD-10-CM

## 2024-06-21 DIAGNOSIS — E785 Hyperlipidemia, unspecified: Secondary | ICD-10-CM

## 2024-06-21 DIAGNOSIS — G629 Polyneuropathy, unspecified: Secondary | ICD-10-CM | POA: Diagnosis not present

## 2024-06-21 DIAGNOSIS — E1165 Type 2 diabetes mellitus with hyperglycemia: Secondary | ICD-10-CM | POA: Diagnosis not present

## 2024-06-21 DIAGNOSIS — Z7984 Long term (current) use of oral hypoglycemic drugs: Secondary | ICD-10-CM

## 2024-06-21 MED ORDER — METFORMIN HCL ER 500 MG PO TB24: 500.0000 mg | ORAL_TABLET | Freq: Every day | ORAL | 0 refills | Status: AC

## 2024-06-21 MED ORDER — GABAPENTIN 300 MG PO CAPS: 300.0000 mg | ORAL_CAPSULE | Freq: Two times a day (BID) | ORAL | 0 refills | Status: AC

## 2024-06-21 MED ORDER — HYDROCHLOROTHIAZIDE 50 MG PO TABS: 50.0000 mg | ORAL_TABLET | Freq: Every day | ORAL | 0 refills | Status: AC

## 2024-06-21 MED ORDER — AMLODIPINE BESYLATE 10 MG PO TABS: 10.0000 mg | ORAL_TABLET | Freq: Every day | ORAL | 0 refills | Status: AC

## 2024-06-21 MED ORDER — ATORVASTATIN CALCIUM 20 MG PO TABS: 20.0000 mg | ORAL_TABLET | Freq: Every day | ORAL | 0 refills | Status: AC

## 2024-06-21 NOTE — Progress Notes (Signed)
 "   Patient ID: Audrey Avery, female    DOB: 03-28-1971  MRN: [id number]  CC: Chronic Conditions Follow-Up  Subjective: Audrey Avery is a 54 y.o. female who presents for chronic conditions follow-up.  Her concerns today include:  - Doing well on Amlodipine  and Hydrochlorothiazide , no issues/concerns. She does not complain of red flag symptoms such as but not limited to chest pain, shortness of breath, worst headache of life, nausea/vomiting.  - Doing well on Metformin  XR, no issues/concerns. Denies red flag symptoms associated with diabetes.  - States up to date on diabetic eye exam.  - Due for diabetic foot exam.  - Doing well on Atorvastatin , no issues/concerns.  - States doesn't feel Gabapentin  is helping as much as she would like.   Patient Active Problem List   Diagnosis Date Noted   Class 2 obesity with body mass index (BMI) of 37.0 to 37.9 in adult 12/12/2023   Hyperlipidemia 05/21/2022   Essential hypertension 10/02/2019   Syncope and collapse 09/30/2019   DOE (dyspnea on exertion) 09/30/2019   OSA (obstructive sleep apnea) 08/02/2019   Anemia 08/15/2012   Menorrhagia 08/15/2012   Refusal of blood transfusions as patient is Jehovah's Witness 08/15/2012     Medications Ordered Prior to Encounter[1]  Allergies[2]  Social History   Socioeconomic History   Marital status: Single    Spouse name: Not on file   Number of children: Not on file   Years of education: Not on file   Highest education level: Some college, no degree  Occupational History    Employer: [employer]  Tobacco Use   Smoking status: Never    Passive exposure: Past   Smokeless tobacco: Never  Vaping Use   Vaping status: Never Used  Substance and Sexual Activity   Alcohol use: Yes    Comment: rare   Drug use: No   Sexual activity: Yes    Partners: Male    Birth control/protection: Post-menopausal    Comment: menarche 54yo, sexual debut 54yo  Other Topics Concern   Not on  file  Social History Narrative   Not on file   Social Drivers of Health   Tobacco Use: Low Risk (06/21/2024)   Patient History    Smoking Tobacco Use: Never    Smokeless Tobacco Use: Never    Passive Exposure: Past  Financial Resource Strain: High Risk (06/20/2024)   Overall Financial Resource Strain (CARDIA)    Difficulty of Paying Living Expenses: Hard  Food Insecurity: Food Insecurity Present (06/21/2024)   Epic    Worried About Programme Researcher, Broadcasting/film/video in the Last Year: Sometimes true    Ran Out of Food in the Last Year: Sometimes true  Transportation Needs: No Transportation Needs (06/20/2024)   Epic    Lack of Transportation (Medical): No    Lack of Transportation (Non-Medical): No  Physical Activity: Sufficiently Active (06/20/2024)   Exercise Vital Sign    Days of Exercise per Week: 3 days    Minutes of Exercise per Session: 90 min  Stress: Stress Concern Present (06/20/2024)   [employer] of Occupational Health - Occupational Stress Questionnaire    Feeling of Stress: To some extent  Social Connections: Socially Integrated (06/20/2024)   Social Connection and Isolation Panel    Frequency of Communication with Friends and Family: More than three times a week    Frequency of Social Gatherings with Friends and Family: Never    Attends Religious Services: More than 4 times per year  Active Member of Clubs or Organizations: Yes    Attends Banker Meetings: More than 4 times per year    Marital Status: Living with partner  Intimate Partner Violence: Not At Risk (09/29/2023)   Humiliation, Afraid, Rape, and Kick questionnaire    Fear of Current or Ex-Partner: No    Emotionally Abused: No    Physically Abused: No    Sexually Abused: No  Depression (PHQ2-9): Low Risk (06/21/2024)   Depression (PHQ2-9)    PHQ-2 Score: 0  Alcohol Screen: Low Risk (06/20/2024)   Alcohol Screen    Last Alcohol Screening Score (AUDIT): 1  Housing: Low Risk (06/21/2024)   Epic    Unable to Pay  for Housing in the Last Year: No    Number of Times Moved in the Last Year: 0    Homeless in the Last Year: No  Utilities: Not At Risk (09/29/2023)   [facility] Utilities    Threatened with loss of utilities: No  Health Literacy: Adequate Health Literacy (09/29/2023)   B1300 Health Literacy    Frequency of need for help with medical instructions: Never    Family History  Problem Relation Age of Onset   Cancer Mother    Hypertension Mother    Diabetes Father    Cancer Father    Hypertension Father    Heart attack Father 41   CAD Father 69       She does not know details   Cancer Sister    Hypertension Sister    Colon polyps Sister    Colon polyps Sister    Colon cancer Neg Hx    Esophageal cancer Neg Hx    Rectal cancer Neg Hx    Stomach cancer Neg Hx     Past Surgical History:  Procedure Laterality Date   ABDOMINAL HYSTERECTOMY     BREAST SURGERY     CESAREAN SECTION     CHOLECYSTECTOMY     TRANSTHORACIC ECHOCARDIOGRAM  09/2019   EF 60 to 65%.  Mild LVH,  normal valves..  Normal echo    ROS: Review of Systems Negative except as stated above  PHYSICAL EXAM: BP 116/79   Pulse 68   Temp 98.2 F (36.8 C) (Oral)   Resp 16   Ht 5' 6.93 (1.7 m)   Wt 205 lb 6.4 oz (93.2 kg)   SpO2 98%   BMI 32.24 kg/m   Physical Exam HENT:     Head: Normocephalic and atraumatic.     Nose: Nose normal.     Mouth/Throat:     Mouth: Mucous membranes are moist.     Pharynx: Oropharynx is clear.  Eyes:     Extraocular Movements: Extraocular movements intact.     Conjunctiva/sclera: Conjunctivae normal.     Pupils: Pupils are equal, round, and reactive to light.  Cardiovascular:     Rate and Rhythm: Normal rate and regular rhythm.     Pulses: Normal pulses.     Heart sounds: Normal heart sounds.  Pulmonary:     Effort: Pulmonary effort is normal.     Breath sounds: Normal breath sounds.  Musculoskeletal:        General: Normal range of motion.     Cervical back: Normal range of  motion and neck supple.  Neurological:     General: No focal deficit present.     Mental Status: She is alert and oriented to person, place, and time.  Psychiatric:  Mood and Affect: Mood normal.        Behavior: Behavior normal.     ASSESSMENT AND PLAN: 1. Primary hypertension (Primary) - Continue Amlodipine  and Hydrochlorothiazide  as prescribed.  - Counseled on blood pressure goal of less than 130/80, low-sodium, DASH diet, medication compliance, and 150 minutes of moderate intensity exercise per week as tolerated. Counseled on medication adherence and adverse effects. - Follow-up with primary provider in 3 months or sooner if needed.  - amLODipine  (NORVASC ) 10 MG tablet; Take 1 tablet (10 mg total) by mouth daily.  Dispense: 90 tablet; Refill: 0 - hydrochlorothiazide  (HYDRODIURIL ) 50 MG tablet; Take 1 tablet (50 mg total) by mouth daily.  Dispense: 90 tablet; Refill: 0  2. Type 2 diabetes mellitus with hyperglycemia, without long-term current use of insulin (HCC) - Hemoglobin A1c result pending.  - Continue Metformin  XR as prescribed.  - Routine screening.  - Discussed the importance of healthy eating habits, low-carbohydrate diet, low-sugar diet, regular aerobic exercise (at least 150 minutes a week as tolerated) and medication compliance to achieve or maintain control of diabetes. Counseled on medication adherence/adverse effects.  - Follow-up with primary provider as scheduled.  - Microalbumin / creatinine urine ratio - Hemoglobin A1c - metFORMIN  (GLUCOPHAGE -XR) 500 MG 24 hr tablet; Take 1 tablet (500 mg total) by mouth daily with breakfast.  Dispense: 90 tablet; Refill: 0  3. Diabetic eye exam [facility]) - Patient reports up to date.   4. Encounter for diabetic foot exam (HCC) - Referral to Podiatry for evaluation/management.  - Ambulatory referral to Podiatry  5. Hyperlipidemia, unspecified hyperlipidemia type - Continue Atorvastatin  as prescribed. Counseled on medication  adherence/adverse effects.  - Follow-up with primary provider in 3 months or sooner if needed. - atorvastatin  (LIPITOR) 20 MG tablet; Take 1 tablet (20 mg total) by mouth daily.  Dispense: 90 tablet; Refill: 0  6. Neuropathy - Increase Gabapentin  from 300 mg once daily to 300 mg twice daily as prescribed. Counseled on medication adherence/adverse effects.  - Follow-up with primary provider in 4 weeks or sooner if needed. - gabapentin  (NEURONTIN ) 300 MG capsule; Take 1 capsule (300 mg total) by mouth 2 (two) times daily.  Dispense: 180 capsule; Refill: 0   Patient was given the opportunity to ask questions.  Patient verbalized understanding of the plan and was able to repeat key elements of the plan. Patient was given clear instructions to go to Emergency Department or return to medical center if symptoms don't improve, worsen, or new problems develop.The patient verbalized understanding.   Orders Placed This Encounter  Procedures   Microalbumin / creatinine urine ratio   Hemoglobin A1c   Ambulatory referral to Podiatry     Requested Prescriptions   Signed Prescriptions Disp Refills   amLODipine  (NORVASC ) 10 MG tablet 90 tablet 0    Sig: Take 1 tablet (10 mg total) by mouth daily.   atorvastatin  (LIPITOR) 20 MG tablet 90 tablet 0    Sig: Take 1 tablet (20 mg total) by mouth daily.   gabapentin  (NEURONTIN ) 300 MG capsule 180 capsule 0    Sig: Take 1 capsule (300 mg total) by mouth 2 (two) times daily.   hydrochlorothiazide  (HYDRODIURIL ) 50 MG tablet 90 tablet 0    Sig: Take 1 tablet (50 mg total) by mouth daily.   metFORMIN  (GLUCOPHAGE -XR) 500 MG 24 hr tablet 90 tablet 0    Sig: Take 1 tablet (500 mg total) by mouth daily with breakfast.    Return in about 3 months (  around 09/19/2024) for Follow-Up or next available chronic conditions.  Greig JINNY Chute, NP      [1]  Current Outpatient Medications on File Prior to Visit  Medication Sig Dispense Refill   estradiol  (VIVELLE -DOT)  0.1 MG/24HR patch Place 1 patch (0.1 mg total) onto the skin 2 (two) times a week. 8 patch 12   nystatin  (MYCOSTATIN /NYSTOP ) powder Apply 1 Application topically 3 (three) times daily. 60 g 4   tirzepatide  (MOUNJARO ) 12.5 MG/0.5ML Pen Inject 12.5 mg into the skin once a week. 2 mL 1   No current facility-administered medications on file prior to visit.  [2]  Allergies Allergen Reactions   Penicillins Anaphylaxis and Swelling    Has patient had a PCN reaction causing immediate rash, facial/tongue/throat swelling, SOB or lightheadedness with hypotension: Yes Has patient had a PCN reaction causing severe rash involving mucus membranes or skin necrosis: No Has patient had a PCN reaction that required hospitalization No Has patient had a PCN reaction occurring within the last 10 years: No If all of the above answers are NO, then may proceed with Cephalosporin use.    "

## 2024-06-28 ENCOUNTER — Encounter: Payer: Self-pay | Admitting: Radiology

## 2024-06-28 ENCOUNTER — Ambulatory Visit: Admitting: Radiology

## 2024-06-28 ENCOUNTER — Telehealth: Payer: Self-pay | Admitting: Family

## 2024-06-28 ENCOUNTER — Ambulatory Visit: Payer: Self-pay | Admitting: Family

## 2024-06-28 VITALS — BP 112/80 | HR 74 | Wt 204.0 lb

## 2024-06-28 DIAGNOSIS — G4733 Obstructive sleep apnea (adult) (pediatric): Secondary | ICD-10-CM

## 2024-06-28 DIAGNOSIS — E119 Type 2 diabetes mellitus without complications: Secondary | ICD-10-CM

## 2024-06-28 LAB — SPECIMEN STATUS REPORT

## 2024-06-28 LAB — MICROALBUMIN / CREATININE URINE RATIO
Creatinine, Urine: 275.2 mg/dL
Microalb/Creat Ratio: 11 mg/g{creat} (ref 0–29)
Microalbumin, Urine: 29.4 ug/mL

## 2024-06-28 LAB — HEMOGLOBIN A1C
Est. average glucose Bld gHb Est-mCnc: 123 mg/dL
Hgb A1c MFr Bld: 5.9 % — ABNORMAL HIGH (ref 4.8–5.6)

## 2024-06-28 MED ORDER — MOUNJARO 15 MG/0.5ML ~~LOC~~ SOAJ: 15.0000 mg | SUBCUTANEOUS | 1 refills | Status: AC

## 2024-06-28 NOTE — Telephone Encounter (Unsigned)
 Copied from CRM [id number]. Topic: Clinical - Lab/Test Results >> Jun 28, 2024  1:58 PM Tiffini S wrote: Reason for CRM: Janelle with [facility] called asking to talk with the office about specimen that they have received  wants to asked if a test is needed   Please call back at [phone]

## 2024-06-28 NOTE — Progress Notes (Signed)
" ° °  Audrey Avery 11/13/1970 [id number]   History:  54 y.o. G4P2 presents for medication management. Was started on Mounjaro  for DM. Doing well, no side effects. Has lost 27lbs. Continues to eat well and exercise. No episodes of hyper or hypoglycemia.  Gynecologic History No LMP recorded. Patient has had a hysterectomy.   Obstetric History OB History  Gravida Para Term Preterm AB Living  4 2    2   SAB IAB Ectopic Multiple Live Births      2    # Outcome Date GA Lbr Len/2nd Weight Sex Type Anes PTL Lv  4 Gravida           3 Gravida           2 Para           1 Para                06/21/2024   11:17 AM 03/20/2024   10:33 AM 12/28/2023   11:17 AM  Depression screen PHQ 2/9  Decreased Interest 0 0 0  Down, Depressed, Hopeless 0 0 0  PHQ - 2 Score 0 0 0     The following portions of the patient's history were reviewed and updated as appropriate: allergies, current medications, past family history, past medical history, past social history, past surgical history, and problem list.  Review of Systems  All other systems reviewed and are negative.   Past medical history, past surgical history, family history and social history were all reviewed and documented in the EPIC chart.  Exam:  Vitals:   06/28/24 0937  BP: 112/80  Pulse: 74  SpO2: 97%  Weight: 204 lb (92.5 kg)   Body mass index is 32.02 kg/m.  Physical Exam Constitutional:      Appearance: Normal appearance. She is obese.  Pulmonary:     Effort: Pulmonary effort is normal.  Neurological:     Mental Status: She is alert.  Psychiatric:        Mood and Affect: Mood normal.        Thought Content: Thought content normal.        Judgment: Judgment normal.      Darice Hoit, CMA present for exam  Starting weight:231 Current weight:204 First goal:200 Overall weight loss goal:170  Assessment/Plan:   1. Diabetes mellitus type 2, noninsulin dependent (HCC) (Primary) - tirzepatide  (MOUNJARO ) 15 MG/0.5ML  Pen; Inject 15 mg into the skin once a week.  Dispense: 6 mL; Refill: 1  2. Obstructive sleep apnea syndrome - tirzepatide  (MOUNJARO ) 15 MG/0.5ML Pen; Inject 15 mg into the skin once a week.  Dispense: 6 mL; Refill: 1   Risks and benefits discussed. Continue to focus on protein and fiber. Small frequent meals. Adequate hydration and electrolyte replacement. Will continue to exercise for at least a week including weight bearing exercise.   Return in about 3 months (around 09/26/2024) for Med Follow-up.  Dedee Liss B WHNP-BC 9:55 AM 06/28/2024  "

## 2024-07-01 NOTE — Telephone Encounter (Signed)
 [facility] d/c blood

## 2024-07-12 ENCOUNTER — Ambulatory Visit

## 2024-07-12 ENCOUNTER — Ambulatory Visit: Admitting: Podiatry

## 2024-07-12 DIAGNOSIS — Q666 Other congenital valgus deformities of feet: Secondary | ICD-10-CM

## 2024-07-12 DIAGNOSIS — M21611 Bunion of right foot: Secondary | ICD-10-CM

## 2024-07-12 DIAGNOSIS — Z01818 Encounter for other preprocedural examination: Secondary | ICD-10-CM | POA: Diagnosis not present

## 2024-07-12 DIAGNOSIS — M21619 Bunion of unspecified foot: Secondary | ICD-10-CM

## 2024-07-12 NOTE — Progress Notes (Signed)
 "  Subjective:  Patient ID: Audrey Avery, female    DOB: [birth year],  MRN: [id number]  Chief Complaint  Patient presents with   Diabetes    Pt is here for a foot exam    54 y.o. female presents with the above complaint.  Patient presents with continued complaint of the right bunion deformity.  She states that has been causing him a lot of her a lot of discomfort.  She has tried shoe gear modification padding offloading none of which has helped.  She wishes to discuss surgical intervention at this time.  She also has secondary complaint of orthotics as well.  She currently does not wear any shoe would like to obtain orthotics as well pain scale is 7 out of 10 dull aching nature hurts with ambulation worse with pressure  Review of Systems: Negative except as noted in the HPI. Denies N/V/F/Ch.  Past Medical History:  Diagnosis Date   Anemia    Chronic cough 05/2018   With intermittent cough related syncope   Diabetes mellitus without complication (HCC)    Hyperlipidemia    Hypertension    Morbid obesity with BMI of 40.0-44.9, adult (HCC)    Obstructive sleep apnea of adult 2021   Recently diagnosed.  AHI 127.8, O2 nadir 51% (severe OSA) CPAP titration pending   PSVT (paroxysmal supraventricular tachycardia) 09/2019   Cardiac monitor showed 62 episodes of short bursts of SVT ranging from 4-19 beats.   Sleep apnea    Syncope, tussive    Current Medications[1]  Tobacco Use History[2]  Allergies[3] Objective:  There were no vitals filed for this visit. There is no height or weight on file to calculate BMI. Constitutional Well developed. Well nourished.  Vascular Dorsalis pedis pulses palpable bilaterally. Posterior tibial pulses palpable bilaterally. Capillary refill normal to all digits.  No cyanosis or clubbing noted. Pedal hair growth normal.  Neurologic Normal speech. Oriented to person, place, and time. Epicritic sensation to light touch grossly present bilaterally.   Dermatologic Nails well groomed and normal in appearance. No open wounds. No skin lesions.  Orthopedic: Normal joint ROM without pain or crepitus bilaterally. Hallux abductovalgus deformity present.  Track bound not a tracking deformity.  No first MPJ pain noted no intra-articular first MPJ pain noted.  Hypertrophy of the medial eminence noted Left 1st MPJ diminished range of motion. Left 1st TMT without gross hypermobility. Right 1st MPJ diminished range of motion  Right 1st TMT without gross hypermobility. Lesser digital contractures absent bilaterally.   Radiographs: Taken and reviewed. Hallux abductovalgus deformity present. Metatarsal parabola normal. 1st/2nd IMA: Moderate; TSP: 5 out of 7  Assessment:   1. Bunion   2. Pes planovalgus    Plan:  Patient was evaluated and treated and all questions answered.  Hallux abductovalgus deformity, right -XR as above. -Patient has failed all conservative therapy and wishes to proceed with surgical intervention. All risks, benefits, and alternatives discussed with patient. No guarantees given. Consent reviewed and signed by patient. Post-op course explained at length. -Planned procedures: Right foot metatarsal osteotomy with minimally invasive surgery with possible phalangeal osteotomy -Risk factors: None - I discussed my preoperative IntraOp postoperative plan with the patient in extensive detail she states understanding like to proceed with surgery.  She has failed all conservative care listed above. -Informed surgical risk consent was reviewed and read aloud to the patient.  I reviewed the films.  I have discussed my findings with the patient in great detail.  I have discussed  all risks including but not limited to infection, stiffness, scarring, limp, disability, deformity, damage to blood vessels and nerves, numbness, poor healing, need for braces, arthritis, chronic pain, amputation, death.  All benefits and realistic expectations  discussed in great detail.  I have made no promises as to the outcome.  I have provided realistic expectations.  I have offered the patient a 2nd opinion, which they have declined and assured me they preferred to proceed despite the risks   Pes planovalgus/foot deformity -I explained to patient the etiology of pes planovalgus and relationship with heel pain/arch pain and various treatment options were discussed.  Given patient foot structure in the setting of heel pain/arch pain I believe patient will benefit from custom-made orthotics to help control the hindfoot motion support the arch of the foot and take the stress away from arches.  Patient agrees with the plan like to proceed with orthotics -Patient was casted for orthotics   No follow-ups on file.     [1]  Current Outpatient Medications:    amLODipine  (NORVASC ) 10 MG tablet, Take 1 tablet (10 mg total) by mouth daily., Disp: 90 tablet, Rfl: 0   atorvastatin  (LIPITOR) 20 MG tablet, Take 1 tablet (20 mg total) by mouth daily., Disp: 90 tablet, Rfl: 0   estradiol  (VIVELLE -DOT) 0.1 MG/24HR patch, Place 1 patch (0.1 mg total) onto the skin 2 (two) times a week., Disp: 8 patch, Rfl: 12   gabapentin  (NEURONTIN ) 300 MG capsule, Take 1 capsule (300 mg total) by mouth 2 (two) times daily., Disp: 180 capsule, Rfl: 0   hydrochlorothiazide  (HYDRODIURIL ) 50 MG tablet, Take 1 tablet (50 mg total) by mouth daily., Disp: 90 tablet, Rfl: 0   metFORMIN  (GLUCOPHAGE -XR) 500 MG 24 hr tablet, Take 1 tablet (500 mg total) by mouth daily with breakfast., Disp: 90 tablet, Rfl: 0   nystatin  (MYCOSTATIN /NYSTOP ) powder, Apply 1 Application topically 3 (three) times daily., Disp: 60 g, Rfl: 4   tirzepatide  (MOUNJARO ) 15 MG/0.5ML Pen, Inject 15 mg into the skin once a week., Disp: 6 mL, Rfl: 1 [2]  Social History Tobacco Use  Smoking Status Never   Passive exposure: Past  Smokeless Tobacco Never  [3]  Allergies Allergen Reactions   Penicillins Anaphylaxis and  Swelling    Has patient had a PCN reaction causing immediate rash, facial/tongue/throat swelling, SOB or lightheadedness with hypotension: Yes Has patient had a PCN reaction causing severe rash involving mucus membranes or skin necrosis: No Has patient had a PCN reaction that required hospitalization No Has patient had a PCN reaction occurring within the last 10 years: No If all of the above answers are NO, then may proceed with Cephalosporin use.    "

## 2024-07-12 NOTE — Patient Instructions (Signed)

## 2024-07-17 ENCOUNTER — Other Ambulatory Visit: Payer: Self-pay | Admitting: Podiatry

## 2024-07-17 DIAGNOSIS — Z01818 Encounter for other preprocedural examination: Secondary | ICD-10-CM

## 2024-07-17 DIAGNOSIS — M21619 Bunion of unspecified foot: Secondary | ICD-10-CM

## 2024-07-17 DIAGNOSIS — Q666 Other congenital valgus deformities of feet: Secondary | ICD-10-CM

## 2024-07-18 ENCOUNTER — Encounter: Payer: Self-pay | Admitting: Family

## 2024-07-19 ENCOUNTER — Other Ambulatory Visit: Payer: Self-pay | Admitting: Radiology

## 2024-07-19 DIAGNOSIS — E119 Type 2 diabetes mellitus without complications: Secondary | ICD-10-CM

## 2024-07-19 DIAGNOSIS — G4733 Obstructive sleep apnea (adult) (pediatric): Secondary | ICD-10-CM

## 2024-07-19 NOTE — Telephone Encounter (Signed)
 At last Visit JC moved pt's up to 15 Mgs on the Mounjaro .   RX has been denied.

## 2024-08-23 ENCOUNTER — Encounter: Admitting: Primary Care

## 2024-09-20 ENCOUNTER — Ambulatory Visit: Payer: Self-pay | Admitting: Family

## 2024-09-27 ENCOUNTER — Ambulatory Visit: Admitting: Radiology
